# Patient Record
Sex: Female | Born: 1940 | Race: White | Hispanic: Refuse to answer | Marital: Married | Smoking: Former smoker
Health system: Southern US, Community
[De-identification: ages and names within clinical notes are randomized; demographics above are authoritative.]

## PROBLEM LIST (undated history)

## (undated) DIAGNOSIS — I509 Heart failure, unspecified: Secondary | ICD-10-CM

## (undated) DIAGNOSIS — E0500 Thyrotoxicosis with diffuse goiter without thyrotoxic crisis or storm: Secondary | ICD-10-CM

## (undated) DIAGNOSIS — Z923 Personal history of irradiation: Secondary | ICD-10-CM

## (undated) HISTORY — PX: MASTECTOMY: SHX3

## (undated) HISTORY — PX: BREAST EXCISIONAL BIOPSY: SUR124

---

## 1993-05-19 HISTORY — PX: BREAST LUMPECTOMY: SHX2

## 2011-07-07 ENCOUNTER — Ambulatory Visit: Payer: Medicare PPO

## 2011-07-07 ENCOUNTER — Ambulatory Visit: Payer: Medicare PPO | Admitting: Family Medicine

## 2011-07-07 DIAGNOSIS — M79673 Pain in unspecified foot: Secondary | ICD-10-CM

## 2011-07-07 DIAGNOSIS — M7740 Metatarsalgia, unspecified foot: Secondary | ICD-10-CM

## 2011-07-07 DIAGNOSIS — M7750 Other enthesopathy of unspecified foot: Secondary | ICD-10-CM

## 2011-07-07 DIAGNOSIS — M79609 Pain in unspecified limb: Secondary | ICD-10-CM

## 2011-07-07 NOTE — Progress Notes (Signed)
  Subjective:    Patient ID: Carla Petersen, female    DOB: 1940/07/08, 71 y.o.   MRN: [id number]  HPI Carla Petersen is a 71 y.o. female C/o 1 week hx R mid-distal foot pain.  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  No know ninjury, but was sitting for prolonged times, and more walking than usual.  Noticed swelling in distal foot.  No wound/echymosis.  No hx gout.  No prior foot injury/surgery. 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  No calf pain/swelling, no chest pain, no dyspnea.  Tx: ice, elevation, tylenol, Alleve - min relief, but swelling less now than yesterday.      Review of Systems  Constitutional: Positive for activity change. Negative for fever.  Respiratory: Negative for shortness of breath.   Cardiovascular: Negative for chest pain, palpitations and leg swelling.  Musculoskeletal: Positive for joint swelling.  Skin: Negative for rash and wound.       Objective:   Physical Exam  Constitutional: She is oriented to person, place, and time. She appears well-developed and well-nourished.  HENT:  Head: Normocephalic and atraumatic.  Pulmonary/Chest: Effort normal.  Musculoskeletal: She exhibits edema and tenderness.       Right ankle: She exhibits swelling. She exhibits no deformity. tenderness. No lateral malleolus, no medial malleolus, no AITFL, no head of 5th metatarsal and no proximal fibula tenderness found.       Right lower leg: She exhibits no tenderness and no edema.       Feet:       No calf ttp, negative homans   Neurological: She is alert and oriented to person, place, and time. No sensory deficit.  Skin: Skin is warm and dry. No rash noted. No erythema. No pallor.  Psychiatric: She has a normal mood and affect. Her behavior is normal.   [facility] reading (PRIMARY) by  Dr. Neva Seat: R foot: negative      Assessment & Plan:  Carla Petersen is a 71 y.o. female With R midfoot pain/metatarsalgia.  Possible overuse vs early stress injury, vs DJD  flair. Has had slight improvement in swelling today.  Post op shoe, XR to overread.  Elevate, ice prn if swelling, otc alleve prn  Return to the clinic or go to the nearest emergency room if symptoms worsen or new symptoms occur.

## 2011-07-07 NOTE — Patient Instructions (Signed)
Elevate foot, wear postop shoe. Tylenol over the counter as needed - let me know if something stronger is needed.  Return to the clinic or go to the nearest emergency room if any of your symptoms worsen or new symptoms occur, including any chest pain or shortness of breath.

## 2011-07-09 ENCOUNTER — Telehealth: Payer: Self-pay | Admitting: Radiology

## 2011-07-09 NOTE — Telephone Encounter (Signed)
Message copied by Levon Hedger A on Wed Jul 09, 2011  4:10 PM ------      Message from: [place], [place] R      Created: Wed Jul 09, 2011  4:05 PM       Call pt - radiologist noted arthritis at big toe joint, but no fracture.  If not improving can follow up next week.

## 2011-07-09 NOTE — Telephone Encounter (Signed)
Gave pt results of xray. Pt understood.

## 2014-04-24 ENCOUNTER — Other Ambulatory Visit: Payer: Self-pay | Admitting: Internal Medicine

## 2014-04-24 DIAGNOSIS — R748 Abnormal levels of other serum enzymes: Secondary | ICD-10-CM

## 2014-05-01 ENCOUNTER — Ambulatory Visit
Admission: RE | Admit: 2014-05-01 | Discharge: 2014-05-01 | Disposition: A | Discharge status: Home / Self Care | Payer: Self-pay | Source: Ambulatory Visit | Attending: Internal Medicine | Admitting: Internal Medicine

## 2014-05-01 ENCOUNTER — Encounter (INDEPENDENT_AMBULATORY_CARE_PROVIDER_SITE_OTHER): Payer: Self-pay

## 2014-05-01 DIAGNOSIS — R748 Abnormal levels of other serum enzymes: Secondary | ICD-10-CM

## 2014-07-07 DIAGNOSIS — M1990 Unspecified osteoarthritis, unspecified site: Secondary | ICD-10-CM | POA: Diagnosis present

## 2014-07-07 DIAGNOSIS — M542 Cervicalgia: Secondary | ICD-10-CM | POA: Diagnosis not present

## 2014-07-07 DIAGNOSIS — S32402D Unspecified fracture of left acetabulum, subsequent encounter for fracture with routine healing: Secondary | ICD-10-CM | POA: Diagnosis not present

## 2014-07-07 DIAGNOSIS — S32402A Unspecified fracture of left acetabulum, initial encounter for closed fracture: Secondary | ICD-10-CM | POA: Diagnosis not present

## 2014-07-07 DIAGNOSIS — M79651 Pain in right thigh: Secondary | ICD-10-CM | POA: Diagnosis not present

## 2014-07-07 DIAGNOSIS — S2241XA Multiple fractures of ribs, right side, initial encounter for closed fracture: Secondary | ICD-10-CM | POA: Diagnosis not present

## 2014-07-07 DIAGNOSIS — R52 Pain, unspecified: Secondary | ICD-10-CM | POA: Diagnosis not present

## 2014-07-07 DIAGNOSIS — S2232XA Fracture of one rib, left side, initial encounter for closed fracture: Secondary | ICD-10-CM | POA: Diagnosis not present

## 2014-07-07 DIAGNOSIS — S32409A Unspecified fracture of unspecified acetabulum, initial encounter for closed fracture: Secondary | ICD-10-CM | POA: Diagnosis not present

## 2014-07-07 DIAGNOSIS — Z85038 Personal history of other malignant neoplasm of large intestine: Secondary | ICD-10-CM | POA: Diagnosis not present

## 2014-07-07 DIAGNOSIS — S022XXA Fracture of nasal bones, initial encounter for closed fracture: Secondary | ICD-10-CM | POA: Diagnosis not present

## 2014-07-07 DIAGNOSIS — S32502A Unspecified fracture of left pubis, initial encounter for closed fracture: Secondary | ICD-10-CM | POA: Diagnosis not present

## 2014-07-07 DIAGNOSIS — S0512XA Contusion of eyeball and orbital tissues, left eye, initial encounter: Secondary | ICD-10-CM | POA: Diagnosis present

## 2014-07-07 DIAGNOSIS — I10 Essential (primary) hypertension: Secondary | ICD-10-CM | POA: Diagnosis present

## 2014-07-07 DIAGNOSIS — S32482A Displaced dome fracture of left acetabulum, initial encounter for closed fracture: Secondary | ICD-10-CM | POA: Diagnosis not present

## 2014-07-07 DIAGNOSIS — S7012XA Contusion of left thigh, initial encounter: Secondary | ICD-10-CM | POA: Diagnosis present

## 2014-07-07 DIAGNOSIS — M810 Age-related osteoporosis without current pathological fracture: Secondary | ICD-10-CM | POA: Diagnosis present

## 2014-07-07 DIAGNOSIS — S0003XA Contusion of scalp, initial encounter: Secondary | ICD-10-CM | POA: Diagnosis present

## 2014-07-07 DIAGNOSIS — I517 Cardiomegaly: Secondary | ICD-10-CM | POA: Diagnosis not present

## 2014-07-07 DIAGNOSIS — S32509A Unspecified fracture of unspecified pubis, initial encounter for closed fracture: Secondary | ICD-10-CM | POA: Diagnosis not present

## 2014-07-07 DIAGNOSIS — D62 Acute posthemorrhagic anemia: Secondary | ICD-10-CM | POA: Diagnosis not present

## 2014-07-07 DIAGNOSIS — J342 Deviated nasal septum: Secondary | ICD-10-CM | POA: Diagnosis present

## 2014-07-07 DIAGNOSIS — S32592A Other specified fracture of left pubis, initial encounter for closed fracture: Secondary | ICD-10-CM | POA: Diagnosis not present

## 2014-07-07 DIAGNOSIS — S060X1A Concussion with loss of consciousness of 30 minutes or less, initial encounter: Secondary | ICD-10-CM | POA: Diagnosis not present

## 2014-07-07 DIAGNOSIS — E0500 Thyrotoxicosis with diffuse goiter without thyrotoxic crisis or storm: Secondary | ICD-10-CM | POA: Diagnosis not present

## 2014-07-07 DIAGNOSIS — S0990XA Unspecified injury of head, initial encounter: Secondary | ICD-10-CM | POA: Diagnosis not present

## 2014-07-07 DIAGNOSIS — Z853 Personal history of malignant neoplasm of breast: Secondary | ICD-10-CM | POA: Diagnosis not present

## 2014-07-07 DIAGNOSIS — F17201 Nicotine dependence, unspecified, in remission: Secondary | ICD-10-CM | POA: Diagnosis present

## 2014-07-07 DIAGNOSIS — D500 Iron deficiency anemia secondary to blood loss (chronic): Secondary | ICD-10-CM | POA: Diagnosis not present

## 2014-07-07 DIAGNOSIS — S2239XA Fracture of one rib, unspecified side, initial encounter for closed fracture: Secondary | ICD-10-CM | POA: Diagnosis not present

## 2014-07-07 DIAGNOSIS — S2243XD Multiple fractures of ribs, bilateral, subsequent encounter for fracture with routine healing: Secondary | ICD-10-CM | POA: Diagnosis not present

## 2014-07-07 DIAGNOSIS — S0181XA Laceration without foreign body of other part of head, initial encounter: Secondary | ICD-10-CM | POA: Diagnosis not present

## 2014-07-07 DIAGNOSIS — S060X1D Concussion with loss of consciousness of 30 minutes or less, subsequent encounter: Secondary | ICD-10-CM | POA: Diagnosis not present

## 2014-07-07 DIAGNOSIS — S2243XA Multiple fractures of ribs, bilateral, initial encounter for closed fracture: Secondary | ICD-10-CM | POA: Diagnosis not present

## 2014-07-07 DIAGNOSIS — K5900 Constipation, unspecified: Secondary | ICD-10-CM | POA: Diagnosis present

## 2014-07-07 DIAGNOSIS — S0180XA Unspecified open wound of other part of head, initial encounter: Secondary | ICD-10-CM | POA: Diagnosis not present

## 2014-07-07 DIAGNOSIS — H3530 Unspecified macular degeneration: Secondary | ICD-10-CM | POA: Diagnosis present

## 2014-07-07 DIAGNOSIS — S3210XA Unspecified fracture of sacrum, initial encounter for closed fracture: Secondary | ICD-10-CM | POA: Diagnosis not present

## 2014-07-07 DIAGNOSIS — J9811 Atelectasis: Secondary | ICD-10-CM | POA: Diagnosis not present

## 2014-07-07 DIAGNOSIS — S3289XB Fracture of other parts of pelvis, initial encounter for open fracture: Secondary | ICD-10-CM | POA: Diagnosis not present

## 2014-07-07 DIAGNOSIS — M1612 Unilateral primary osteoarthritis, left hip: Secondary | ICD-10-CM | POA: Diagnosis not present

## 2014-07-07 DIAGNOSIS — S022XXD Fracture of nasal bones, subsequent encounter for fracture with routine healing: Secondary | ICD-10-CM | POA: Diagnosis not present

## 2014-07-07 DIAGNOSIS — S32492A Other specified fracture of left acetabulum, initial encounter for closed fracture: Secondary | ICD-10-CM | POA: Diagnosis not present

## 2014-07-07 DIAGNOSIS — S2242XA Multiple fractures of ribs, left side, initial encounter for closed fracture: Secondary | ICD-10-CM | POA: Diagnosis not present

## 2014-07-07 DIAGNOSIS — M62831 Muscle spasm of calf: Secondary | ICD-10-CM | POA: Diagnosis present

## 2014-07-07 DIAGNOSIS — J90 Pleural effusion, not elsewhere classified: Secondary | ICD-10-CM | POA: Diagnosis not present

## 2014-07-07 DIAGNOSIS — F0781 Postconcussional syndrome: Secondary | ICD-10-CM | POA: Diagnosis not present

## 2014-07-07 DIAGNOSIS — S7292XA Unspecified fracture of left femur, initial encounter for closed fracture: Secondary | ICD-10-CM | POA: Diagnosis not present

## 2014-07-07 DIAGNOSIS — Z23 Encounter for immunization: Secondary | ICD-10-CM | POA: Diagnosis not present

## 2014-07-07 DIAGNOSIS — T1490 Injury, unspecified: Secondary | ICD-10-CM | POA: Diagnosis not present

## 2014-07-07 DIAGNOSIS — S7011XA Contusion of right thigh, initial encounter: Secondary | ICD-10-CM | POA: Diagnosis present

## 2014-07-07 DIAGNOSIS — Z79899 Other long term (current) drug therapy: Secondary | ICD-10-CM | POA: Diagnosis not present

## 2014-07-07 DIAGNOSIS — S0511XA Contusion of eyeball and orbital tissues, right eye, initial encounter: Secondary | ICD-10-CM | POA: Diagnosis present

## 2014-07-14 DIAGNOSIS — Z7409 Other reduced mobility: Secondary | ICD-10-CM | POA: Diagnosis not present

## 2014-07-14 DIAGNOSIS — S0292XA Unspecified fracture of facial bones, initial encounter for closed fracture: Secondary | ICD-10-CM | POA: Diagnosis not present

## 2014-07-14 DIAGNOSIS — M545 Low back pain: Secondary | ICD-10-CM | POA: Diagnosis not present

## 2014-07-14 DIAGNOSIS — S06301D Unspecified focal traumatic brain injury with loss of consciousness of 30 minutes or less, subsequent encounter: Secondary | ICD-10-CM | POA: Diagnosis not present

## 2014-07-14 DIAGNOSIS — S7292XA Unspecified fracture of left femur, initial encounter for closed fracture: Secondary | ICD-10-CM | POA: Diagnosis not present

## 2014-07-14 DIAGNOSIS — S0292XD Unspecified fracture of facial bones, subsequent encounter for fracture with routine healing: Secondary | ICD-10-CM | POA: Diagnosis not present

## 2014-07-14 DIAGNOSIS — S060X1D Concussion with loss of consciousness of 30 minutes or less, subsequent encounter: Secondary | ICD-10-CM | POA: Diagnosis not present

## 2014-07-14 DIAGNOSIS — F419 Anxiety disorder, unspecified: Secondary | ICD-10-CM | POA: Diagnosis not present

## 2014-07-14 DIAGNOSIS — S0003XD Contusion of scalp, subsequent encounter: Secondary | ICD-10-CM | POA: Diagnosis not present

## 2014-07-14 DIAGNOSIS — S2243XA Multiple fractures of ribs, bilateral, initial encounter for closed fracture: Secondary | ICD-10-CM | POA: Diagnosis not present

## 2014-07-14 DIAGNOSIS — S32592A Other specified fracture of left pubis, initial encounter for closed fracture: Secondary | ICD-10-CM | POA: Diagnosis not present

## 2014-07-14 DIAGNOSIS — F0781 Postconcussional syndrome: Secondary | ICD-10-CM | POA: Diagnosis present

## 2014-07-14 DIAGNOSIS — S0181XA Laceration without foreign body of other part of head, initial encounter: Secondary | ICD-10-CM | POA: Diagnosis not present

## 2014-07-14 DIAGNOSIS — S060X0A Concussion without loss of consciousness, initial encounter: Secondary | ICD-10-CM | POA: Diagnosis not present

## 2014-07-14 DIAGNOSIS — K5900 Constipation, unspecified: Secondary | ICD-10-CM | POA: Diagnosis not present

## 2014-07-14 DIAGNOSIS — S060X9A Concussion with loss of consciousness of unspecified duration, initial encounter: Secondary | ICD-10-CM | POA: Diagnosis not present

## 2014-07-14 DIAGNOSIS — D649 Anemia, unspecified: Secondary | ICD-10-CM | POA: Diagnosis not present

## 2014-07-14 DIAGNOSIS — S3282XA Multiple fractures of pelvis without disruption of pelvic ring, initial encounter for closed fracture: Secondary | ICD-10-CM | POA: Diagnosis not present

## 2014-07-14 DIAGNOSIS — S32402D Unspecified fracture of left acetabulum, subsequent encounter for fracture with routine healing: Secondary | ICD-10-CM | POA: Diagnosis not present

## 2014-07-14 DIAGNOSIS — M533 Sacrococcygeal disorders, not elsewhere classified: Secondary | ICD-10-CM | POA: Diagnosis not present

## 2014-07-14 DIAGNOSIS — T07 Unspecified multiple injuries: Secondary | ICD-10-CM | POA: Diagnosis not present

## 2014-07-14 DIAGNOSIS — S0181XD Laceration without foreign body of other part of head, subsequent encounter: Secondary | ICD-10-CM | POA: Diagnosis not present

## 2014-07-14 DIAGNOSIS — D500 Iron deficiency anemia secondary to blood loss (chronic): Secondary | ICD-10-CM | POA: Diagnosis present

## 2014-07-14 DIAGNOSIS — R5383 Other fatigue: Secondary | ICD-10-CM | POA: Diagnosis not present

## 2014-07-14 DIAGNOSIS — S2243XD Multiple fractures of ribs, bilateral, subsequent encounter for fracture with routine healing: Secondary | ICD-10-CM | POA: Diagnosis not present

## 2014-07-14 DIAGNOSIS — E663 Overweight: Secondary | ICD-10-CM | POA: Diagnosis present

## 2014-07-14 DIAGNOSIS — E039 Hypothyroidism, unspecified: Secondary | ICD-10-CM | POA: Diagnosis not present

## 2014-07-14 DIAGNOSIS — S7292XD Unspecified fracture of left femur, subsequent encounter for closed fracture with routine healing: Secondary | ICD-10-CM | POA: Diagnosis not present

## 2014-07-14 DIAGNOSIS — S022XXD Fracture of nasal bones, subsequent encounter for fracture with routine healing: Secondary | ICD-10-CM | POA: Diagnosis not present

## 2014-07-17 DIAGNOSIS — T07XXXA Unspecified multiple injuries, initial encounter: Secondary | ICD-10-CM | POA: Insufficient documentation

## 2014-08-07 DIAGNOSIS — S2249XA Multiple fractures of ribs, unspecified side, initial encounter for closed fracture: Secondary | ICD-10-CM | POA: Diagnosis not present

## 2014-08-10 DIAGNOSIS — S32492D Other specified fracture of left acetabulum, subsequent encounter for fracture with routine healing: Secondary | ICD-10-CM | POA: Diagnosis not present

## 2014-08-10 DIAGNOSIS — S32810D Multiple fractures of pelvis with stable disruption of pelvic ring, subsequent encounter for fracture with routine healing: Secondary | ICD-10-CM | POA: Diagnosis not present

## 2014-08-10 DIAGNOSIS — S32472D Displaced fracture of medial wall of left acetabulum, subsequent encounter for fracture with routine healing: Secondary | ICD-10-CM | POA: Diagnosis not present

## 2014-08-10 DIAGNOSIS — Z880 Allergy status to penicillin: Secondary | ICD-10-CM | POA: Diagnosis not present

## 2014-08-10 DIAGNOSIS — S32472A Displaced fracture of medial wall of left acetabulum, initial encounter for closed fracture: Secondary | ICD-10-CM | POA: Diagnosis not present

## 2014-08-10 DIAGNOSIS — Z79899 Other long term (current) drug therapy: Secondary | ICD-10-CM | POA: Diagnosis not present

## 2014-08-10 DIAGNOSIS — T07 Unspecified multiple injuries: Secondary | ICD-10-CM | POA: Diagnosis not present

## 2014-08-10 DIAGNOSIS — M47896 Other spondylosis, lumbar region: Secondary | ICD-10-CM | POA: Diagnosis not present

## 2014-08-10 DIAGNOSIS — I10 Essential (primary) hypertension: Secondary | ICD-10-CM | POA: Diagnosis not present

## 2014-08-10 DIAGNOSIS — M247 Protrusio acetabuli: Secondary | ICD-10-CM | POA: Diagnosis not present

## 2014-08-10 DIAGNOSIS — S329XXD Fracture of unspecified parts of lumbosacral spine and pelvis, subsequent encounter for fracture with routine healing: Secondary | ICD-10-CM | POA: Diagnosis not present

## 2014-08-14 DIAGNOSIS — C50912 Malignant neoplasm of unspecified site of left female breast: Secondary | ICD-10-CM | POA: Diagnosis not present

## 2014-08-14 DIAGNOSIS — I10 Essential (primary) hypertension: Secondary | ICD-10-CM | POA: Diagnosis not present

## 2014-08-14 DIAGNOSIS — F419 Anxiety disorder, unspecified: Secondary | ICD-10-CM | POA: Diagnosis not present

## 2014-08-14 DIAGNOSIS — C189 Malignant neoplasm of colon, unspecified: Secondary | ICD-10-CM | POA: Diagnosis not present

## 2014-08-14 DIAGNOSIS — M810 Age-related osteoporosis without current pathological fracture: Secondary | ICD-10-CM | POA: Diagnosis not present

## 2014-08-14 DIAGNOSIS — E039 Hypothyroidism, unspecified: Secondary | ICD-10-CM | POA: Diagnosis not present

## 2014-08-14 DIAGNOSIS — D509 Iron deficiency anemia, unspecified: Secondary | ICD-10-CM | POA: Diagnosis not present

## 2014-08-14 DIAGNOSIS — S72002A Fracture of unspecified part of neck of left femur, initial encounter for closed fracture: Secondary | ICD-10-CM | POA: Diagnosis not present

## 2014-08-23 DIAGNOSIS — H3531 Nonexudative age-related macular degeneration: Secondary | ICD-10-CM | POA: Diagnosis not present

## 2014-08-23 DIAGNOSIS — H43813 Vitreous degeneration, bilateral: Secondary | ICD-10-CM | POA: Diagnosis not present

## 2014-09-04 DIAGNOSIS — C50912 Malignant neoplasm of unspecified site of left female breast: Secondary | ICD-10-CM | POA: Diagnosis not present

## 2014-09-04 DIAGNOSIS — Z23 Encounter for immunization: Secondary | ICD-10-CM | POA: Diagnosis not present

## 2014-09-04 DIAGNOSIS — M810 Age-related osteoporosis without current pathological fracture: Secondary | ICD-10-CM | POA: Diagnosis not present

## 2014-09-04 DIAGNOSIS — E039 Hypothyroidism, unspecified: Secondary | ICD-10-CM | POA: Diagnosis not present

## 2014-09-04 DIAGNOSIS — D509 Iron deficiency anemia, unspecified: Secondary | ICD-10-CM | POA: Diagnosis not present

## 2014-09-04 DIAGNOSIS — I10 Essential (primary) hypertension: Secondary | ICD-10-CM | POA: Diagnosis not present

## 2014-09-06 DIAGNOSIS — T07 Unspecified multiple injuries: Secondary | ICD-10-CM | POA: Diagnosis not present

## 2014-09-06 DIAGNOSIS — S32472D Displaced fracture of medial wall of left acetabulum, subsequent encounter for fracture with routine healing: Secondary | ICD-10-CM | POA: Diagnosis not present

## 2014-09-06 DIAGNOSIS — S2249XD Multiple fractures of ribs, unspecified side, subsequent encounter for fracture with routine healing: Secondary | ICD-10-CM | POA: Diagnosis not present

## 2014-09-12 DIAGNOSIS — S3282XG Multiple fractures of pelvis without disruption of pelvic ring, subsequent encounter for fracture with delayed healing: Secondary | ICD-10-CM | POA: Diagnosis not present

## 2014-09-12 DIAGNOSIS — S32463D Displaced associated transverse-posterior fracture of unspecified acetabulum, subsequent encounter for fracture with routine healing: Secondary | ICD-10-CM | POA: Diagnosis not present

## 2014-09-12 DIAGNOSIS — S3210XD Unspecified fracture of sacrum, subsequent encounter for fracture with routine healing: Secondary | ICD-10-CM | POA: Diagnosis not present

## 2014-09-12 DIAGNOSIS — S32302D Unspecified fracture of left ilium, subsequent encounter for fracture with routine healing: Secondary | ICD-10-CM | POA: Diagnosis not present

## 2014-09-12 DIAGNOSIS — S32472A Displaced fracture of medial wall of left acetabulum, initial encounter for closed fracture: Secondary | ICD-10-CM | POA: Diagnosis not present

## 2014-09-21 DIAGNOSIS — S32472A Displaced fracture of medial wall of left acetabulum, initial encounter for closed fracture: Secondary | ICD-10-CM | POA: Diagnosis not present

## 2014-09-21 DIAGNOSIS — S32472D Displaced fracture of medial wall of left acetabulum, subsequent encounter for fracture with routine healing: Secondary | ICD-10-CM | POA: Diagnosis not present

## 2014-09-28 DIAGNOSIS — Z01810 Encounter for preprocedural cardiovascular examination: Secondary | ICD-10-CM | POA: Diagnosis not present

## 2014-09-28 DIAGNOSIS — I10 Essential (primary) hypertension: Secondary | ICD-10-CM | POA: Diagnosis not present

## 2014-10-03 DIAGNOSIS — M1990 Unspecified osteoarthritis, unspecified site: Secondary | ICD-10-CM | POA: Diagnosis present

## 2014-10-03 DIAGNOSIS — M1612 Unilateral primary osteoarthritis, left hip: Secondary | ICD-10-CM | POA: Diagnosis not present

## 2014-10-03 DIAGNOSIS — Z9660 Presence of unspecified orthopedic joint implant: Secondary | ICD-10-CM | POA: Diagnosis not present

## 2014-10-03 DIAGNOSIS — E039 Hypothyroidism, unspecified: Secondary | ICD-10-CM | POA: Diagnosis present

## 2014-10-03 DIAGNOSIS — M25552 Pain in left hip: Secondary | ICD-10-CM | POA: Diagnosis not present

## 2014-10-03 DIAGNOSIS — E611 Iron deficiency: Secondary | ICD-10-CM | POA: Diagnosis present

## 2014-10-03 DIAGNOSIS — Z471 Aftercare following joint replacement surgery: Secondary | ICD-10-CM | POA: Diagnosis not present

## 2014-10-03 DIAGNOSIS — Z9049 Acquired absence of other specified parts of digestive tract: Secondary | ICD-10-CM | POA: Diagnosis present

## 2014-10-03 DIAGNOSIS — M25551 Pain in right hip: Secondary | ICD-10-CM | POA: Diagnosis not present

## 2014-10-03 DIAGNOSIS — I10 Essential (primary) hypertension: Secondary | ICD-10-CM | POA: Diagnosis present

## 2014-10-03 DIAGNOSIS — Z880 Allergy status to penicillin: Secondary | ICD-10-CM | POA: Diagnosis not present

## 2014-10-03 DIAGNOSIS — Z96642 Presence of left artificial hip joint: Secondary | ICD-10-CM | POA: Diagnosis not present

## 2014-10-03 DIAGNOSIS — Z888 Allergy status to other drugs, medicaments and biological substances status: Secondary | ICD-10-CM | POA: Diagnosis not present

## 2014-10-03 DIAGNOSIS — S32472A Displaced fracture of medial wall of left acetabulum, initial encounter for closed fracture: Secondary | ICD-10-CM | POA: Diagnosis not present

## 2014-10-03 DIAGNOSIS — Z882 Allergy status to sulfonamides status: Secondary | ICD-10-CM | POA: Diagnosis not present

## 2014-10-03 DIAGNOSIS — F418 Other specified anxiety disorders: Secondary | ICD-10-CM | POA: Diagnosis present

## 2014-10-03 DIAGNOSIS — Z993 Dependence on wheelchair: Secondary | ICD-10-CM | POA: Diagnosis not present

## 2014-10-03 DIAGNOSIS — G8918 Other acute postprocedural pain: Secondary | ICD-10-CM | POA: Diagnosis not present

## 2014-10-03 DIAGNOSIS — Z96649 Presence of unspecified artificial hip joint: Secondary | ICD-10-CM | POA: Insufficient documentation

## 2014-10-03 DIAGNOSIS — M810 Age-related osteoporosis without current pathological fracture: Secondary | ICD-10-CM | POA: Diagnosis present

## 2014-10-03 DIAGNOSIS — Z9012 Acquired absence of left breast and nipple: Secondary | ICD-10-CM | POA: Diagnosis present

## 2014-10-03 DIAGNOSIS — Z853 Personal history of malignant neoplasm of breast: Secondary | ICD-10-CM | POA: Diagnosis not present

## 2014-10-03 DIAGNOSIS — Z87891 Personal history of nicotine dependence: Secondary | ICD-10-CM | POA: Diagnosis not present

## 2014-10-03 DIAGNOSIS — D62 Acute posthemorrhagic anemia: Secondary | ICD-10-CM | POA: Diagnosis not present

## 2014-10-03 DIAGNOSIS — Z85038 Personal history of other malignant neoplasm of large intestine: Secondary | ICD-10-CM | POA: Diagnosis not present

## 2014-10-08 DIAGNOSIS — Z471 Aftercare following joint replacement surgery: Secondary | ICD-10-CM | POA: Diagnosis not present

## 2014-10-08 DIAGNOSIS — M1990 Unspecified osteoarthritis, unspecified site: Secondary | ICD-10-CM | POA: Diagnosis not present

## 2014-10-08 DIAGNOSIS — Z96642 Presence of left artificial hip joint: Secondary | ICD-10-CM | POA: Diagnosis not present

## 2014-10-08 DIAGNOSIS — M47896 Other spondylosis, lumbar region: Secondary | ICD-10-CM | POA: Diagnosis not present

## 2014-10-08 DIAGNOSIS — I10 Essential (primary) hypertension: Secondary | ICD-10-CM | POA: Diagnosis not present

## 2014-10-08 DIAGNOSIS — M24561 Contracture, right knee: Secondary | ICD-10-CM | POA: Diagnosis not present

## 2014-10-10 DIAGNOSIS — Z471 Aftercare following joint replacement surgery: Secondary | ICD-10-CM | POA: Diagnosis not present

## 2014-10-10 DIAGNOSIS — M1990 Unspecified osteoarthritis, unspecified site: Secondary | ICD-10-CM | POA: Diagnosis not present

## 2014-10-10 DIAGNOSIS — I10 Essential (primary) hypertension: Secondary | ICD-10-CM | POA: Diagnosis not present

## 2014-10-10 DIAGNOSIS — M47896 Other spondylosis, lumbar region: Secondary | ICD-10-CM | POA: Diagnosis not present

## 2014-10-10 DIAGNOSIS — Z96642 Presence of left artificial hip joint: Secondary | ICD-10-CM | POA: Diagnosis not present

## 2014-10-10 DIAGNOSIS — M24561 Contracture, right knee: Secondary | ICD-10-CM | POA: Diagnosis not present

## 2014-10-13 DIAGNOSIS — M24561 Contracture, right knee: Secondary | ICD-10-CM | POA: Diagnosis not present

## 2014-10-13 DIAGNOSIS — Z96642 Presence of left artificial hip joint: Secondary | ICD-10-CM | POA: Diagnosis not present

## 2014-10-13 DIAGNOSIS — I10 Essential (primary) hypertension: Secondary | ICD-10-CM | POA: Diagnosis not present

## 2014-10-13 DIAGNOSIS — M47896 Other spondylosis, lumbar region: Secondary | ICD-10-CM | POA: Diagnosis not present

## 2014-10-13 DIAGNOSIS — Z471 Aftercare following joint replacement surgery: Secondary | ICD-10-CM | POA: Diagnosis not present

## 2014-10-13 DIAGNOSIS — M1990 Unspecified osteoarthritis, unspecified site: Secondary | ICD-10-CM | POA: Diagnosis not present

## 2014-10-17 DIAGNOSIS — Z471 Aftercare following joint replacement surgery: Secondary | ICD-10-CM | POA: Diagnosis not present

## 2014-10-17 DIAGNOSIS — Z96642 Presence of left artificial hip joint: Secondary | ICD-10-CM | POA: Diagnosis not present

## 2014-10-17 DIAGNOSIS — Z882 Allergy status to sulfonamides status: Secondary | ICD-10-CM | POA: Diagnosis not present

## 2014-10-17 DIAGNOSIS — Z87891 Personal history of nicotine dependence: Secondary | ICD-10-CM | POA: Diagnosis not present

## 2014-10-17 DIAGNOSIS — Z880 Allergy status to penicillin: Secondary | ICD-10-CM | POA: Diagnosis not present

## 2014-10-18 DIAGNOSIS — M1990 Unspecified osteoarthritis, unspecified site: Secondary | ICD-10-CM | POA: Diagnosis not present

## 2014-10-18 DIAGNOSIS — H3531 Nonexudative age-related macular degeneration: Secondary | ICD-10-CM | POA: Diagnosis not present

## 2014-10-18 DIAGNOSIS — I10 Essential (primary) hypertension: Secondary | ICD-10-CM | POA: Diagnosis not present

## 2014-10-18 DIAGNOSIS — M47896 Other spondylosis, lumbar region: Secondary | ICD-10-CM | POA: Diagnosis not present

## 2014-10-18 DIAGNOSIS — Z471 Aftercare following joint replacement surgery: Secondary | ICD-10-CM | POA: Diagnosis not present

## 2014-10-18 DIAGNOSIS — Z96642 Presence of left artificial hip joint: Secondary | ICD-10-CM | POA: Diagnosis not present

## 2014-10-18 DIAGNOSIS — M24561 Contracture, right knee: Secondary | ICD-10-CM | POA: Diagnosis not present

## 2014-10-19 DIAGNOSIS — M47896 Other spondylosis, lumbar region: Secondary | ICD-10-CM | POA: Diagnosis not present

## 2014-10-19 DIAGNOSIS — Z96642 Presence of left artificial hip joint: Secondary | ICD-10-CM | POA: Diagnosis not present

## 2014-10-19 DIAGNOSIS — Z471 Aftercare following joint replacement surgery: Secondary | ICD-10-CM | POA: Diagnosis not present

## 2014-10-19 DIAGNOSIS — M1990 Unspecified osteoarthritis, unspecified site: Secondary | ICD-10-CM | POA: Diagnosis not present

## 2014-10-19 DIAGNOSIS — M24561 Contracture, right knee: Secondary | ICD-10-CM | POA: Diagnosis not present

## 2014-10-19 DIAGNOSIS — I10 Essential (primary) hypertension: Secondary | ICD-10-CM | POA: Diagnosis not present

## 2014-10-20 DIAGNOSIS — Z471 Aftercare following joint replacement surgery: Secondary | ICD-10-CM | POA: Diagnosis not present

## 2014-10-20 DIAGNOSIS — M24561 Contracture, right knee: Secondary | ICD-10-CM | POA: Diagnosis not present

## 2014-10-20 DIAGNOSIS — I10 Essential (primary) hypertension: Secondary | ICD-10-CM | POA: Diagnosis not present

## 2014-10-20 DIAGNOSIS — M47896 Other spondylosis, lumbar region: Secondary | ICD-10-CM | POA: Diagnosis not present

## 2014-10-20 DIAGNOSIS — Z96642 Presence of left artificial hip joint: Secondary | ICD-10-CM | POA: Diagnosis not present

## 2014-10-20 DIAGNOSIS — M1990 Unspecified osteoarthritis, unspecified site: Secondary | ICD-10-CM | POA: Diagnosis not present

## 2014-10-25 DIAGNOSIS — M25652 Stiffness of left hip, not elsewhere classified: Secondary | ICD-10-CM | POA: Diagnosis not present

## 2014-10-25 DIAGNOSIS — R262 Difficulty in walking, not elsewhere classified: Secondary | ICD-10-CM | POA: Diagnosis not present

## 2014-10-25 DIAGNOSIS — R29898 Other symptoms and signs involving the musculoskeletal system: Secondary | ICD-10-CM | POA: Diagnosis not present

## 2014-10-25 DIAGNOSIS — Z9660 Presence of unspecified orthopedic joint implant: Secondary | ICD-10-CM | POA: Diagnosis not present

## 2014-10-26 DIAGNOSIS — H524 Presbyopia: Secondary | ICD-10-CM | POA: Diagnosis not present

## 2014-10-26 DIAGNOSIS — H31093 Other chorioretinal scars, bilateral: Secondary | ICD-10-CM | POA: Diagnosis not present

## 2014-10-26 DIAGNOSIS — Z96642 Presence of left artificial hip joint: Secondary | ICD-10-CM | POA: Diagnosis not present

## 2014-10-26 DIAGNOSIS — Z471 Aftercare following joint replacement surgery: Secondary | ICD-10-CM | POA: Diagnosis not present

## 2014-11-01 DIAGNOSIS — M25652 Stiffness of left hip, not elsewhere classified: Secondary | ICD-10-CM | POA: Diagnosis not present

## 2014-11-01 DIAGNOSIS — Z9660 Presence of unspecified orthopedic joint implant: Secondary | ICD-10-CM | POA: Diagnosis not present

## 2014-11-01 DIAGNOSIS — R262 Difficulty in walking, not elsewhere classified: Secondary | ICD-10-CM | POA: Diagnosis not present

## 2014-11-01 DIAGNOSIS — R29898 Other symptoms and signs involving the musculoskeletal system: Secondary | ICD-10-CM | POA: Diagnosis not present

## 2014-11-06 DIAGNOSIS — R262 Difficulty in walking, not elsewhere classified: Secondary | ICD-10-CM | POA: Diagnosis not present

## 2014-11-06 DIAGNOSIS — M25652 Stiffness of left hip, not elsewhere classified: Secondary | ICD-10-CM | POA: Diagnosis not present

## 2014-11-06 DIAGNOSIS — Z9660 Presence of unspecified orthopedic joint implant: Secondary | ICD-10-CM | POA: Diagnosis not present

## 2014-11-06 DIAGNOSIS — R29898 Other symptoms and signs involving the musculoskeletal system: Secondary | ICD-10-CM | POA: Diagnosis not present

## 2014-11-08 DIAGNOSIS — R29898 Other symptoms and signs involving the musculoskeletal system: Secondary | ICD-10-CM | POA: Diagnosis not present

## 2014-11-08 DIAGNOSIS — R262 Difficulty in walking, not elsewhere classified: Secondary | ICD-10-CM | POA: Diagnosis not present

## 2014-11-08 DIAGNOSIS — M25652 Stiffness of left hip, not elsewhere classified: Secondary | ICD-10-CM | POA: Diagnosis not present

## 2014-11-08 DIAGNOSIS — Z9660 Presence of unspecified orthopedic joint implant: Secondary | ICD-10-CM | POA: Diagnosis not present

## 2014-11-13 DIAGNOSIS — M25652 Stiffness of left hip, not elsewhere classified: Secondary | ICD-10-CM | POA: Diagnosis not present

## 2014-11-13 DIAGNOSIS — R29898 Other symptoms and signs involving the musculoskeletal system: Secondary | ICD-10-CM | POA: Diagnosis not present

## 2014-11-13 DIAGNOSIS — Z9660 Presence of unspecified orthopedic joint implant: Secondary | ICD-10-CM | POA: Diagnosis not present

## 2014-11-13 DIAGNOSIS — R262 Difficulty in walking, not elsewhere classified: Secondary | ICD-10-CM | POA: Diagnosis not present

## 2014-11-15 DIAGNOSIS — R29898 Other symptoms and signs involving the musculoskeletal system: Secondary | ICD-10-CM | POA: Diagnosis not present

## 2014-11-15 DIAGNOSIS — R262 Difficulty in walking, not elsewhere classified: Secondary | ICD-10-CM | POA: Diagnosis not present

## 2014-11-15 DIAGNOSIS — M25652 Stiffness of left hip, not elsewhere classified: Secondary | ICD-10-CM | POA: Diagnosis not present

## 2014-11-15 DIAGNOSIS — Z9660 Presence of unspecified orthopedic joint implant: Secondary | ICD-10-CM | POA: Diagnosis not present

## 2014-11-16 DIAGNOSIS — Z96642 Presence of left artificial hip joint: Secondary | ICD-10-CM | POA: Diagnosis not present

## 2014-11-16 DIAGNOSIS — S32472A Displaced fracture of medial wall of left acetabulum, initial encounter for closed fracture: Secondary | ICD-10-CM | POA: Diagnosis not present

## 2014-11-16 DIAGNOSIS — Z87891 Personal history of nicotine dependence: Secondary | ICD-10-CM | POA: Diagnosis not present

## 2014-11-16 DIAGNOSIS — Z880 Allergy status to penicillin: Secondary | ICD-10-CM | POA: Diagnosis not present

## 2014-11-16 DIAGNOSIS — Z471 Aftercare following joint replacement surgery: Secondary | ICD-10-CM | POA: Diagnosis not present

## 2014-11-16 DIAGNOSIS — Z882 Allergy status to sulfonamides status: Secondary | ICD-10-CM | POA: Diagnosis not present

## 2014-11-21 DIAGNOSIS — R29898 Other symptoms and signs involving the musculoskeletal system: Secondary | ICD-10-CM | POA: Diagnosis not present

## 2014-11-21 DIAGNOSIS — Z9660 Presence of unspecified orthopedic joint implant: Secondary | ICD-10-CM | POA: Diagnosis not present

## 2014-11-21 DIAGNOSIS — R262 Difficulty in walking, not elsewhere classified: Secondary | ICD-10-CM | POA: Diagnosis not present

## 2014-11-21 DIAGNOSIS — M25652 Stiffness of left hip, not elsewhere classified: Secondary | ICD-10-CM | POA: Diagnosis not present

## 2014-11-23 DIAGNOSIS — R29898 Other symptoms and signs involving the musculoskeletal system: Secondary | ICD-10-CM | POA: Diagnosis not present

## 2014-11-23 DIAGNOSIS — M25652 Stiffness of left hip, not elsewhere classified: Secondary | ICD-10-CM | POA: Diagnosis not present

## 2014-11-23 DIAGNOSIS — R262 Difficulty in walking, not elsewhere classified: Secondary | ICD-10-CM | POA: Diagnosis not present

## 2014-11-23 DIAGNOSIS — Z9660 Presence of unspecified orthopedic joint implant: Secondary | ICD-10-CM | POA: Diagnosis not present

## 2014-11-28 DIAGNOSIS — I10 Essential (primary) hypertension: Secondary | ICD-10-CM | POA: Diagnosis not present

## 2014-11-28 DIAGNOSIS — M810 Age-related osteoporosis without current pathological fracture: Secondary | ICD-10-CM | POA: Diagnosis not present

## 2014-11-28 DIAGNOSIS — Z1389 Encounter for screening for other disorder: Secondary | ICD-10-CM | POA: Diagnosis not present

## 2014-11-28 DIAGNOSIS — E039 Hypothyroidism, unspecified: Secondary | ICD-10-CM | POA: Diagnosis not present

## 2014-11-28 DIAGNOSIS — F419 Anxiety disorder, unspecified: Secondary | ICD-10-CM | POA: Diagnosis not present

## 2014-11-30 DIAGNOSIS — M25652 Stiffness of left hip, not elsewhere classified: Secondary | ICD-10-CM | POA: Diagnosis not present

## 2014-11-30 DIAGNOSIS — Z9660 Presence of unspecified orthopedic joint implant: Secondary | ICD-10-CM | POA: Diagnosis not present

## 2014-11-30 DIAGNOSIS — R262 Difficulty in walking, not elsewhere classified: Secondary | ICD-10-CM | POA: Diagnosis not present

## 2014-11-30 DIAGNOSIS — R29898 Other symptoms and signs involving the musculoskeletal system: Secondary | ICD-10-CM | POA: Diagnosis not present

## 2014-12-05 DIAGNOSIS — Z9660 Presence of unspecified orthopedic joint implant: Secondary | ICD-10-CM | POA: Diagnosis not present

## 2014-12-05 DIAGNOSIS — M25652 Stiffness of left hip, not elsewhere classified: Secondary | ICD-10-CM | POA: Diagnosis not present

## 2014-12-05 DIAGNOSIS — R29898 Other symptoms and signs involving the musculoskeletal system: Secondary | ICD-10-CM | POA: Diagnosis not present

## 2014-12-05 DIAGNOSIS — R262 Difficulty in walking, not elsewhere classified: Secondary | ICD-10-CM | POA: Diagnosis not present

## 2014-12-06 DIAGNOSIS — T07 Unspecified multiple injuries: Secondary | ICD-10-CM | POA: Diagnosis not present

## 2014-12-06 DIAGNOSIS — M25559 Pain in unspecified hip: Secondary | ICD-10-CM | POA: Diagnosis not present

## 2014-12-06 DIAGNOSIS — Z09 Encounter for follow-up examination after completed treatment for conditions other than malignant neoplasm: Secondary | ICD-10-CM | POA: Diagnosis not present

## 2014-12-06 DIAGNOSIS — R600 Localized edema: Secondary | ICD-10-CM | POA: Diagnosis not present

## 2014-12-07 DIAGNOSIS — M25652 Stiffness of left hip, not elsewhere classified: Secondary | ICD-10-CM | POA: Diagnosis not present

## 2014-12-07 DIAGNOSIS — Z9660 Presence of unspecified orthopedic joint implant: Secondary | ICD-10-CM | POA: Diagnosis not present

## 2014-12-07 DIAGNOSIS — R262 Difficulty in walking, not elsewhere classified: Secondary | ICD-10-CM | POA: Diagnosis not present

## 2014-12-07 DIAGNOSIS — R29898 Other symptoms and signs involving the musculoskeletal system: Secondary | ICD-10-CM | POA: Diagnosis not present

## 2014-12-13 DIAGNOSIS — R29898 Other symptoms and signs involving the musculoskeletal system: Secondary | ICD-10-CM | POA: Diagnosis not present

## 2014-12-13 DIAGNOSIS — R262 Difficulty in walking, not elsewhere classified: Secondary | ICD-10-CM | POA: Diagnosis not present

## 2014-12-13 DIAGNOSIS — M25652 Stiffness of left hip, not elsewhere classified: Secondary | ICD-10-CM | POA: Diagnosis not present

## 2014-12-13 DIAGNOSIS — Z9660 Presence of unspecified orthopedic joint implant: Secondary | ICD-10-CM | POA: Diagnosis not present

## 2014-12-20 DIAGNOSIS — M25652 Stiffness of left hip, not elsewhere classified: Secondary | ICD-10-CM | POA: Diagnosis not present

## 2014-12-20 DIAGNOSIS — Z96642 Presence of left artificial hip joint: Secondary | ICD-10-CM | POA: Diagnosis not present

## 2014-12-20 DIAGNOSIS — M6281 Muscle weakness (generalized): Secondary | ICD-10-CM | POA: Diagnosis not present

## 2014-12-20 DIAGNOSIS — R262 Difficulty in walking, not elsewhere classified: Secondary | ICD-10-CM | POA: Diagnosis not present

## 2014-12-21 DIAGNOSIS — M6281 Muscle weakness (generalized): Secondary | ICD-10-CM | POA: Diagnosis not present

## 2014-12-21 DIAGNOSIS — Z96642 Presence of left artificial hip joint: Secondary | ICD-10-CM | POA: Diagnosis not present

## 2014-12-21 DIAGNOSIS — R262 Difficulty in walking, not elsewhere classified: Secondary | ICD-10-CM | POA: Diagnosis not present

## 2014-12-21 DIAGNOSIS — M25652 Stiffness of left hip, not elsewhere classified: Secondary | ICD-10-CM | POA: Diagnosis not present

## 2014-12-26 DIAGNOSIS — M6281 Muscle weakness (generalized): Secondary | ICD-10-CM | POA: Diagnosis not present

## 2014-12-26 DIAGNOSIS — R262 Difficulty in walking, not elsewhere classified: Secondary | ICD-10-CM | POA: Diagnosis not present

## 2014-12-26 DIAGNOSIS — Z96642 Presence of left artificial hip joint: Secondary | ICD-10-CM | POA: Diagnosis not present

## 2014-12-26 DIAGNOSIS — M25652 Stiffness of left hip, not elsewhere classified: Secondary | ICD-10-CM | POA: Diagnosis not present

## 2014-12-28 DIAGNOSIS — R262 Difficulty in walking, not elsewhere classified: Secondary | ICD-10-CM | POA: Diagnosis not present

## 2014-12-28 DIAGNOSIS — Z96642 Presence of left artificial hip joint: Secondary | ICD-10-CM | POA: Diagnosis not present

## 2014-12-28 DIAGNOSIS — M6281 Muscle weakness (generalized): Secondary | ICD-10-CM | POA: Diagnosis not present

## 2014-12-28 DIAGNOSIS — R42 Dizziness and giddiness: Secondary | ICD-10-CM | POA: Diagnosis not present

## 2014-12-28 DIAGNOSIS — M25652 Stiffness of left hip, not elsewhere classified: Secondary | ICD-10-CM | POA: Diagnosis not present

## 2014-12-28 DIAGNOSIS — S022XXD Fracture of nasal bones, subsequent encounter for fracture with routine healing: Secondary | ICD-10-CM | POA: Diagnosis not present

## 2015-01-02 DIAGNOSIS — M6281 Muscle weakness (generalized): Secondary | ICD-10-CM | POA: Diagnosis not present

## 2015-01-02 DIAGNOSIS — Z96642 Presence of left artificial hip joint: Secondary | ICD-10-CM | POA: Diagnosis not present

## 2015-01-02 DIAGNOSIS — M25652 Stiffness of left hip, not elsewhere classified: Secondary | ICD-10-CM | POA: Diagnosis not present

## 2015-01-02 DIAGNOSIS — R262 Difficulty in walking, not elsewhere classified: Secondary | ICD-10-CM | POA: Diagnosis not present

## 2015-01-04 DIAGNOSIS — Z96642 Presence of left artificial hip joint: Secondary | ICD-10-CM | POA: Diagnosis not present

## 2015-01-04 DIAGNOSIS — M6281 Muscle weakness (generalized): Secondary | ICD-10-CM | POA: Diagnosis not present

## 2015-01-04 DIAGNOSIS — R262 Difficulty in walking, not elsewhere classified: Secondary | ICD-10-CM | POA: Diagnosis not present

## 2015-01-04 DIAGNOSIS — M25652 Stiffness of left hip, not elsewhere classified: Secondary | ICD-10-CM | POA: Diagnosis not present

## 2015-01-09 DIAGNOSIS — R262 Difficulty in walking, not elsewhere classified: Secondary | ICD-10-CM | POA: Diagnosis not present

## 2015-01-09 DIAGNOSIS — M25652 Stiffness of left hip, not elsewhere classified: Secondary | ICD-10-CM | POA: Diagnosis not present

## 2015-01-09 DIAGNOSIS — M6281 Muscle weakness (generalized): Secondary | ICD-10-CM | POA: Diagnosis not present

## 2015-01-09 DIAGNOSIS — Z96642 Presence of left artificial hip joint: Secondary | ICD-10-CM | POA: Diagnosis not present

## 2015-01-16 DIAGNOSIS — M6281 Muscle weakness (generalized): Secondary | ICD-10-CM | POA: Diagnosis not present

## 2015-01-16 DIAGNOSIS — R262 Difficulty in walking, not elsewhere classified: Secondary | ICD-10-CM | POA: Diagnosis not present

## 2015-01-16 DIAGNOSIS — Z96642 Presence of left artificial hip joint: Secondary | ICD-10-CM | POA: Diagnosis not present

## 2015-01-16 DIAGNOSIS — M25652 Stiffness of left hip, not elsewhere classified: Secondary | ICD-10-CM | POA: Diagnosis not present

## 2015-01-17 DIAGNOSIS — Z87891 Personal history of nicotine dependence: Secondary | ICD-10-CM | POA: Diagnosis not present

## 2015-01-17 DIAGNOSIS — I10 Essential (primary) hypertension: Secondary | ICD-10-CM | POA: Diagnosis not present

## 2015-01-17 DIAGNOSIS — Z96642 Presence of left artificial hip joint: Secondary | ICD-10-CM | POA: Diagnosis not present

## 2015-01-17 DIAGNOSIS — Z471 Aftercare following joint replacement surgery: Secondary | ICD-10-CM | POA: Diagnosis not present

## 2015-01-17 DIAGNOSIS — M170 Bilateral primary osteoarthritis of knee: Secondary | ICD-10-CM | POA: Diagnosis not present

## 2015-01-25 DIAGNOSIS — M6281 Muscle weakness (generalized): Secondary | ICD-10-CM | POA: Diagnosis not present

## 2015-01-25 DIAGNOSIS — R262 Difficulty in walking, not elsewhere classified: Secondary | ICD-10-CM | POA: Diagnosis not present

## 2015-01-25 DIAGNOSIS — M25652 Stiffness of left hip, not elsewhere classified: Secondary | ICD-10-CM | POA: Diagnosis not present

## 2015-01-25 DIAGNOSIS — H3531 Nonexudative age-related macular degeneration: Secondary | ICD-10-CM | POA: Diagnosis not present

## 2015-01-25 DIAGNOSIS — Z96642 Presence of left artificial hip joint: Secondary | ICD-10-CM | POA: Diagnosis not present

## 2015-01-30 DIAGNOSIS — M6281 Muscle weakness (generalized): Secondary | ICD-10-CM | POA: Diagnosis not present

## 2015-01-30 DIAGNOSIS — R262 Difficulty in walking, not elsewhere classified: Secondary | ICD-10-CM | POA: Diagnosis not present

## 2015-01-30 DIAGNOSIS — M25652 Stiffness of left hip, not elsewhere classified: Secondary | ICD-10-CM | POA: Diagnosis not present

## 2015-01-30 DIAGNOSIS — Z96642 Presence of left artificial hip joint: Secondary | ICD-10-CM | POA: Diagnosis not present

## 2015-02-06 DIAGNOSIS — M25652 Stiffness of left hip, not elsewhere classified: Secondary | ICD-10-CM | POA: Diagnosis not present

## 2015-02-06 DIAGNOSIS — R262 Difficulty in walking, not elsewhere classified: Secondary | ICD-10-CM | POA: Diagnosis not present

## 2015-02-06 DIAGNOSIS — Z96642 Presence of left artificial hip joint: Secondary | ICD-10-CM | POA: Diagnosis not present

## 2015-02-06 DIAGNOSIS — M6281 Muscle weakness (generalized): Secondary | ICD-10-CM | POA: Diagnosis not present

## 2015-02-21 DIAGNOSIS — Z23 Encounter for immunization: Secondary | ICD-10-CM | POA: Diagnosis not present

## 2015-02-28 DIAGNOSIS — D1723 Benign lipomatous neoplasm of skin and subcutaneous tissue of right leg: Secondary | ICD-10-CM | POA: Diagnosis not present

## 2015-02-28 DIAGNOSIS — R937 Abnormal findings on diagnostic imaging of other parts of musculoskeletal system: Secondary | ICD-10-CM | POA: Diagnosis not present

## 2015-03-07 DIAGNOSIS — I10 Essential (primary) hypertension: Secondary | ICD-10-CM | POA: Diagnosis not present

## 2015-03-07 DIAGNOSIS — Z7982 Long term (current) use of aspirin: Secondary | ICD-10-CM | POA: Diagnosis not present

## 2015-03-07 DIAGNOSIS — Z882 Allergy status to sulfonamides status: Secondary | ICD-10-CM | POA: Diagnosis not present

## 2015-03-07 DIAGNOSIS — T07 Unspecified multiple injuries: Secondary | ICD-10-CM | POA: Diagnosis not present

## 2015-03-07 DIAGNOSIS — Z09 Encounter for follow-up examination after completed treatment for conditions other than malignant neoplasm: Secondary | ICD-10-CM | POA: Diagnosis not present

## 2015-03-07 DIAGNOSIS — Z87891 Personal history of nicotine dependence: Secondary | ICD-10-CM | POA: Diagnosis not present

## 2015-03-07 DIAGNOSIS — Z880 Allergy status to penicillin: Secondary | ICD-10-CM | POA: Diagnosis not present

## 2015-04-02 DIAGNOSIS — I10 Essential (primary) hypertension: Secondary | ICD-10-CM | POA: Diagnosis not present

## 2015-04-02 DIAGNOSIS — E039 Hypothyroidism, unspecified: Secondary | ICD-10-CM | POA: Diagnosis not present

## 2015-04-02 DIAGNOSIS — C50912 Malignant neoplasm of unspecified site of left female breast: Secondary | ICD-10-CM | POA: Diagnosis not present

## 2015-04-02 DIAGNOSIS — D509 Iron deficiency anemia, unspecified: Secondary | ICD-10-CM | POA: Diagnosis not present

## 2015-04-20 DIAGNOSIS — H43813 Vitreous degeneration, bilateral: Secondary | ICD-10-CM | POA: Diagnosis not present

## 2015-04-20 DIAGNOSIS — H353112 Nonexudative age-related macular degeneration, right eye, intermediate dry stage: Secondary | ICD-10-CM | POA: Diagnosis not present

## 2015-04-20 DIAGNOSIS — H353111 Nonexudative age-related macular degeneration, right eye, early dry stage: Secondary | ICD-10-CM | POA: Diagnosis not present

## 2015-06-13 DIAGNOSIS — H26493 Other secondary cataract, bilateral: Secondary | ICD-10-CM | POA: Diagnosis not present

## 2015-07-02 DIAGNOSIS — C189 Malignant neoplasm of colon, unspecified: Secondary | ICD-10-CM | POA: Diagnosis not present

## 2015-07-02 DIAGNOSIS — R109 Unspecified abdominal pain: Secondary | ICD-10-CM | POA: Diagnosis not present

## 2015-07-12 DIAGNOSIS — H353132 Nonexudative age-related macular degeneration, bilateral, intermediate dry stage: Secondary | ICD-10-CM | POA: Diagnosis not present

## 2015-08-02 DIAGNOSIS — Z9089 Acquired absence of other organs: Secondary | ICD-10-CM | POA: Insufficient documentation

## 2015-08-02 DIAGNOSIS — H7091 Unspecified mastoiditis, right ear: Secondary | ICD-10-CM | POA: Insufficient documentation

## 2015-08-21 DIAGNOSIS — D126 Benign neoplasm of colon, unspecified: Secondary | ICD-10-CM | POA: Diagnosis not present

## 2015-08-21 DIAGNOSIS — K5730 Diverticulosis of large intestine without perforation or abscess without bleeding: Secondary | ICD-10-CM | POA: Diagnosis not present

## 2015-08-21 DIAGNOSIS — Z85038 Personal history of other malignant neoplasm of large intestine: Secondary | ICD-10-CM | POA: Diagnosis not present

## 2015-08-21 DIAGNOSIS — K640 First degree hemorrhoids: Secondary | ICD-10-CM | POA: Diagnosis not present

## 2015-08-21 DIAGNOSIS — D124 Benign neoplasm of descending colon: Secondary | ICD-10-CM | POA: Diagnosis not present

## 2015-09-05 ENCOUNTER — Other Ambulatory Visit: Payer: Self-pay | Admitting: Internal Medicine

## 2015-09-05 ENCOUNTER — Ambulatory Visit
Admission: RE | Admit: 2015-09-05 | Discharge: 2015-09-05 | Disposition: A | Discharge status: Home / Self Care | Payer: Medicare Other | Source: Ambulatory Visit | Attending: Internal Medicine | Admitting: Internal Medicine

## 2015-09-05 DIAGNOSIS — M79672 Pain in left foot: Secondary | ICD-10-CM

## 2015-09-05 DIAGNOSIS — M7989 Other specified soft tissue disorders: Secondary | ICD-10-CM | POA: Diagnosis not present

## 2015-09-05 DIAGNOSIS — M79605 Pain in left leg: Secondary | ICD-10-CM

## 2015-09-05 DIAGNOSIS — S9032XA Contusion of left foot, initial encounter: Secondary | ICD-10-CM | POA: Diagnosis not present

## 2015-09-05 DIAGNOSIS — R609 Edema, unspecified: Secondary | ICD-10-CM

## 2015-09-05 DIAGNOSIS — T148 Other injury of unspecified body region: Secondary | ICD-10-CM | POA: Diagnosis not present

## 2015-09-06 ENCOUNTER — Ambulatory Visit
Admission: RE | Admit: 2015-09-06 | Discharge: 2015-09-06 | Disposition: A | Discharge status: Home / Self Care | Payer: Medicare Other | Source: Ambulatory Visit | Attending: Internal Medicine | Admitting: Internal Medicine

## 2015-09-06 DIAGNOSIS — R609 Edema, unspecified: Secondary | ICD-10-CM

## 2015-09-06 DIAGNOSIS — S9032XA Contusion of left foot, initial encounter: Secondary | ICD-10-CM | POA: Diagnosis not present

## 2015-09-06 DIAGNOSIS — M79605 Pain in left leg: Secondary | ICD-10-CM

## 2015-09-11 DIAGNOSIS — Z853 Personal history of malignant neoplasm of breast: Secondary | ICD-10-CM | POA: Diagnosis not present

## 2015-09-11 DIAGNOSIS — Z1231 Encounter for screening mammogram for malignant neoplasm of breast: Secondary | ICD-10-CM | POA: Diagnosis not present

## 2015-10-08 DIAGNOSIS — E7800 Pure hypercholesterolemia, unspecified: Secondary | ICD-10-CM | POA: Diagnosis not present

## 2015-10-08 DIAGNOSIS — Z1389 Encounter for screening for other disorder: Secondary | ICD-10-CM | POA: Diagnosis not present

## 2015-10-08 DIAGNOSIS — M810 Age-related osteoporosis without current pathological fracture: Secondary | ICD-10-CM | POA: Diagnosis not present

## 2015-10-08 DIAGNOSIS — C189 Malignant neoplasm of colon, unspecified: Secondary | ICD-10-CM | POA: Diagnosis not present

## 2015-10-08 DIAGNOSIS — D509 Iron deficiency anemia, unspecified: Secondary | ICD-10-CM | POA: Diagnosis not present

## 2015-10-08 DIAGNOSIS — E039 Hypothyroidism, unspecified: Secondary | ICD-10-CM | POA: Diagnosis not present

## 2015-10-08 DIAGNOSIS — F419 Anxiety disorder, unspecified: Secondary | ICD-10-CM | POA: Diagnosis not present

## 2015-10-08 DIAGNOSIS — Z0000 Encounter for general adult medical examination without abnormal findings: Secondary | ICD-10-CM | POA: Diagnosis not present

## 2015-10-08 DIAGNOSIS — I10 Essential (primary) hypertension: Secondary | ICD-10-CM | POA: Diagnosis not present

## 2015-10-08 DIAGNOSIS — N390 Urinary tract infection, site not specified: Secondary | ICD-10-CM | POA: Diagnosis not present

## 2015-10-08 DIAGNOSIS — C50912 Malignant neoplasm of unspecified site of left female breast: Secondary | ICD-10-CM | POA: Diagnosis not present

## 2015-10-11 DIAGNOSIS — H353132 Nonexudative age-related macular degeneration, bilateral, intermediate dry stage: Secondary | ICD-10-CM | POA: Diagnosis not present

## 2015-10-11 DIAGNOSIS — H26493 Other secondary cataract, bilateral: Secondary | ICD-10-CM | POA: Diagnosis not present

## 2015-10-11 DIAGNOSIS — H524 Presbyopia: Secondary | ICD-10-CM | POA: Diagnosis not present

## 2015-10-17 DIAGNOSIS — Z471 Aftercare following joint replacement surgery: Secondary | ICD-10-CM | POA: Diagnosis not present

## 2015-10-17 DIAGNOSIS — Z880 Allergy status to penicillin: Secondary | ICD-10-CM | POA: Diagnosis not present

## 2015-10-17 DIAGNOSIS — Z882 Allergy status to sulfonamides status: Secondary | ICD-10-CM | POA: Diagnosis not present

## 2015-10-17 DIAGNOSIS — Z96642 Presence of left artificial hip joint: Secondary | ICD-10-CM | POA: Diagnosis not present

## 2015-10-17 DIAGNOSIS — Z87891 Personal history of nicotine dependence: Secondary | ICD-10-CM | POA: Diagnosis not present

## 2015-10-17 DIAGNOSIS — I10 Essential (primary) hypertension: Secondary | ICD-10-CM | POA: Diagnosis not present

## 2015-10-24 DIAGNOSIS — H353132 Nonexudative age-related macular degeneration, bilateral, intermediate dry stage: Secondary | ICD-10-CM | POA: Diagnosis not present

## 2015-10-24 DIAGNOSIS — H43813 Vitreous degeneration, bilateral: Secondary | ICD-10-CM | POA: Diagnosis not present

## 2016-02-13 DIAGNOSIS — Z23 Encounter for immunization: Secondary | ICD-10-CM | POA: Diagnosis not present

## 2016-02-15 DIAGNOSIS — N390 Urinary tract infection, site not specified: Secondary | ICD-10-CM | POA: Diagnosis not present

## 2016-04-15 DIAGNOSIS — I10 Essential (primary) hypertension: Secondary | ICD-10-CM | POA: Diagnosis not present

## 2016-04-15 DIAGNOSIS — E039 Hypothyroidism, unspecified: Secondary | ICD-10-CM | POA: Diagnosis not present

## 2016-04-15 DIAGNOSIS — C50912 Malignant neoplasm of unspecified site of left female breast: Secondary | ICD-10-CM | POA: Diagnosis not present

## 2016-04-15 DIAGNOSIS — M810 Age-related osteoporosis without current pathological fracture: Secondary | ICD-10-CM | POA: Diagnosis not present

## 2016-04-23 DIAGNOSIS — H353132 Nonexudative age-related macular degeneration, bilateral, intermediate dry stage: Secondary | ICD-10-CM | POA: Diagnosis not present

## 2016-04-23 DIAGNOSIS — H43813 Vitreous degeneration, bilateral: Secondary | ICD-10-CM | POA: Diagnosis not present

## 2016-07-08 ENCOUNTER — Encounter (HOSPITAL_COMMUNITY): Payer: Self-pay | Admitting: *Deleted

## 2016-07-08 ENCOUNTER — Emergency Department (HOSPITAL_COMMUNITY): Payer: Medicare Other

## 2016-07-08 ENCOUNTER — Emergency Department (HOSPITAL_COMMUNITY)
Admission: EM | Admit: 2016-07-08 | Discharge: 2016-07-08 | Disposition: A | Discharge status: Home / Self Care | Payer: Medicare Other | Attending: Emergency Medicine | Admitting: Emergency Medicine

## 2016-07-08 DIAGNOSIS — Z87891 Personal history of nicotine dependence: Secondary | ICD-10-CM | POA: Diagnosis not present

## 2016-07-08 DIAGNOSIS — Z7982 Long term (current) use of aspirin: Secondary | ICD-10-CM | POA: Diagnosis not present

## 2016-07-08 DIAGNOSIS — R000 Tachycardia, unspecified: Secondary | ICD-10-CM | POA: Diagnosis not present

## 2016-07-08 DIAGNOSIS — Z91040 Latex allergy status: Secondary | ICD-10-CM | POA: Insufficient documentation

## 2016-07-08 DIAGNOSIS — R079 Chest pain, unspecified: Secondary | ICD-10-CM | POA: Diagnosis not present

## 2016-07-08 DIAGNOSIS — M546 Pain in thoracic spine: Secondary | ICD-10-CM

## 2016-07-08 HISTORY — DX: Thyrotoxicosis with diffuse goiter without thyrotoxic crisis or storm: E05.00

## 2016-07-08 LAB — BASIC METABOLIC PANEL
Anion gap: 11 (ref 5–15)
BUN: 13 mg/dL (ref 6–20)
CHLORIDE: 101 mmol/L (ref 101–111)
CO2: 27 mmol/L (ref 22–32)
CREATININE: 0.9 mg/dL (ref 0.44–1.00)
Calcium: 9.6 mg/dL (ref 8.9–10.3)
GFR calc non Af Amer: >60 mL/min (ref >60)
Glucose, Bld: 145 mg/dL — ABNORMAL HIGH (ref 65–99)
POTASSIUM: 4.2 mmol/L (ref 3.5–5.1)
Sodium: 139 mmol/L (ref 135–145)

## 2016-07-08 LAB — CBC
HEMATOCRIT: 47.4 % — AB (ref 36.0–46.0)
HEMOGLOBIN: 16 g/dL — AB (ref 12.0–15.0)
MCH: 32.5 pg (ref 26.0–34.0)
MCHC: 33.8 g/dL (ref 30.0–36.0)
MCV: 96.3 fL (ref 78.0–100.0)
Platelets: 211 10*3/uL (ref 150–400)
RBC: 4.92 MIL/uL (ref 3.87–5.11)
RDW: 13.3 % (ref 11.5–15.5)
WBC: 7.8 10*3/uL (ref 4.0–10.5)

## 2016-07-08 LAB — I-STAT TROPONIN, ED
TROPONIN I, POC: 0 ng/mL (ref 0.00–0.08)
Troponin i, poc: 0 ng/mL (ref 0.00–0.08)

## 2016-07-08 NOTE — ED Triage Notes (Signed)
Pt reports recent HTN, has upper back discomfort and jaw discomfort. Denies SOB. ekg done at triage. No acute distress noted at this time.

## 2016-07-08 NOTE — ED Provider Notes (Signed)
[facility] DEPT Provider Note   CSN: [id number] Arrival date & time: 07/08/16  1422     History   Chief Complaint Chief Complaint  Patient presents with  . Back Pain  . Hypertension    HPI Carla Petersen is a 76 y.o. female.  HPI Patient presents with a dull pain in her upper back. Began a couple days ago. It is constant. Occasionally goes to the right neck also. Somewhat worse with movement. States it feels if she heard a muscle but does not do anything that would've hurt it. No numbness weakness. No headache. No Confusion. Not worse with exertion. States that she is previously had stress tests that were negative. No coronary artery disease history. No fevers.   Past Medical History:  Diagnosis Date  . Graves disease     There are no active problems to display for this patient.   Past Surgical History:  Procedure Laterality Date  . BREAST LUMPECTOMY      OB History    No data available       Home Medications    Prior to Admission medications   Medication Sig Start Date End Date Taking? Authorizing Provider  aspirin 81 MG tablet Take 81 mg by mouth daily.    Historical Provider, MD  calcium & magnesium carbonates (MYLANTA) 311-232 MG per tablet Take 1 tablet by mouth daily.    Historical Provider, MD  ferrous sulfate 220 (44 FE) MG/5ML solution Take 65 mg by mouth daily.    Historical Provider, MD  fish oil-omega-3 fatty acids 1000 MG capsule Take 300 mg by mouth daily.    Historical Provider, MD  levothyroxine (SYNTHROID, LEVOTHROID) 25 MCG tablet Take 25 mcg by mouth daily.    Historical Provider, MD  Multiple Vitamins-Minerals (CENTRUM SILVER ULTRA WOMENS) TABS Take 1 each by mouth daily.    Historical Provider, MD  Nutritional Supplements (VITAMIN D MAINTENANCE PO) Take 800 Units by mouth daily.    Historical Provider, MD  potassium chloride (MICRO-K) 10 MEQ CR capsule Take 10 mEq by mouth daily.    Historical Provider, MD  raloxifene (EVISTA) 60 MG  tablet Take 60 mg by mouth daily.    Historical Provider, MD  triamterene (DYRENIUM) 100 MG capsule Take 25 mg by mouth daily.    Historical Provider, MD    Family History History reviewed. No pertinent family history.  Social History Social History  Substance Use Topics  . Smoking status: Former Research scientist (life sciences)  . Smokeless tobacco: Former Systems developer    Quit date: 07/06/1993  . Alcohol use No     Allergies   Latex; Penicillins; and Sulfa antibiotics   Review of Systems Review of Systems  Constitutional: Negative for appetite change, fatigue and fever.  HENT: Negative for congestion and dental problem.   Respiratory: Negative for chest tightness and shortness of breath.   Cardiovascular: Negative for chest pain.  Gastrointestinal: Negative for abdominal distention.  Genitourinary: Negative for dysuria.  Musculoskeletal: Positive for back pain and neck pain.  Neurological: Negative for speech difficulty and numbness.  Hematological: Negative for adenopathy.  Psychiatric/Behavioral: The patient is nervous/anxious.      Physical Exam Updated Vital Signs BP 155/73 (BP Location: Right Arm)   Pulse 110   Temp 98.5 F (36.9 C) (Oral)   Resp 18   LMP 07/07/1983   SpO2 97%   Physical Exam  Constitutional: She appears well-developed.  HENT:  Head: Atraumatic.  Eyes: EOM are normal.  Neck: Neck supple.  Cardiovascular: Normal  rate.   Pulmonary/Chest: Effort normal.  Abdominal: Soft. There is no tenderness.  Musculoskeletal: She exhibits no edema.  Mild tenderness over trapezius, particularly on the left side. Neurovascular intact in bilateral hands.  Neurological: She is alert.  Skin: Skin is warm.     ED Treatments / Results  Labs (all labs ordered are listed, but only abnormal results are displayed) Labs Reviewed  BASIC METABOLIC PANEL - Abnormal; Notable for the following:       Result Value   Glucose, Bld 145 (*)    All other components within normal limits  CBC -  Abnormal; Notable for the following:    Hemoglobin 16.0 (*)    HCT 47.4 (*)    All other components within normal limits  I-STAT TROPOININ, ED  I-STAT TROPOININ, ED    EKG  EKG Interpretation  Date/Time:  Tuesday July 08 2016 14:33:30 EST Ventricular Rate:  110 PR Interval:  132 QRS Duration: 88 QT Interval:  338 QTC Calculation: 457 R Axis:   57 Text Interpretation:  Sinus tachycardia Biatrial enlargement Cannot rule out Anterior infarct , age undetermined Abnormal ECG Confirmed by Alvino Chapel  MD, Markeese Boyajian [phone]) on 07/08/2016 4:38:03 PM       Radiology Dg Chest 2 View  Result Date: 07/08/2016 CLINICAL DATA:  Chest pain for 3 days. EXAM: CHEST  2 VIEW COMPARISON:  None. FINDINGS: The heart size and mediastinal contours are within normal limits. Both lungs are clear. No evidence of pneumothorax or pleural effusion. Several old bilateral rib fracture deformities are noted. IMPRESSION: No active cardiopulmonary disease. Electronically Signed   By: Earle Gell M.D.   On: 07/08/2016 15:02    Procedures Procedures (including critical care time)  Medications Ordered in ED Medications - No data to display   Initial Impression / Assessment and Plan / ED Course  I have reviewed the triage vital signs and the nursing notes.  Pertinent labs & imaging results that were available during my care of the patient were reviewed by me and considered in my medical decision making (see chart for details).     Patient with upper back pain. Somewhat tender over muscles. EKG reassuring. Enzymes negative 2. X-ray reassuring. Doubt aortic dissection. Heart rate is normalized on my exam. Nonfocal exam. Doubt pulmonary embolism. Discharge home. Doubt this back pain is anginal equivalent.  Final Clinical Impressions(s) / ED Diagnoses   Final diagnoses:  Acute bilateral thoracic back pain    New Prescriptions New Prescriptions   No medications on file     Davonna Belling, MD 07/08/16  [phone]

## 2016-07-30 DIAGNOSIS — H353121 Nonexudative age-related macular degeneration, left eye, early dry stage: Secondary | ICD-10-CM | POA: Diagnosis not present

## 2016-07-30 DIAGNOSIS — H43813 Vitreous degeneration, bilateral: Secondary | ICD-10-CM | POA: Diagnosis not present

## 2016-07-30 DIAGNOSIS — H353211 Exudative age-related macular degeneration, right eye, with active choroidal neovascularization: Secondary | ICD-10-CM | POA: Diagnosis not present

## 2016-07-31 DIAGNOSIS — H353132 Nonexudative age-related macular degeneration, bilateral, intermediate dry stage: Secondary | ICD-10-CM | POA: Diagnosis not present

## 2016-07-31 DIAGNOSIS — H524 Presbyopia: Secondary | ICD-10-CM | POA: Diagnosis not present

## 2016-07-31 DIAGNOSIS — H26492 Other secondary cataract, left eye: Secondary | ICD-10-CM | POA: Diagnosis not present

## 2016-08-06 DIAGNOSIS — H353211 Exudative age-related macular degeneration, right eye, with active choroidal neovascularization: Secondary | ICD-10-CM | POA: Diagnosis not present

## 2016-09-05 DIAGNOSIS — H43813 Vitreous degeneration, bilateral: Secondary | ICD-10-CM | POA: Diagnosis not present

## 2016-09-05 DIAGNOSIS — H353121 Nonexudative age-related macular degeneration, left eye, early dry stage: Secondary | ICD-10-CM | POA: Diagnosis not present

## 2016-09-05 DIAGNOSIS — H353211 Exudative age-related macular degeneration, right eye, with active choroidal neovascularization: Secondary | ICD-10-CM | POA: Diagnosis not present

## 2016-10-09 DIAGNOSIS — M25569 Pain in unspecified knee: Secondary | ICD-10-CM | POA: Diagnosis not present

## 2016-10-09 DIAGNOSIS — E039 Hypothyroidism, unspecified: Secondary | ICD-10-CM | POA: Diagnosis not present

## 2016-10-09 DIAGNOSIS — F419 Anxiety disorder, unspecified: Secondary | ICD-10-CM | POA: Diagnosis not present

## 2016-10-09 DIAGNOSIS — C50912 Malignant neoplasm of unspecified site of left female breast: Secondary | ICD-10-CM | POA: Diagnosis not present

## 2016-10-09 DIAGNOSIS — R7309 Other abnormal glucose: Secondary | ICD-10-CM | POA: Diagnosis not present

## 2016-10-09 DIAGNOSIS — M810 Age-related osteoporosis without current pathological fracture: Secondary | ICD-10-CM | POA: Diagnosis not present

## 2016-10-09 DIAGNOSIS — C189 Malignant neoplasm of colon, unspecified: Secondary | ICD-10-CM | POA: Diagnosis not present

## 2016-10-09 DIAGNOSIS — D509 Iron deficiency anemia, unspecified: Secondary | ICD-10-CM | POA: Diagnosis not present

## 2016-10-09 DIAGNOSIS — Z1389 Encounter for screening for other disorder: Secondary | ICD-10-CM | POA: Diagnosis not present

## 2016-10-09 DIAGNOSIS — I10 Essential (primary) hypertension: Secondary | ICD-10-CM | POA: Diagnosis not present

## 2016-10-09 DIAGNOSIS — Z0000 Encounter for general adult medical examination without abnormal findings: Secondary | ICD-10-CM | POA: Diagnosis not present

## 2016-10-10 DIAGNOSIS — H43813 Vitreous degeneration, bilateral: Secondary | ICD-10-CM | POA: Diagnosis not present

## 2016-10-10 DIAGNOSIS — H353211 Exudative age-related macular degeneration, right eye, with active choroidal neovascularization: Secondary | ICD-10-CM | POA: Diagnosis not present

## 2016-10-10 DIAGNOSIS — H353121 Nonexudative age-related macular degeneration, left eye, early dry stage: Secondary | ICD-10-CM | POA: Diagnosis not present

## 2016-10-14 ENCOUNTER — Other Ambulatory Visit: Payer: Self-pay | Admitting: Internal Medicine

## 2016-10-14 DIAGNOSIS — Z1231 Encounter for screening mammogram for malignant neoplasm of breast: Secondary | ICD-10-CM

## 2016-10-15 DIAGNOSIS — M25062 Hemarthrosis, left knee: Secondary | ICD-10-CM | POA: Diagnosis not present

## 2016-10-22 DIAGNOSIS — Z96642 Presence of left artificial hip joint: Secondary | ICD-10-CM | POA: Diagnosis not present

## 2016-10-22 DIAGNOSIS — Z471 Aftercare following joint replacement surgery: Secondary | ICD-10-CM | POA: Diagnosis not present

## 2016-10-23 DIAGNOSIS — M25562 Pain in left knee: Secondary | ICD-10-CM | POA: Diagnosis not present

## 2016-10-30 DIAGNOSIS — M25562 Pain in left knee: Secondary | ICD-10-CM | POA: Diagnosis not present

## 2016-11-10 ENCOUNTER — Ambulatory Visit
Admission: RE | Admit: 2016-11-10 | Discharge: 2016-11-10 | Disposition: A | Discharge status: Home / Self Care | Payer: Medicare Other | Source: Ambulatory Visit | Attending: Internal Medicine | Admitting: Internal Medicine

## 2016-11-10 DIAGNOSIS — Z1231 Encounter for screening mammogram for malignant neoplasm of breast: Secondary | ICD-10-CM

## 2016-11-14 DIAGNOSIS — H353121 Nonexudative age-related macular degeneration, left eye, early dry stage: Secondary | ICD-10-CM | POA: Diagnosis not present

## 2016-11-14 DIAGNOSIS — H353211 Exudative age-related macular degeneration, right eye, with active choroidal neovascularization: Secondary | ICD-10-CM | POA: Diagnosis not present

## 2016-11-14 DIAGNOSIS — H43813 Vitreous degeneration, bilateral: Secondary | ICD-10-CM | POA: Diagnosis not present

## 2016-11-20 DIAGNOSIS — M25562 Pain in left knee: Secondary | ICD-10-CM | POA: Diagnosis not present

## 2016-11-26 DIAGNOSIS — M810 Age-related osteoporosis without current pathological fracture: Secondary | ICD-10-CM | POA: Diagnosis not present

## 2016-12-29 DIAGNOSIS — M25562 Pain in left knee: Secondary | ICD-10-CM | POA: Diagnosis not present

## 2016-12-30 DIAGNOSIS — H353211 Exudative age-related macular degeneration, right eye, with active choroidal neovascularization: Secondary | ICD-10-CM | POA: Diagnosis not present

## 2016-12-30 DIAGNOSIS — H353121 Nonexudative age-related macular degeneration, left eye, early dry stage: Secondary | ICD-10-CM | POA: Diagnosis not present

## 2016-12-30 DIAGNOSIS — H43813 Vitreous degeneration, bilateral: Secondary | ICD-10-CM | POA: Diagnosis not present

## 2017-02-10 DIAGNOSIS — H353211 Exudative age-related macular degeneration, right eye, with active choroidal neovascularization: Secondary | ICD-10-CM | POA: Diagnosis not present

## 2017-02-10 DIAGNOSIS — H43813 Vitreous degeneration, bilateral: Secondary | ICD-10-CM | POA: Diagnosis not present

## 2017-02-10 DIAGNOSIS — H353111 Nonexudative age-related macular degeneration, right eye, early dry stage: Secondary | ICD-10-CM | POA: Diagnosis not present

## 2017-02-16 DIAGNOSIS — Z23 Encounter for immunization: Secondary | ICD-10-CM | POA: Diagnosis not present

## 2017-03-24 DIAGNOSIS — H353211 Exudative age-related macular degeneration, right eye, with active choroidal neovascularization: Secondary | ICD-10-CM | POA: Diagnosis not present

## 2017-03-24 DIAGNOSIS — H353111 Nonexudative age-related macular degeneration, right eye, early dry stage: Secondary | ICD-10-CM | POA: Diagnosis not present

## 2017-03-24 DIAGNOSIS — H43813 Vitreous degeneration, bilateral: Secondary | ICD-10-CM | POA: Diagnosis not present

## 2017-04-20 DIAGNOSIS — F419 Anxiety disorder, unspecified: Secondary | ICD-10-CM | POA: Diagnosis not present

## 2017-04-20 DIAGNOSIS — D509 Iron deficiency anemia, unspecified: Secondary | ICD-10-CM | POA: Diagnosis not present

## 2017-04-20 DIAGNOSIS — E039 Hypothyroidism, unspecified: Secondary | ICD-10-CM | POA: Diagnosis not present

## 2017-04-20 DIAGNOSIS — C50912 Malignant neoplasm of unspecified site of left female breast: Secondary | ICD-10-CM | POA: Diagnosis not present

## 2017-04-20 DIAGNOSIS — L309 Dermatitis, unspecified: Secondary | ICD-10-CM | POA: Diagnosis not present

## 2017-04-20 DIAGNOSIS — M810 Age-related osteoporosis without current pathological fracture: Secondary | ICD-10-CM | POA: Diagnosis not present

## 2017-04-20 DIAGNOSIS — R7303 Prediabetes: Secondary | ICD-10-CM | POA: Diagnosis not present

## 2017-04-20 DIAGNOSIS — I10 Essential (primary) hypertension: Secondary | ICD-10-CM | POA: Diagnosis not present

## 2017-04-20 DIAGNOSIS — C189 Malignant neoplasm of colon, unspecified: Secondary | ICD-10-CM | POA: Diagnosis not present

## 2017-05-05 DIAGNOSIS — H43813 Vitreous degeneration, bilateral: Secondary | ICD-10-CM | POA: Diagnosis not present

## 2017-05-05 DIAGNOSIS — H353121 Nonexudative age-related macular degeneration, left eye, early dry stage: Secondary | ICD-10-CM | POA: Diagnosis not present

## 2017-05-05 DIAGNOSIS — H353211 Exudative age-related macular degeneration, right eye, with active choroidal neovascularization: Secondary | ICD-10-CM | POA: Diagnosis not present

## 2017-05-22 DIAGNOSIS — S61011A Laceration without foreign body of right thumb without damage to nail, initial encounter: Secondary | ICD-10-CM | POA: Diagnosis not present

## 2017-05-27 DIAGNOSIS — S61011A Laceration without foreign body of right thumb without damage to nail, initial encounter: Secondary | ICD-10-CM | POA: Diagnosis not present

## 2017-06-02 DIAGNOSIS — Z4802 Encounter for removal of sutures: Secondary | ICD-10-CM | POA: Diagnosis not present

## 2017-06-03 DIAGNOSIS — S61011A Laceration without foreign body of right thumb without damage to nail, initial encounter: Secondary | ICD-10-CM | POA: Insufficient documentation

## 2017-06-16 DIAGNOSIS — H353211 Exudative age-related macular degeneration, right eye, with active choroidal neovascularization: Secondary | ICD-10-CM | POA: Diagnosis not present

## 2017-06-16 DIAGNOSIS — H43813 Vitreous degeneration, bilateral: Secondary | ICD-10-CM | POA: Diagnosis not present

## 2017-06-16 DIAGNOSIS — H353121 Nonexudative age-related macular degeneration, left eye, early dry stage: Secondary | ICD-10-CM | POA: Diagnosis not present

## 2017-07-21 DIAGNOSIS — R300 Dysuria: Secondary | ICD-10-CM | POA: Diagnosis not present

## 2017-07-28 DIAGNOSIS — H43813 Vitreous degeneration, bilateral: Secondary | ICD-10-CM | POA: Diagnosis not present

## 2017-07-28 DIAGNOSIS — H353211 Exudative age-related macular degeneration, right eye, with active choroidal neovascularization: Secondary | ICD-10-CM | POA: Diagnosis not present

## 2017-07-28 DIAGNOSIS — H353121 Nonexudative age-related macular degeneration, left eye, early dry stage: Secondary | ICD-10-CM | POA: Diagnosis not present

## 2017-09-08 DIAGNOSIS — H353211 Exudative age-related macular degeneration, right eye, with active choroidal neovascularization: Secondary | ICD-10-CM | POA: Diagnosis not present

## 2017-09-08 DIAGNOSIS — H353121 Nonexudative age-related macular degeneration, left eye, early dry stage: Secondary | ICD-10-CM | POA: Diagnosis not present

## 2017-09-08 DIAGNOSIS — H43813 Vitreous degeneration, bilateral: Secondary | ICD-10-CM | POA: Diagnosis not present

## 2017-10-16 ENCOUNTER — Other Ambulatory Visit: Payer: Self-pay | Admitting: Internal Medicine

## 2017-10-16 DIAGNOSIS — M810 Age-related osteoporosis without current pathological fracture: Secondary | ICD-10-CM | POA: Diagnosis not present

## 2017-10-16 DIAGNOSIS — C189 Malignant neoplasm of colon, unspecified: Secondary | ICD-10-CM | POA: Diagnosis not present

## 2017-10-16 DIAGNOSIS — E039 Hypothyroidism, unspecified: Secondary | ICD-10-CM | POA: Diagnosis not present

## 2017-10-16 DIAGNOSIS — E782 Mixed hyperlipidemia: Secondary | ICD-10-CM | POA: Diagnosis not present

## 2017-10-16 DIAGNOSIS — Z0000 Encounter for general adult medical examination without abnormal findings: Secondary | ICD-10-CM | POA: Diagnosis not present

## 2017-10-16 DIAGNOSIS — F419 Anxiety disorder, unspecified: Secondary | ICD-10-CM | POA: Diagnosis not present

## 2017-10-16 DIAGNOSIS — D509 Iron deficiency anemia, unspecified: Secondary | ICD-10-CM | POA: Diagnosis not present

## 2017-10-16 DIAGNOSIS — Z1231 Encounter for screening mammogram for malignant neoplasm of breast: Secondary | ICD-10-CM

## 2017-10-16 DIAGNOSIS — I10 Essential (primary) hypertension: Secondary | ICD-10-CM | POA: Diagnosis not present

## 2017-10-16 DIAGNOSIS — Z1389 Encounter for screening for other disorder: Secondary | ICD-10-CM | POA: Diagnosis not present

## 2017-10-16 DIAGNOSIS — R7309 Other abnormal glucose: Secondary | ICD-10-CM | POA: Diagnosis not present

## 2017-10-16 DIAGNOSIS — C50912 Malignant neoplasm of unspecified site of left female breast: Secondary | ICD-10-CM | POA: Diagnosis not present

## 2017-10-20 DIAGNOSIS — H43813 Vitreous degeneration, bilateral: Secondary | ICD-10-CM | POA: Diagnosis not present

## 2017-10-20 DIAGNOSIS — H353122 Nonexudative age-related macular degeneration, left eye, intermediate dry stage: Secondary | ICD-10-CM | POA: Diagnosis not present

## 2017-10-20 DIAGNOSIS — H353211 Exudative age-related macular degeneration, right eye, with active choroidal neovascularization: Secondary | ICD-10-CM | POA: Diagnosis not present

## 2017-11-11 ENCOUNTER — Ambulatory Visit: Payer: Medicare Other

## 2017-12-08 DIAGNOSIS — H353211 Exudative age-related macular degeneration, right eye, with active choroidal neovascularization: Secondary | ICD-10-CM | POA: Diagnosis not present

## 2017-12-08 DIAGNOSIS — H43813 Vitreous degeneration, bilateral: Secondary | ICD-10-CM | POA: Diagnosis not present

## 2017-12-08 DIAGNOSIS — H353122 Nonexudative age-related macular degeneration, left eye, intermediate dry stage: Secondary | ICD-10-CM | POA: Diagnosis not present

## 2017-12-09 ENCOUNTER — Ambulatory Visit
Admission: RE | Admit: 2017-12-09 | Discharge: 2017-12-09 | Disposition: A | Discharge status: Home / Self Care | Payer: Medicare Other | Source: Ambulatory Visit | Attending: Internal Medicine | Admitting: Internal Medicine

## 2017-12-09 DIAGNOSIS — Z1231 Encounter for screening mammogram for malignant neoplasm of breast: Secondary | ICD-10-CM

## 2017-12-09 HISTORY — DX: Personal history of irradiation: Z92.3

## 2018-01-15 DIAGNOSIS — E039 Hypothyroidism, unspecified: Secondary | ICD-10-CM | POA: Diagnosis not present

## 2018-01-15 DIAGNOSIS — I10 Essential (primary) hypertension: Secondary | ICD-10-CM | POA: Diagnosis not present

## 2018-02-02 DIAGNOSIS — H43813 Vitreous degeneration, bilateral: Secondary | ICD-10-CM | POA: Diagnosis not present

## 2018-02-02 DIAGNOSIS — H353211 Exudative age-related macular degeneration, right eye, with active choroidal neovascularization: Secondary | ICD-10-CM | POA: Diagnosis not present

## 2018-02-02 DIAGNOSIS — H353122 Nonexudative age-related macular degeneration, left eye, intermediate dry stage: Secondary | ICD-10-CM | POA: Diagnosis not present

## 2018-02-03 DIAGNOSIS — Z23 Encounter for immunization: Secondary | ICD-10-CM | POA: Diagnosis not present

## 2018-03-30 DIAGNOSIS — H43813 Vitreous degeneration, bilateral: Secondary | ICD-10-CM | POA: Diagnosis not present

## 2018-03-30 DIAGNOSIS — H353211 Exudative age-related macular degeneration, right eye, with active choroidal neovascularization: Secondary | ICD-10-CM | POA: Diagnosis not present

## 2018-03-30 DIAGNOSIS — H353122 Nonexudative age-related macular degeneration, left eye, intermediate dry stage: Secondary | ICD-10-CM | POA: Diagnosis not present

## 2018-04-23 IMAGING — MG 2D DIGITAL SCREENING BILATERAL MAMMOGRAM WITH CAD AND ADJUNCT TO
8 of 12 series · 8 of 28 positions shown · non-contrast
Comparison: Previous exam(s).

CLINICAL DATA: Screening.

EXAM:
2D DIGITAL SCREENING BILATERAL MAMMOGRAM WITH CAD AND ADJUNCT TOMO

[R MLO synth-2D]
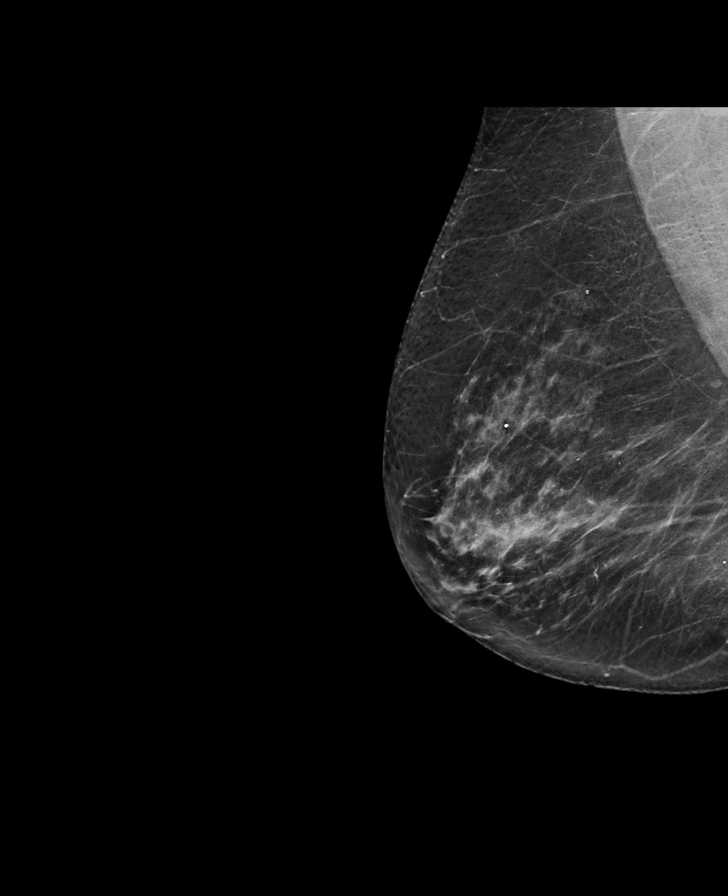

[L MLO synth-2D]
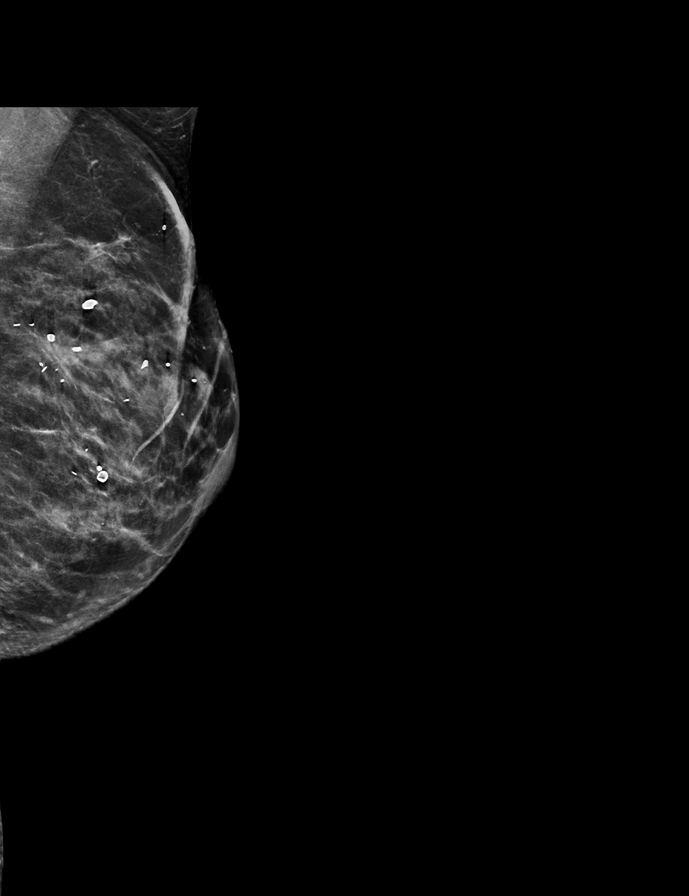

[L CC]
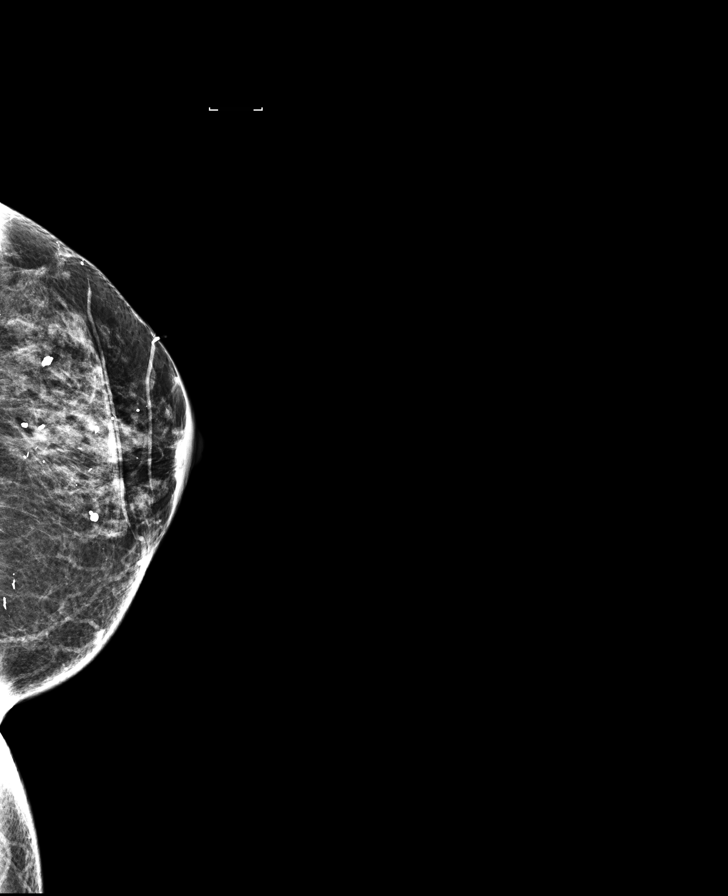

[R CC synth-2D]
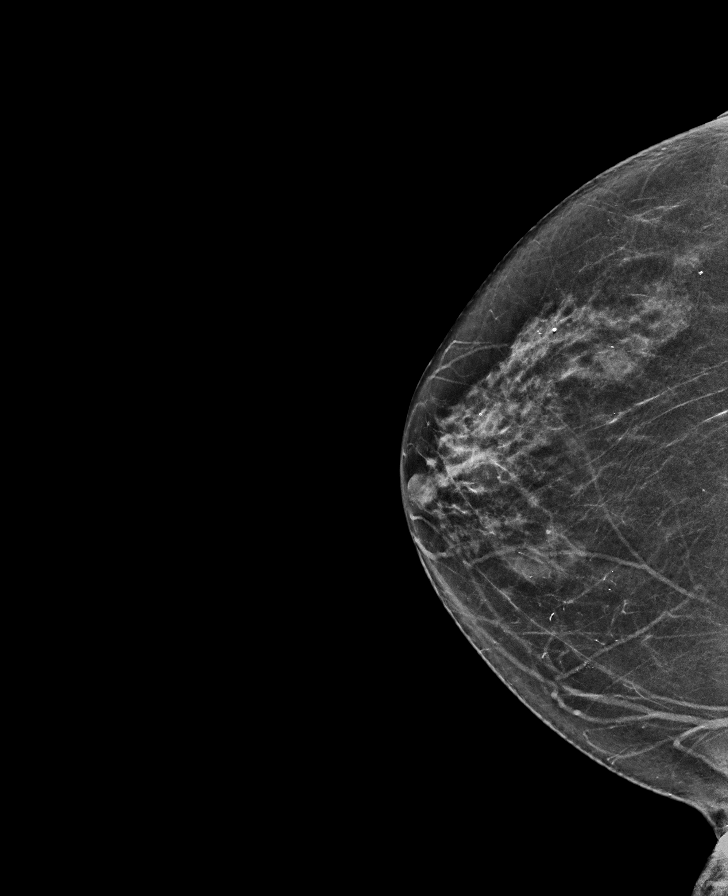

[R MLO]
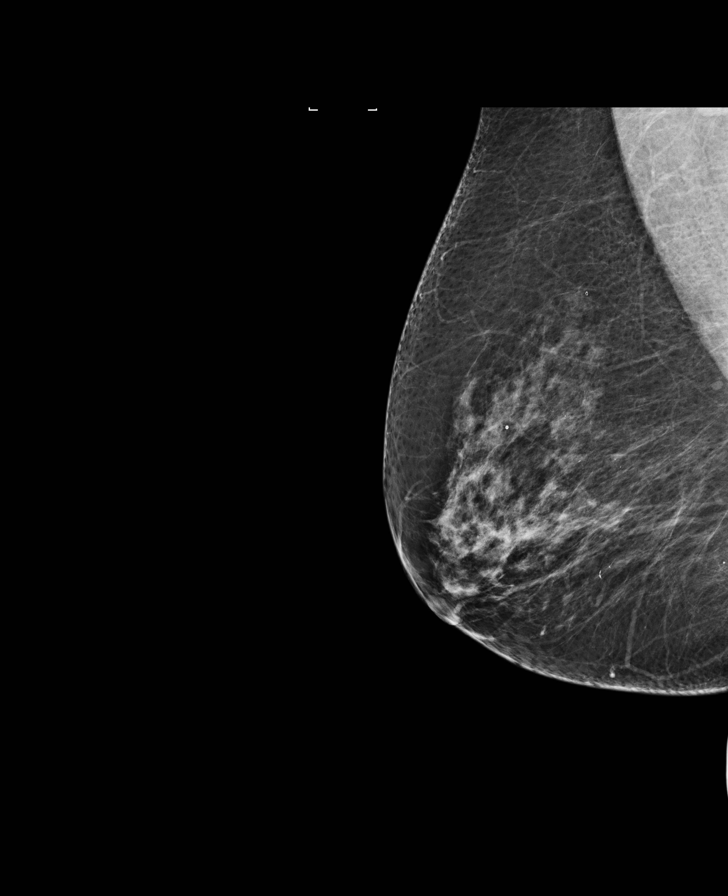

[R CC]
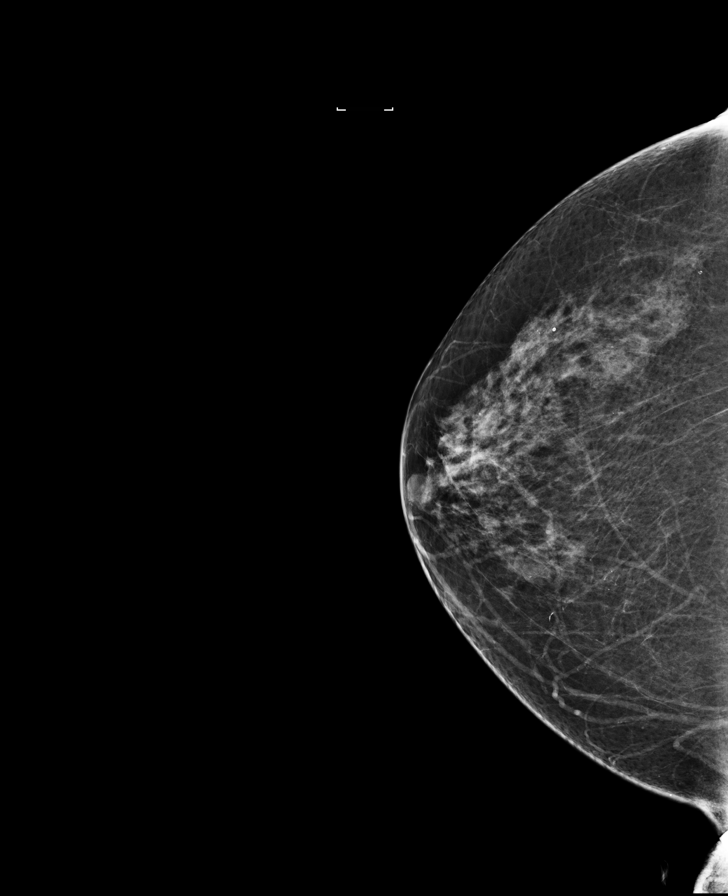

[L CC synth-2D]
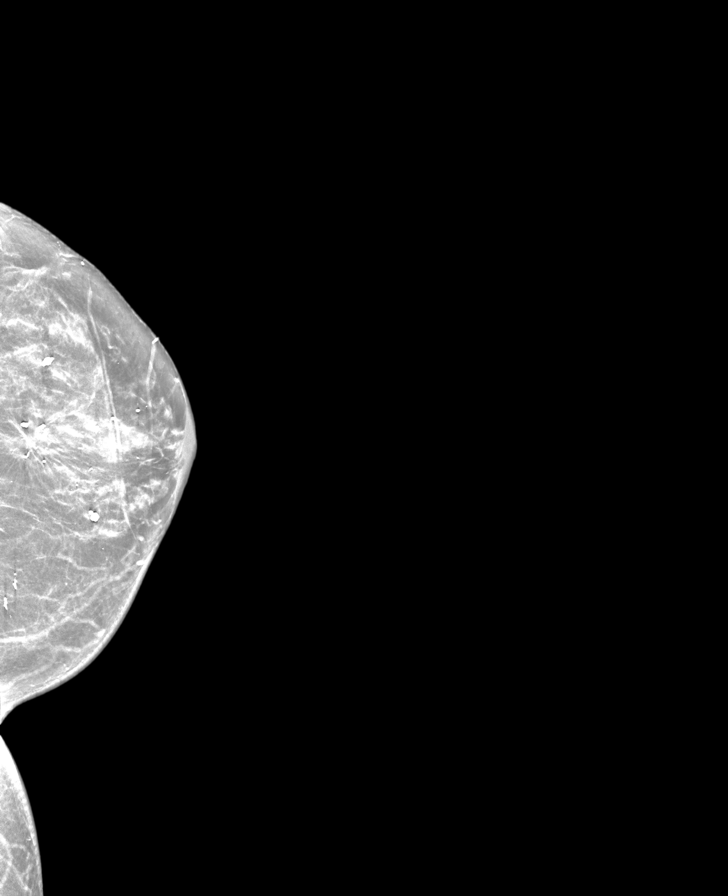

[L MLO]
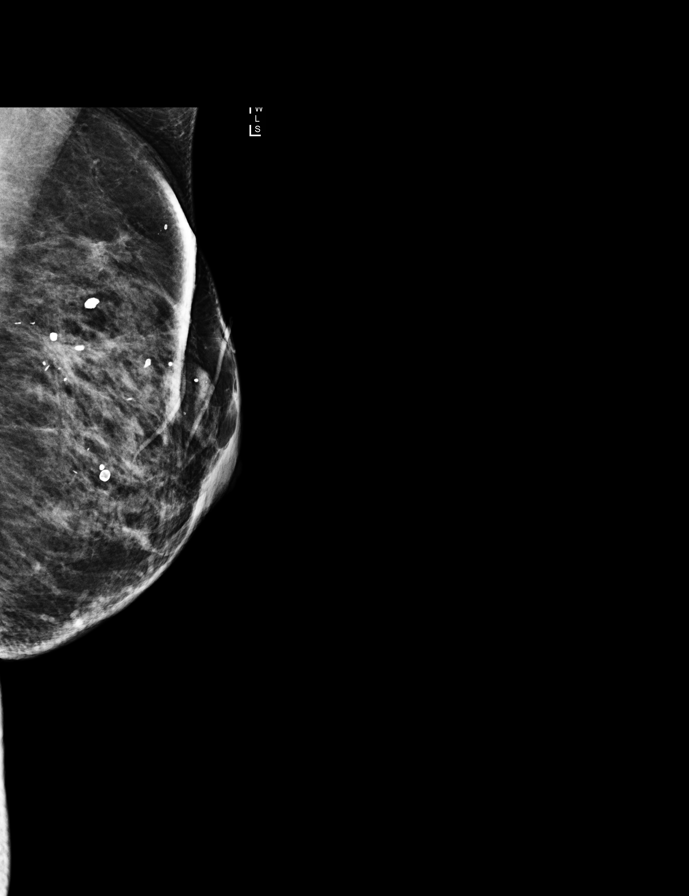

[8 of 28 positions shown; findings below may reference images not displayed]

ACR Breast Density Category c: The breast tissue is heterogeneously
dense, which may obscure small masses.
FINDINGS: There are no findings suspicious for malignancy. Images were
processed with CAD.
IMPRESSION: No mammographic evidence of malignancy. A result letter of this
screening mammogram will be mailed directly to the patient.

RECOMMENDATION:
Screening mammogram in one year. (Code:TN-[id number])

BI-RADS CATEGORY  1: Negative.

## 2018-05-25 DIAGNOSIS — C50912 Malignant neoplasm of unspecified site of left female breast: Secondary | ICD-10-CM | POA: Diagnosis not present

## 2018-05-25 DIAGNOSIS — M810 Age-related osteoporosis without current pathological fracture: Secondary | ICD-10-CM | POA: Diagnosis not present

## 2018-05-25 DIAGNOSIS — F419 Anxiety disorder, unspecified: Secondary | ICD-10-CM | POA: Diagnosis not present

## 2018-05-25 DIAGNOSIS — E039 Hypothyroidism, unspecified: Secondary | ICD-10-CM | POA: Diagnosis not present

## 2018-05-25 DIAGNOSIS — C189 Malignant neoplasm of colon, unspecified: Secondary | ICD-10-CM | POA: Diagnosis not present

## 2018-05-25 DIAGNOSIS — I10 Essential (primary) hypertension: Secondary | ICD-10-CM | POA: Diagnosis not present

## 2018-05-25 DIAGNOSIS — R7303 Prediabetes: Secondary | ICD-10-CM | POA: Diagnosis not present

## 2018-05-26 DIAGNOSIS — H353122 Nonexudative age-related macular degeneration, left eye, intermediate dry stage: Secondary | ICD-10-CM | POA: Diagnosis not present

## 2018-05-26 DIAGNOSIS — H43813 Vitreous degeneration, bilateral: Secondary | ICD-10-CM | POA: Diagnosis not present

## 2018-05-26 DIAGNOSIS — H353211 Exudative age-related macular degeneration, right eye, with active choroidal neovascularization: Secondary | ICD-10-CM | POA: Diagnosis not present

## 2018-06-10 DIAGNOSIS — M546 Pain in thoracic spine: Secondary | ICD-10-CM | POA: Diagnosis not present

## 2018-06-10 DIAGNOSIS — R000 Tachycardia, unspecified: Secondary | ICD-10-CM | POA: Diagnosis not present

## 2018-07-21 DIAGNOSIS — H353211 Exudative age-related macular degeneration, right eye, with active choroidal neovascularization: Secondary | ICD-10-CM | POA: Diagnosis not present

## 2018-07-21 DIAGNOSIS — H43813 Vitreous degeneration, bilateral: Secondary | ICD-10-CM | POA: Diagnosis not present

## 2018-07-21 DIAGNOSIS — H353122 Nonexudative age-related macular degeneration, left eye, intermediate dry stage: Secondary | ICD-10-CM | POA: Diagnosis not present

## 2018-08-09 DIAGNOSIS — K529 Noninfective gastroenteritis and colitis, unspecified: Secondary | ICD-10-CM | POA: Diagnosis not present

## 2018-08-17 DIAGNOSIS — R05 Cough: Secondary | ICD-10-CM | POA: Diagnosis not present

## 2018-09-22 DIAGNOSIS — H353211 Exudative age-related macular degeneration, right eye, with active choroidal neovascularization: Secondary | ICD-10-CM | POA: Diagnosis not present

## 2018-10-19 DIAGNOSIS — I10 Essential (primary) hypertension: Secondary | ICD-10-CM | POA: Diagnosis not present

## 2018-10-19 DIAGNOSIS — C189 Malignant neoplasm of colon, unspecified: Secondary | ICD-10-CM | POA: Diagnosis not present

## 2018-10-19 DIAGNOSIS — D509 Iron deficiency anemia, unspecified: Secondary | ICD-10-CM | POA: Diagnosis not present

## 2018-10-19 DIAGNOSIS — C50912 Malignant neoplasm of unspecified site of left female breast: Secondary | ICD-10-CM | POA: Diagnosis not present

## 2018-10-19 DIAGNOSIS — R7303 Prediabetes: Secondary | ICD-10-CM | POA: Diagnosis not present

## 2018-10-19 DIAGNOSIS — M810 Age-related osteoporosis without current pathological fracture: Secondary | ICD-10-CM | POA: Diagnosis not present

## 2018-10-19 DIAGNOSIS — Z0000 Encounter for general adult medical examination without abnormal findings: Secondary | ICD-10-CM | POA: Diagnosis not present

## 2018-10-19 DIAGNOSIS — F419 Anxiety disorder, unspecified: Secondary | ICD-10-CM | POA: Diagnosis not present

## 2018-10-19 DIAGNOSIS — Z1389 Encounter for screening for other disorder: Secondary | ICD-10-CM | POA: Diagnosis not present

## 2018-10-19 DIAGNOSIS — E039 Hypothyroidism, unspecified: Secondary | ICD-10-CM | POA: Diagnosis not present

## 2018-10-26 DIAGNOSIS — E039 Hypothyroidism, unspecified: Secondary | ICD-10-CM | POA: Diagnosis not present

## 2018-10-26 DIAGNOSIS — R7303 Prediabetes: Secondary | ICD-10-CM | POA: Diagnosis not present

## 2018-10-26 DIAGNOSIS — I10 Essential (primary) hypertension: Secondary | ICD-10-CM | POA: Diagnosis not present

## 2018-11-24 DIAGNOSIS — H353211 Exudative age-related macular degeneration, right eye, with active choroidal neovascularization: Secondary | ICD-10-CM | POA: Diagnosis not present

## 2018-11-24 DIAGNOSIS — H43813 Vitreous degeneration, bilateral: Secondary | ICD-10-CM | POA: Diagnosis not present

## 2018-11-24 DIAGNOSIS — H353122 Nonexudative age-related macular degeneration, left eye, intermediate dry stage: Secondary | ICD-10-CM | POA: Diagnosis not present

## 2018-12-03 ENCOUNTER — Other Ambulatory Visit: Payer: Self-pay | Admitting: Internal Medicine

## 2018-12-03 DIAGNOSIS — Z1231 Encounter for screening mammogram for malignant neoplasm of breast: Secondary | ICD-10-CM

## 2019-01-18 ENCOUNTER — Other Ambulatory Visit: Payer: Self-pay

## 2019-01-18 ENCOUNTER — Ambulatory Visit
Admission: RE | Admit: 2019-01-18 | Discharge: 2019-01-18 | Disposition: A | Discharge status: Home / Self Care | Payer: Medicare Other | Source: Ambulatory Visit | Attending: Internal Medicine | Admitting: Internal Medicine

## 2019-01-18 DIAGNOSIS — Z23 Encounter for immunization: Secondary | ICD-10-CM | POA: Diagnosis not present

## 2019-01-18 DIAGNOSIS — Z1231 Encounter for screening mammogram for malignant neoplasm of breast: Secondary | ICD-10-CM | POA: Diagnosis not present

## 2019-01-26 DIAGNOSIS — H353122 Nonexudative age-related macular degeneration, left eye, intermediate dry stage: Secondary | ICD-10-CM | POA: Diagnosis not present

## 2019-01-26 DIAGNOSIS — H43813 Vitreous degeneration, bilateral: Secondary | ICD-10-CM | POA: Diagnosis not present

## 2019-01-26 DIAGNOSIS — H353211 Exudative age-related macular degeneration, right eye, with active choroidal neovascularization: Secondary | ICD-10-CM | POA: Diagnosis not present

## 2019-03-01 DIAGNOSIS — M810 Age-related osteoporosis without current pathological fracture: Secondary | ICD-10-CM | POA: Diagnosis not present

## 2019-03-01 DIAGNOSIS — R7303 Prediabetes: Secondary | ICD-10-CM | POA: Diagnosis not present

## 2019-03-01 DIAGNOSIS — F419 Anxiety disorder, unspecified: Secondary | ICD-10-CM | POA: Diagnosis not present

## 2019-03-01 DIAGNOSIS — E039 Hypothyroidism, unspecified: Secondary | ICD-10-CM | POA: Diagnosis not present

## 2019-03-01 DIAGNOSIS — Z85038 Personal history of other malignant neoplasm of large intestine: Secondary | ICD-10-CM | POA: Diagnosis not present

## 2019-03-01 DIAGNOSIS — I10 Essential (primary) hypertension: Secondary | ICD-10-CM | POA: Diagnosis not present

## 2019-03-01 DIAGNOSIS — E782 Mixed hyperlipidemia: Secondary | ICD-10-CM | POA: Diagnosis not present

## 2019-03-01 DIAGNOSIS — N182 Chronic kidney disease, stage 2 (mild): Secondary | ICD-10-CM | POA: Diagnosis not present

## 2019-03-01 DIAGNOSIS — C50912 Malignant neoplasm of unspecified site of left female breast: Secondary | ICD-10-CM | POA: Diagnosis not present

## 2019-03-01 DIAGNOSIS — D509 Iron deficiency anemia, unspecified: Secondary | ICD-10-CM | POA: Diagnosis not present

## 2019-04-06 DIAGNOSIS — H353211 Exudative age-related macular degeneration, right eye, with active choroidal neovascularization: Secondary | ICD-10-CM | POA: Diagnosis not present

## 2019-04-06 DIAGNOSIS — H353122 Nonexudative age-related macular degeneration, left eye, intermediate dry stage: Secondary | ICD-10-CM | POA: Diagnosis not present

## 2019-04-06 DIAGNOSIS — H43813 Vitreous degeneration, bilateral: Secondary | ICD-10-CM | POA: Diagnosis not present

## 2019-04-10 DIAGNOSIS — N3001 Acute cystitis with hematuria: Secondary | ICD-10-CM | POA: Diagnosis not present

## 2019-04-10 DIAGNOSIS — R350 Frequency of micturition: Secondary | ICD-10-CM | POA: Diagnosis not present

## 2019-06-15 DIAGNOSIS — H43813 Vitreous degeneration, bilateral: Secondary | ICD-10-CM | POA: Diagnosis not present

## 2019-06-15 DIAGNOSIS — H353122 Nonexudative age-related macular degeneration, left eye, intermediate dry stage: Secondary | ICD-10-CM | POA: Diagnosis not present

## 2019-06-15 DIAGNOSIS — H353211 Exudative age-related macular degeneration, right eye, with active choroidal neovascularization: Secondary | ICD-10-CM | POA: Diagnosis not present

## 2019-08-24 DIAGNOSIS — H353211 Exudative age-related macular degeneration, right eye, with active choroidal neovascularization: Secondary | ICD-10-CM | POA: Diagnosis not present

## 2019-08-24 DIAGNOSIS — H353122 Nonexudative age-related macular degeneration, left eye, intermediate dry stage: Secondary | ICD-10-CM | POA: Diagnosis not present

## 2019-08-24 DIAGNOSIS — H43813 Vitreous degeneration, bilateral: Secondary | ICD-10-CM | POA: Diagnosis not present

## 2019-09-17 DIAGNOSIS — R000 Tachycardia, unspecified: Secondary | ICD-10-CM | POA: Insufficient documentation

## 2019-09-26 ENCOUNTER — Encounter: Payer: Self-pay | Admitting: Cardiology

## 2019-09-26 DIAGNOSIS — R109 Unspecified abdominal pain: Secondary | ICD-10-CM | POA: Diagnosis not present

## 2019-09-26 DIAGNOSIS — R002 Palpitations: Secondary | ICD-10-CM | POA: Diagnosis not present

## 2019-10-04 DIAGNOSIS — R7309 Other abnormal glucose: Secondary | ICD-10-CM | POA: Diagnosis not present

## 2019-10-04 DIAGNOSIS — D509 Iron deficiency anemia, unspecified: Secondary | ICD-10-CM | POA: Diagnosis not present

## 2019-10-04 DIAGNOSIS — F419 Anxiety disorder, unspecified: Secondary | ICD-10-CM | POA: Diagnosis not present

## 2019-10-04 DIAGNOSIS — E039 Hypothyroidism, unspecified: Secondary | ICD-10-CM | POA: Diagnosis not present

## 2019-10-04 DIAGNOSIS — N1831 Chronic kidney disease, stage 3a: Secondary | ICD-10-CM | POA: Diagnosis not present

## 2019-10-04 DIAGNOSIS — R002 Palpitations: Secondary | ICD-10-CM | POA: Diagnosis not present

## 2019-10-04 DIAGNOSIS — M810 Age-related osteoporosis without current pathological fracture: Secondary | ICD-10-CM | POA: Diagnosis not present

## 2019-10-04 DIAGNOSIS — I10 Essential (primary) hypertension: Secondary | ICD-10-CM | POA: Diagnosis not present

## 2019-10-04 DIAGNOSIS — E782 Mixed hyperlipidemia: Secondary | ICD-10-CM | POA: Diagnosis not present

## 2019-10-04 DIAGNOSIS — Z85038 Personal history of other malignant neoplasm of large intestine: Secondary | ICD-10-CM | POA: Diagnosis not present

## 2019-10-04 DIAGNOSIS — Z853 Personal history of malignant neoplasm of breast: Secondary | ICD-10-CM | POA: Diagnosis not present

## 2019-10-14 ENCOUNTER — Other Ambulatory Visit: Payer: Self-pay

## 2019-10-14 ENCOUNTER — Ambulatory Visit (INDEPENDENT_AMBULATORY_CARE_PROVIDER_SITE_OTHER): Payer: Medicare Other | Admitting: Cardiology

## 2019-10-14 ENCOUNTER — Telehealth: Payer: Self-pay | Admitting: Radiology

## 2019-10-14 VITALS — BP 130/72 | HR 88 | Ht 63.0 in | Wt 182.0 lb

## 2019-10-14 DIAGNOSIS — R002 Palpitations: Secondary | ICD-10-CM

## 2019-10-14 DIAGNOSIS — R011 Cardiac murmur, unspecified: Secondary | ICD-10-CM | POA: Diagnosis not present

## 2019-10-14 DIAGNOSIS — I10 Essential (primary) hypertension: Secondary | ICD-10-CM | POA: Diagnosis not present

## 2019-10-14 NOTE — Progress Notes (Signed)
Cardiology Office Note:    Date:  10/14/2019   ID:  Carla Petersen, DOB Jan 13, 1941, MRN [id number]  PCP:  Carla Low, MD  Cardiologist:  No primary care provider on file.  Electrophysiologist:  None   Referring MD: Carla Low, MD   Chief Complaint  Patient presents with  . Palpitations    History of Present Illness:    Carla Petersen is a 79 y.o. female with a hx of Graves' disease, hyperlipidemia, prediabetes, breast cancer who is referred by Dr. Lysle Petersen for evaluation of palpitations.  She reports that she began having palpitations around 09/26/2019.  Feels like her heart is racing during episodes.  Since episodes started she will have some days where she does not have any episodes but some days where they occur multiple times per day.  She did check her pulse during one episode and was 138.  Episode last anywhere from 10 seconds to 1 minute.  She has felt lightheaded during episodes but denies any syncope.  Also feels abdominal pain and feels like something is stuck in her throat at times.  She denies any shortness of breath or chest pain.  No regular exercise.  She quit smoking in 1995.  She does not drink coffee but drinks one 8 ounce Coke per day.  She does not drink alcohol.   Past Medical History:  Diagnosis Date  . Graves disease   . Personal history of radiation therapy    Left Breast Cancer    Past Surgical History:  Procedure Laterality Date  . BREAST EXCISIONAL BIOPSY Left   . BREAST EXCISIONAL BIOPSY Left   . BREAST LUMPECTOMY Left 1995    Current Medications: Current Meds  Medication Sig  . aspirin 81 MG tablet Take 81 mg by mouth daily.  . calcium & magnesium carbonates (MYLANTA) 311-232 MG per tablet Take 1 tablet by mouth daily.  . ferrous sulfate 220 (44 FE) MG/5ML solution Take 65 mg by mouth daily.  . fish oil-omega-3 fatty acids 1000 MG capsule Take 300 mg by mouth daily.  Marland Kitchen levothyroxine (SYNTHROID, LEVOTHROID) 25 MCG tablet Take 25 mcg by mouth  daily.  . Multiple Vitamins-Minerals (CENTRUM SILVER ULTRA WOMENS) TABS Take 1 each by mouth daily.  . Nutritional Supplements (VITAMIN D MAINTENANCE PO) Take 800 Units by mouth daily.  . potassium chloride (MICRO-K) 10 MEQ CR capsule Take 10 mEq by mouth daily.  . raloxifene (EVISTA) 60 MG tablet Take 60 mg by mouth daily.  Marland Kitchen tobramycin (TOBREX) 0.3 % ophthalmic solution every 4 (four) hours.  . triamterene (DYRENIUM) 100 MG capsule Take 25 mg by mouth daily.     Allergies:   Latex, Penicillins, and Sulfa antibiotics   Social History   Socioeconomic History  . Marital status: Married    Spouse name: Not on file  . Number of children: Not on file  . Years of education: Not on file  . Highest education level: Not on file  Occupational History  . Not on file  Tobacco Use  . Smoking status: Former Research scientist (life sciences)  . Smokeless tobacco: Former Systems developer    Quit date: 07/06/1993  Substance and Sexual Activity  . Alcohol use: No  . Drug use: No  . Sexual activity: Not on file  Other Topics Concern  . Not on file  Social History Narrative  . Not on file   Social Determinants of Health   Financial Resource Strain:   . Difficulty of Paying Living Expenses:   Food Insecurity:   .  Worried About Charity fundraiser in the Last Year:   . Arboriculturist in the Last Year:   Transportation Needs:   . Film/video editor (Medical):   Marland Kitchen Lack of Transportation (Non-Medical):   Physical Activity:   . Days of Exercise per Week:   . Minutes of Exercise per Session:   Stress:   . Feeling of Stress :   Social Connections:   . Frequency of Communication with Friends and Family:   . Frequency of Social Gatherings with Friends and Family:   . Attends Religious Services:   . Active Member of Clubs or Organizations:   . Attends Archivist Meetings:   Marland Kitchen Marital Status:      Family History: The patient's family history is not on file.  ROS:   Please see the history of present illness.       All other systems reviewed and are negative.  EKGs/Labs/Other Studies Reviewed:    The following studies were reviewed today:   EKG:  EKG is ordered today.  The ekg ordered today demonstrates normal sinus rhythm, rate 88, no ST abnormalities  Recent Labs: No results found for requested labs within last 8760 hours.  Recent Lipid Panel No results found for: CHOL, TRIG, HDL, CHOLHDL, VLDL, LDLCALC, LDLDIRECT  Physical Exam:    VS:  BP 130/72   Pulse 88   Ht 5\' 3"  (1.6 m)   Wt 182 lb (82.6 kg)   LMP 07/07/1983   SpO2 95%   BMI 32.24 kg/m     Wt Readings from Last 3 Encounters:  10/14/19 182 lb (82.6 kg)  07/07/11 169 lb (76.7 kg)     GEN: in no acute distress HEENT: Normal NECK: No JVD; No carotid bruits CARDIAC: RRR, 2 out of 6 systolic heart murmur RESPIRATORY:  Clear to auscultation without rales, wheezing or rhonchi  ABDOMEN: Soft, non-tender, non-distended MUSCULOSKELETAL:  No edema; No deformity  SKIN: Warm and dry NEUROLOGIC:  Alert and oriented x 3 PSYCHIATRIC:  Normal affect   ASSESSMENT:    1. Palpitations   2. Murmur   3. Essential hypertension    PLAN:     Palpitations: Description concerning for arrhythmia, will evaluate with Zio patch x7 days.  Will check TTE to evaluate for structural heart disease.  Heart murmur: Systolic murmur on exam, will check TTE as above  Hypertension: On triamterene-hydrochlorothiazide 37.5-25 mg.  Appears controlled  RTC in 2 months  Medication Adjustments/Labs and Tests Ordered: Current medicines are reviewed at length with the patient today.  Concerns regarding medicines are outlined above.  Orders Placed This Encounter  Procedures  . LONG TERM MONITOR (3-14 DAYS)  . EKG 12-Lead  . ECHOCARDIOGRAM COMPLETE   No orders of the defined types were placed in this encounter.   Patient Instructions  Medication Instructions:  Your physician recommends that you continue on your current medications as directed.  Please refer to the Current Medication list given to you today.  Testing/Procedures: Your physician has requested that you have an echocardiogram. Echocardiography is a painless test that uses sound waves to create images of your heart. It provides your doctor with information about the size and shape of your heart and how well your heart's chambers and valves are working. This procedure takes approximately one hour. There are no restrictions for this procedure. This will be done at our [facility] location:  [facility]  Your physician has requested you wear your ZIO patch monitor 7 days.   This is a single patch monitor.  [employer] supplies one patch monitor per enrollment.  Additional stickers are not available.   Please do not apply patch if you will be having a Nuclear Stress Test, Echocardiogram, Cardiac CT, MRI, or Chest Xray during the time frame you would be wearing the monitor. The patch cannot be worn during these tests.  You cannot remove and re-apply the ZIO XT patch monitor.   Your ZIO patch monitor will be sent USPS Priority mail from [facility] directly to your home address. The monitor may also be mailed to a PO BOX if home delivery is not available.   It may take 3-5 days to receive your monitor after you have been enrolled.   Once you have received you monitor, please review enclosed instructions.  Your monitor has already been registered assigning a specific monitor serial # to you.   Applying the monitor   Shave hair from upper left chest.   Hold abrader disc by orange tab.  Rub abrader in 40 strokes over left upper chest as indicated in your monitor instructions.   Clean area with 4 enclosed alcohol pads .  Use all pads to assure are is cleaned thoroughly.  Let dry.   Apply patch as indicated in monitor instructions.  Patch will be place under collarbone on left side of chest with arrow pointing  upward.   Rub patch adhesive wings for 2 minutes.Remove white label marked "1".  Remove white label marked "2".  Rub patch adhesive wings for 2 additional minutes.   While looking in a mirror, press and release button in center of patch.  A small green light will flash 3-4 times .  This will be your only indicator the monitor has been turned on.     Do not shower for the first 24 hours.  You may shower after the first 24 hours.   Press button if you feel a symptom. You will hear a small click.  Record Date, Time and Symptom in the Patient Log Book.   When you are ready to remove patch, follow instructions on last 2 pages of Patient Log Book.  Stick patch monitor onto last page of Patient Log Book.   Place Patient Log Book in [place] box.  Use locking tab on box and tape box closed securely.  The [employer] and [employer] has [employer] on it.  Please place in mailbox as soon as possible.  Your physician should have your test results approximately 7 days after the monitor has been mailed back to [facility].   Call [place] at [phone] if you have questions regarding your ZIO XT patch monitor.  Call them immediately if you see an orange light blinking on your monitor.   If your monitor falls off in less than 4 days contact our Monitor department at [phone].  If your monitor becomes loose or falls off after 4 days call [employer] at [phone] for suggestions on securing your monitor.      Follow-Up: At [facility], you and your health needs are our priority.  As part of our continuing mission to provide you with exceptional heart care, we have created designated Provider Care Teams.  These Care Teams include your primary Cardiologist (physician) and Advanced Practice Providers (APPs -  Physician Assistants and Nurse Practitioners) who all work together to provide you with the care you need, when you  need it.  We recommend signing up for the patient portal  called "MyChart".  Sign up information is provided on this After Visit Summary.  MyChart is used to connect with patients for Virtual Visits (Telemedicine).  Patients are able to view lab/test results, encounter notes, upcoming appointments, etc.  Non-urgent messages can be sent to your provider as well.   To learn more about what you can do with MyChart, go to [email or web address].    Your next appointment:   2 month(s)  The format for your next appointment:   In Person  Provider:   Oswaldo Milian, MD         Signed, Donato Heinz, MD  10/14/2019 9:59 PM    [place]

## 2019-10-14 NOTE — Telephone Encounter (Signed)
Enrolled patient for a 7 day Zio monitor to be mailed to patients home.  

## 2019-10-14 NOTE — Patient Instructions (Signed)
Medication Instructions:  °Your physician recommends that you continue on your current medications as directed. Please refer to the Current Medication list given to you today. ° °Testing/Procedures: °Your physician has requested that you have an echocardiogram. Echocardiography is a painless test that uses sound waves to create images of your heart. It provides your doctor with information about the size and shape of your heart and how well your heart’s chambers and valves are working. This procedure takes approximately one hour. There are no restrictions for this procedure. °This will be done at our [address] location:  [address] ° °ZIO XT- Long Term Monitor Instructions  ° °Your physician has requested you wear your ZIO patch monitor 7 days.  ° °This is a single patch monitor.  [employer] supplies one patch monitor per enrollment.  Additional stickers are not available. °  °Please do not apply patch if you will be having a Nuclear Stress Test, Echocardiogram, Cardiac CT, MRI, or Chest Xray during the time frame you would be wearing the monitor. The patch cannot be worn during these tests.  You cannot remove and re-apply the ZIO XT patch monitor. °  °Your ZIO patch monitor will be sent USPS Priority mail from [employer] directly to your home address. The monitor may also be mailed to a PO BOX if home delivery is not available.   It may take 3-5 days to receive your monitor after you have been enrolled. °  °Once you have received you monitor, please review enclosed instructions.  Your monitor has already been registered assigning a specific monitor serial # to you. °  °Applying the monitor  ° °Shave hair from upper left chest. °  °Hold abrader disc by orange tab.  Rub abrader in 40 strokes over left upper chest as indicated in your monitor instructions. °  °Clean area with 4 enclosed alcohol pads .  Use all pads to assure are is cleaned thoroughly.  Let dry.  ° °Apply patch as  indicated in monitor instructions.  Patch will be place under collarbone on left side of chest with arrow pointing upward. °  °Rub patch adhesive wings for 2 minutes.Remove white label marked "1".  Remove white label marked "2".  Rub patch adhesive wings for 2 additional minutes. °  °While looking in a mirror, press and release button in center of patch.  A small green light will flash 3-4 times .  This will be your only indicator the monitor has been turned on. °    °Do not shower for the first 24 hours.  You may shower after the first 24 hours. °  °Press button if you feel a symptom. You will hear a small click.  Record Date, Time and Symptom in the Patient Log Book. °  °When you are ready to remove patch, follow instructions on last 2 pages of Patient Log Book.  Stick patch monitor onto last page of Patient Log Book. °  °Place Patient Log Book in Blue box.  Use locking tab on box and tape box closed securely.  The Orange and White box has prepaid postage on it.  Please place in mailbox as soon as possible.  Your physician should have your test results approximately 7 days after the monitor has been mailed back to [employer]. °  °Call [employer] Customer Care at [phone] if you have questions regarding your ZIO XT patch monitor.  Call them immediately if you see an orange light blinking on your   your monitor.   If your monitor falls off in less than 4 days contact our Monitor department at [phone].  If your monitor becomes loose or falls off after 4 days call [employer] at [phone] for suggestions on securing your monitor.      Follow-Up: At [facility], you and your health needs are our priority.  As part of our continuing mission to provide you with exceptional heart care, we have created designated Provider Care Teams.  These Care Teams include your primary Cardiologist (physician) and Advanced Practice Providers (APPs -  Physician Assistants and Nurse Practitioners) who all work  together to provide you with the care you need, when you need it.  We recommend signing up for the patient portal called "MyChart".  Sign up information is provided on this After Visit Summary.  MyChart is used to connect with patients for Virtual Visits (Telemedicine).  Patients are able to view lab/test results, encounter notes, upcoming appointments, etc.  Non-urgent messages can be sent to your provider as well.   To learn more about what you can do with MyChart, go to [email or web address].    Your next appointment:   2 month(s)  The format for your next appointment:   In Person  Provider:   Oswaldo Milian, MD

## 2019-10-19 ENCOUNTER — Other Ambulatory Visit (INDEPENDENT_AMBULATORY_CARE_PROVIDER_SITE_OTHER): Payer: Medicare Other

## 2019-10-19 DIAGNOSIS — R011 Cardiac murmur, unspecified: Secondary | ICD-10-CM

## 2019-10-19 DIAGNOSIS — R002 Palpitations: Secondary | ICD-10-CM | POA: Diagnosis not present

## 2019-10-28 DIAGNOSIS — H353122 Nonexudative age-related macular degeneration, left eye, intermediate dry stage: Secondary | ICD-10-CM | POA: Diagnosis not present

## 2019-10-28 DIAGNOSIS — H353211 Exudative age-related macular degeneration, right eye, with active choroidal neovascularization: Secondary | ICD-10-CM | POA: Diagnosis not present

## 2019-10-28 DIAGNOSIS — H43813 Vitreous degeneration, bilateral: Secondary | ICD-10-CM | POA: Diagnosis not present

## 2019-11-01 DIAGNOSIS — K4090 Unilateral inguinal hernia, without obstruction or gangrene, not specified as recurrent: Secondary | ICD-10-CM | POA: Insufficient documentation

## 2019-11-07 ENCOUNTER — Ambulatory Visit (HOSPITAL_COMMUNITY): Payer: Medicare Other | Attending: Cardiology

## 2019-11-07 ENCOUNTER — Other Ambulatory Visit: Payer: Self-pay

## 2019-11-07 DIAGNOSIS — R011 Cardiac murmur, unspecified: Secondary | ICD-10-CM | POA: Diagnosis not present

## 2019-11-07 DIAGNOSIS — R002 Palpitations: Secondary | ICD-10-CM | POA: Insufficient documentation

## 2019-11-10 ENCOUNTER — Telehealth: Payer: Self-pay | Admitting: Cardiology

## 2019-11-10 DIAGNOSIS — I4891 Unspecified atrial fibrillation: Secondary | ICD-10-CM

## 2019-11-10 NOTE — Telephone Encounter (Signed)
Spoke with Margreta Journey from [facility] and pt had a episode of afib aflutter at a rate of 189 for 60 secs on 10/24/19 at 7:59 pm Spoke with pt and does not recall any symptoms during that time Will forward to Dr Alda Lea for review of whole monitor once scanned ./cy

## 2019-11-10 NOTE — Telephone Encounter (Signed)
New Message  Carla Petersen calling in with abnormal zio results. Transfer to triage.

## 2019-11-12 NOTE — Telephone Encounter (Signed)
New diagnosis atrial fibrillation.  Recommend starting Eliquis 5 mg BID and metoprolol 25 mg BID, and checking BMP, CBC.  Can we schedule appointment in next 1-2 weeks?

## 2019-11-14 MED ORDER — METOPROLOL TARTRATE 25 MG PO TABS: 25.0000 mg | ORAL_TABLET | Freq: Two times a day (BID) | ORAL | 3 refills | Status: DC

## 2019-11-14 MED ORDER — APIXABAN 5 MG PO TABS: 5.0000 mg | ORAL_TABLET | Freq: Two times a day (BID) | ORAL | 3 refills | Status: DC

## 2019-11-14 NOTE — Telephone Encounter (Signed)
Patient made aware of recommendations and verbalized understanding.  rx sent to pharmacy.   OV scheduled for 7/12 with Dr. Gardiner Rhyme.

## 2019-11-15 ENCOUNTER — Other Ambulatory Visit: Payer: Self-pay

## 2019-11-15 DIAGNOSIS — I4891 Unspecified atrial fibrillation: Secondary | ICD-10-CM

## 2019-11-16 ENCOUNTER — Telehealth: Payer: Self-pay | Admitting: Cardiology

## 2019-11-16 LAB — BASIC METABOLIC PANEL
BUN/Creatinine Ratio: 18 (ref 12–28)
BUN: 20 mg/dL (ref 8–27)
CO2: 27 mmol/L (ref 20–29)
Calcium: 10.4 mg/dL — ABNORMAL HIGH (ref 8.7–10.3)
Chloride: 97 mmol/L (ref 96–106)
Creatinine, Ser: 1.14 mg/dL — ABNORMAL HIGH (ref 0.57–1.00)
GFR calc Af Amer: 52 mL/min/{1.73_m2} — ABNORMAL LOW (ref >59)
GFR calc non Af Amer: 45 mL/min/{1.73_m2} — ABNORMAL LOW (ref >59)
Glucose: 144 mg/dL — ABNORMAL HIGH (ref 65–99)
Potassium: 4.1 mmol/L (ref 3.5–5.2)
Sodium: 142 mmol/L (ref 134–144)

## 2019-11-16 LAB — CBC
Hematocrit: 50.8 % — ABNORMAL HIGH (ref 34.0–46.6)
Hemoglobin: 17.2 g/dL — ABNORMAL HIGH (ref 11.1–15.9)
MCH: 32.5 pg (ref 26.6–33.0)
MCHC: 33.9 g/dL (ref 31.5–35.7)
MCV: 96 fL (ref 79–97)
Platelets: 266 10*3/uL (ref 150–450)
RBC: 5.3 x10E6/uL — ABNORMAL HIGH (ref 3.77–5.28)
RDW: 13.1 % (ref 11.7–15.4)
WBC: 9 10*3/uL (ref 3.4–10.8)

## 2019-11-16 NOTE — Telephone Encounter (Signed)
    Pt c/o medication issue:  1. Name of Medication:   apixaban (ELIQUIS) 5 MG TABS tablet    2. How are you currently taking this medication (dosage and times per day)? Take 1 tablet (5 mg total) by mouth 2 (two) times daily.  3. Are you having a reaction (difficulty breathing--STAT)?   4. What is your medication issue? Pt said she picked up this medication and on the label it says "before taking this medication contact your doctor if you have kidney issue" she said she have kidney issue and would like to check if this is safe for her to take

## 2019-11-16 NOTE — Telephone Encounter (Signed)
Pt is calling to follow-up with Dr. Gardiner Rhyme to let him know that she does have a diagnosis of Chronic Kidney Disease, per what she reports is seen in her pt portal with [facility].  Pt states she was started on Eliquis the other day, for noted afib, and Dr. Gardiner Rhyme ordered lab results on her to further assess her renal function.  Pt is concerned about proceeding with this medication and having chronic kidney disease, for she researched and read this online.  Pt states she has not heard back from her lab results from Dr. Gardiner Rhyme yet.  Appears the results are back, just haven't had final review by Dr. Gardiner Rhyme yet.  Informed the pt that I will route this message to our Pharmacist and Dr. Jobe Igo and nurse, to further review, advise on lab results and creatinine clearance to proceed with taking Eliquis, and follow-up with the pt thereafter.  Pt verbalized understanding and agrees with this plan.

## 2019-11-16 NOTE — Telephone Encounter (Signed)
79yo Female Scr = 1.14 Wt 82.6kg Last ov with schumann on 10/14/2019   Okay to continue Eliquis 5mg  twice daily

## 2019-11-16 NOTE — Telephone Encounter (Signed)
Patient spoke to pharmD, no other questions at this time.

## 2019-11-28 ENCOUNTER — Other Ambulatory Visit: Payer: Self-pay

## 2019-11-28 ENCOUNTER — Ambulatory Visit (INDEPENDENT_AMBULATORY_CARE_PROVIDER_SITE_OTHER): Payer: Medicare Other | Admitting: Cardiology

## 2019-11-28 ENCOUNTER — Encounter: Payer: Self-pay | Admitting: Cardiology

## 2019-11-28 VITALS — BP 100/70 | HR 67 | Ht 63.0 in | Wt 182.0 lb

## 2019-11-28 DIAGNOSIS — I4891 Unspecified atrial fibrillation: Secondary | ICD-10-CM | POA: Diagnosis not present

## 2019-11-28 DIAGNOSIS — I10 Essential (primary) hypertension: Secondary | ICD-10-CM

## 2019-11-28 NOTE — Progress Notes (Signed)
Cardiology Office Note:    Date:  11/28/2019   ID:  Carla Petersen, DOB 17-Nov-1940, MRN [id number]  PCP:  Wenda Low, MD  Cardiologist:  No primary care provider on file.  Electrophysiologist:  None   Referring MD: Wenda Low, MD   Chief Complaint  Patient presents with  . Follow-up    2 months    History of Present Illness:    Carla Petersen is a 79 y.o. female with a hx of Graves' disease, hyperlipidemia, prediabetes, breast cancer who presents for follow-up.  She was referred by Dr. Lysle Rubens for evaluation of palpitations, initially seen on 10/14/2019.  She reports that she began having palpitations around 09/26/2019.  Feels like her heart is racing during episodes.  Since episodes started she will have some days where she does not have any episodes but some days where they occur multiple times per day.  She did check her pulse during one episode and was 138.  Episode last anywhere from 10 seconds to 1 minute.  She has felt lightheaded during episodes but denies any syncope.  Also feels abdominal pain and feels like something is stuck in her throat at times.  She denies any shortness of breath or chest pain.  No regular exercise.  She quit smoking in 1995.  She does not drink coffee but drinks one 8 ounce Coke per day.  She does not drink alcohol.  Echocardiogram on 11/07/2019 showed normal biventricular function, no significant valvular disease.  Zio patch x7 days on 11/10/2019 showed 1% atrial fibrillation/atrial flutter burden, with average heart rate 152 bpm and longest episode lasting for 4 minutes.  She was started on Eliquis 5 mg twice daily and metoprolol 25 mg twice daily.  Since last clinic visit, she reports that she has been doing well.  She has been taking Eliquis, denies any bleeding.  Does report she has been having some lightheadedness, particularly with standing.  Has not been checking her BP.  Denies any syncope but felt like she might pass out.  She has not been  exercising.  She denies any chest pain or shortness of breath.    Past Medical History:  Diagnosis Date  . Graves disease   . Personal history of radiation therapy    Left Breast Cancer    Past Surgical History:  Procedure Laterality Date  . BREAST EXCISIONAL BIOPSY Left   . BREAST EXCISIONAL BIOPSY Left   . BREAST LUMPECTOMY Left 1995    Current Medications: Current Meds  Medication Sig  . apixaban (ELIQUIS) 5 MG TABS tablet Take 1 tablet (5 mg total) by mouth 2 (two) times daily.  Marland Kitchen aspirin 81 MG tablet Take 81 mg by mouth daily.  . ferrous sulfate 220 (44 FE) MG/5ML solution Take 65 mg by mouth daily.  . fish oil-omega-3 fatty acids 1000 MG capsule Take 300 mg by mouth daily.  Marland Kitchen levothyroxine (SYNTHROID, LEVOTHROID) 25 MCG tablet Take 25 mcg by mouth daily.  . metoprolol tartrate (LOPRESSOR) 25 MG tablet Take 1 tablet (25 mg total) by mouth 2 (two) times daily.  . Nutritional Supplements (VITAMIN D MAINTENANCE PO) Take 800 Units by mouth daily.  Marland Kitchen tobramycin (TOBREX) 0.3 % ophthalmic solution every 4 (four) hours.  . [DISCONTINUED] calcium & magnesium carbonates (MYLANTA) 311-232 MG per tablet Take 1 tablet by mouth daily.  . [DISCONTINUED] triamterene (DYRENIUM) 100 MG capsule Take 25 mg by mouth daily.     Allergies:   Latex, Penicillins, and Sulfa antibiotics   Social  History   Socioeconomic History  . Marital status: Married    Spouse name: Not on file  . Number of children: Not on file  . Years of education: Not on file  . Highest education level: Not on file  Occupational History  . Not on file  Tobacco Use  . Smoking status: Former Research scientist (life sciences)  . Smokeless tobacco: Former Systems developer    Quit date: 07/06/1993  Substance and Sexual Activity  . Alcohol use: No  . Drug use: No  . Sexual activity: Not on file  Other Topics Concern  . Not on file  Social History Narrative  . Not on file   Social Determinants of Health   Financial Resource Strain:   . Difficulty of  Paying Living Expenses:   Food Insecurity:   . Worried About Charity fundraiser in the Last Year:   . Arboriculturist in the Last Year:   Transportation Needs:   . Film/video editor (Medical):   Marland Kitchen Lack of Transportation (Non-Medical):   Physical Activity:   . Days of Exercise per Week:   . Minutes of Exercise per Session:   Stress:   . Feeling of Stress :   Social Connections:   . Frequency of Communication with Friends and Family:   . Frequency of Social Gatherings with Friends and Family:   . Attends Religious Services:   . Active Member of Clubs or Organizations:   . Attends Archivist Meetings:   Marland Kitchen Marital Status:      Family History: The patient's family history is not on file.  ROS:   Please see the history of present illness.     All other systems reviewed and are negative.  EKGs/Labs/Other Studies Reviewed:    The following studies were reviewed today:   EKG:  EKG is ordered today.  The ekg ordered today demonstrates normal sinus rhythm, rate 67, no ST abnormalities  Recent Labs: 11/15/2019: BUN 20; Creatinine, Ser 1.14; Hemoglobin 17.2; Platelets 266; Potassium 4.1; Sodium 142  Recent Lipid Panel No results found for: CHOL, TRIG, HDL, CHOLHDL, VLDL, LDLCALC, LDLDIRECT  Physical Exam:    VS:  BP 100/70 (BP Location: Right Arm, Patient Position: Sitting, Cuff Size: Normal)   Pulse 67   Ht 5\' 3"  (1.6 m)   Wt 182 lb (82.6 kg)   LMP 07/07/1983   SpO2 92%   BMI 32.24 kg/m     Wt Readings from Last 3 Encounters:  11/28/19 182 lb (82.6 kg)  10/14/19 182 lb (82.6 kg)  07/07/11 169 lb (76.7 kg)     GEN: in no acute distress HEENT: Normal NECK: No JVD; No carotid bruits CARDIAC: RRR, 2 out of 6 systolic heart murmur RESPIRATORY:  Clear to auscultation without rales, wheezing or rhonchi  ABDOMEN: Soft, non-tender, non-distended MUSCULOSKELETAL:  No edema; No deformity  SKIN: Warm and dry NEUROLOGIC:  Alert and oriented x 3 PSYCHIATRIC:   Normal affect   ASSESSMENT:    1. Atrial fibrillation, new onset ([place])   2. Essential hypertension    PLAN:    Paroxysmal atrial fibrillation/atrial flutter: Zio patch x7 days on 11/10/2019 showed 1% atrial fibrillation/atrial flutter burden, with average heart rate 152 bpm and longest episode lasting for minutes.  She was started on Eliquis 5 mg twice daily and metoprolol 25 mg twice daily.  Echocardiogram on 11/07/2019 showed normal biventricular function, no significant valvular disease.  CHA2DS2-VASc score 3 (hypertension, age x2). -Continue Eliquis 5 mg twice daily -  Continue metoprolol 25 mg twice daily -Order sleep study  Hypertension: On triamterene-hydrochlorothiazide but BP down to 92/68 when measured in clinic today.  Negative orthostatics.  Will hold  triamterene-HCTZ and continue metoprolol as above.  Asked patient to check BP daily for next week and call with results  RTC in 6 months  Medication Adjustments/Labs and Tests Ordered: Current medicines are reviewed at length with the patient today.  Concerns regarding medicines are outlined above.  Orders Placed This Encounter  Procedures  . EKG 12-Lead  . Split night study   No orders of the defined types were placed in this encounter.   Patient Instructions  Medication Instructions:  ---Call when you get home or send a mychart message to let us know what blood pressure medication you are taking ---HOLD THIS MEDICATION and monitor blood pressure daily at home, call in 1 week with the blood pressure readings.  *If you need a refill on your cardiac medications before your next appointment, please call your pharmacy*  Testing/Procedures: Your physician has recommended that you have a sleep study. This test records several body functions during sleep, including: brain activity, eye movement, oxygen and carbon dioxide blood levels, heart rate and rhythm, breathing rate and rhythm, the flow of air through your mouth and nose,  snoring, body muscle movements, and chest and belly movement. --this must be approved by insurance prior to scheduling.  Once approved, we will call you to schedule.  Follow-Up: At [facility], you and your health needs are our priority.  As part of our continuing mission to provide you with exceptional heart care, we have created designated Provider Care Teams.  These Care Teams include your primary Cardiologist (physician) and Advanced Practice Providers (APPs -  Physician Assistants and Nurse Practitioners) who all work together to provide you with the care you need, when you need it.  We recommend signing up for the patient portal called "MyChart".  Sign up information is provided on this After Visit Summary.  MyChart is used to connect with patients for Virtual Visits (Telemedicine).  Patients are able to view lab/test results, encounter notes, upcoming appointments, etc.  Non-urgent messages can be sent to your provider as well.   To learn more about what you can do with MyChart, go to [email or web address].    Your next appointment:   6 month(s)  The format for your next appointment:   In Person  Provider:   Oswaldo Milian, MD         Signed, Donato Heinz, MD  11/28/2019 6:16 PM    [facility]

## 2019-11-28 NOTE — Patient Instructions (Addendum)
Medication Instructions:  ---Call when you get home or send a mychart message to let us know what blood pressure medication you are taking ---HOLD THIS MEDICATION and monitor blood pressure daily at home, call in 1 week with the blood pressure readings.  *If you need a refill on your cardiac medications before your next appointment, please call your pharmacy*  Testing/Procedures: Your physician has recommended that you have a sleep study. This test records several body functions during sleep, including: brain activity, eye movement, oxygen and carbon dioxide blood levels, heart rate and rhythm, breathing rate and rhythm, the flow of air through your mouth and nose, snoring, body muscle movements, and chest and belly movement. --this must be approved by insurance prior to scheduling.  Once approved, we will call you to schedule.  Follow-Up: At [facility], you and your health needs are our priority.  As part of our continuing mission to provide you with exceptional heart care, we have created designated Provider Care Teams.  These Care Teams include your primary Cardiologist (physician) and Advanced Practice Providers (APPs -  Physician Assistants and Nurse Practitioners) who all work together to provide you with the care you need, when you need it.  We recommend signing up for the patient portal called "MyChart".  Sign up information is provided on this After Visit Summary.  MyChart is used to connect with patients for Virtual Visits (Telemedicine).  Patients are able to view lab/test results, encounter notes, upcoming appointments, etc.  Non-urgent messages can be sent to your provider as well.   To learn more about what you can do with MyChart, go to [email or web address].    Your next appointment:   6 month(s)  The format for your next appointment:   In Person  Provider:   Oswaldo Milian, MD

## 2019-11-30 ENCOUNTER — Telehealth: Payer: Self-pay | Admitting: *Deleted

## 2019-11-30 NOTE — Telephone Encounter (Signed)
Patient notified of sleep study appointment. 

## 2019-12-07 NOTE — Telephone Encounter (Signed)
Recommend increasing metoprolol to 50 mg BID

## 2019-12-08 MED ORDER — METOPROLOL TARTRATE 50 MG PO TABS: 50.0000 mg | ORAL_TABLET | Freq: Two times a day (BID) | ORAL | 3 refills | Status: DC

## 2019-12-18 DIAGNOSIS — G4730 Sleep apnea, unspecified: Secondary | ICD-10-CM | POA: Insufficient documentation

## 2019-12-23 ENCOUNTER — Other Ambulatory Visit: Payer: Self-pay

## 2019-12-23 ENCOUNTER — Ambulatory Visit (HOSPITAL_BASED_OUTPATIENT_CLINIC_OR_DEPARTMENT_OTHER): Payer: Medicare Other | Attending: Cardiology | Admitting: Cardiovascular Disease

## 2019-12-23 DIAGNOSIS — Z7901 Long term (current) use of anticoagulants: Secondary | ICD-10-CM | POA: Insufficient documentation

## 2019-12-23 DIAGNOSIS — Z7982 Long term (current) use of aspirin: Secondary | ICD-10-CM | POA: Diagnosis not present

## 2019-12-23 DIAGNOSIS — G4733 Obstructive sleep apnea (adult) (pediatric): Secondary | ICD-10-CM | POA: Diagnosis not present

## 2019-12-23 DIAGNOSIS — I4891 Unspecified atrial fibrillation: Secondary | ICD-10-CM | POA: Insufficient documentation

## 2019-12-23 DIAGNOSIS — G4730 Sleep apnea, unspecified: Secondary | ICD-10-CM | POA: Diagnosis not present

## 2019-12-23 DIAGNOSIS — Z79899 Other long term (current) drug therapy: Secondary | ICD-10-CM | POA: Diagnosis not present

## 2019-12-23 DIAGNOSIS — R0902 Hypoxemia: Secondary | ICD-10-CM | POA: Insufficient documentation

## 2019-12-23 DIAGNOSIS — I10 Essential (primary) hypertension: Secondary | ICD-10-CM | POA: Insufficient documentation

## 2019-12-30 ENCOUNTER — Encounter (HOSPITAL_BASED_OUTPATIENT_CLINIC_OR_DEPARTMENT_OTHER): Payer: Self-pay | Admitting: Cardiovascular Disease

## 2019-12-30 NOTE — Procedures (Signed)
Patient Name: Carla Petersen, Carla Petersen Date: 12/23/2019 Gender: Female D.O.B: 23-Jun-1940 Age (years): 55 Referring Provider: Oswaldo Milian Height (inches): 63 Interpreting Physician: Shelva Majestic MD, ABSM Weight (lbs): 181 RPSGT: Earney [place] BMI: 32 MRN: [id number] Neck Size: 16.00  CLINICAL INFORMATION Sleep Study Type: NPSG  Indication for sleep study: OSA  Epworth Sleepiness Score: 5  SLEEP STUDY TECHNIQUE As per the AASM Manual for the Scoring of Sleep and Associated Events v2.3 (April 2016) with a hypopnea requiring 4% desaturations.  The channels recorded and monitored were frontal, central and occipital EEG, electrooculogram (EOG), submentalis EMG (chin), nasal and oral airflow, thoracic and abdominal wall motion, anterior tibialis EMG, snore microphone, electrocardiogram, and pulse oximetry.  MEDICATIONS apixaban (ELIQUIS) 5 MG TABS tablet aspirin 81 MG tablet ferrous sulfate 220 (44 FE) MG/5ML solution fish oil-omega-3 fatty acids 1000 MG capsule levothyroxine (SYNTHROID, LEVOTHROID) 25 MCG tablet metoprolol tartrate (LOPRESSOR) 50 MG tablet Nutritional Supplements (VITAMIN D MAINTENANCE PO) tobramycin (TOBREX) 0.3 % ophthalmic solution Medications self-administered by patient taken the night of the study : ELIQUIS, METOPROLOL TARTRATE, AREDS II  SLEEP ARCHITECTURE The study was initiated at 10:19:58 PM and ended at 4:42:04 AM.  Sleep onset time was 49.9 minutes and the sleep efficiency was 67.3%%. The total sleep time was 257 minutes.  Stage REM latency was 116.0 minutes.  The patient spent 2.3%% of the night in stage N1 sleep, 82.5%% in stage N2 sleep, 0.0%% in stage N3 and 15.2% in REM.  Alpha intrusion was absent.  Supine sleep was 100.00%.  RESPIRATORY PARAMETERS The overall apnea/hypopnea index (AHI) was 4.9 per hour. There were 1 total apneas, including 1 obstructive, 0 central and 0 mixed apneas. There were 20 hypopneas and 6  RERAs.  The AHI during Stage REM sleep was 29.2 per hour.  AHI while supine was 4.9 per hour.  The mean oxygen saturation was 92.2%. The minimum SpO2 during sleep was 85.0%.  Soft snoring was noted during this study.  CARDIAC DATA The 2 lead EKG demonstrated sinus rhythm. The mean heart rate was 60.7 beats per minute. Other EKG findings include: None.  LEG MOVEMENT DATA The total PLMS were 0 with a resulting PLMS index of 0.0. Associated arousal with leg movement index was 0.0 .  IMPRESSIONS - Borderline sleep apnea (AHI 4.9/h; RDI 6.3/h) overall; however, moderately severe sleep apnea occurred during REM sleep (AHI 29.2/h). - No significant central sleep apnea occurred during this study (CAI = 0.0/h). - Mild oxygen desaturation to a nadir of 85.0%. - The patient snored with soft snoring volume. - No cardiac abnormalities were noted during this study. - Clinically significant periodic limb movements did not occur during sleep. No significant associated arousals.  DIAGNOSIS - Sleep apnea, unspecified (G47.30) - Nocturnal Hypoxemia (G47.36)  RECOMMENDATIONS - In this patient with borderline sleep apnea overall but moderately severe REM related sleep apnea, can consider CPAP titration study or Auto PAP if approved by insurance in this patient with PAF. However, alternatives to CPAP therapy such as a customized oral appliance can be considered.  - Effort should be made to optimize nasal and oropharyngeal patency.  - Avoid alcohol, sedatives and other CNS depressants that may worsen sleep apnea and disrupt normal sleep architecture. - Sleep hygiene should be reviewed to assess factors that may improve sleep quality. - Weight management (BMI 32) and regular exercise should be initiated or continued if appropriate.  [Electronically signed] 12/30/2019 02:32 PM  Shelva Majestic MD, [facility], [place], American Board  of Sleep Medicine   NPI: [id number] [place] PH: [phone]   FX: [phone] [place]

## 2020-01-02 MED ORDER — AMLODIPINE BESYLATE 5 MG PO TABS: 5.0000 mg | ORAL_TABLET | Freq: Every day | ORAL | 3 refills | Status: DC

## 2020-01-05 ENCOUNTER — Other Ambulatory Visit: Payer: Self-pay | Admitting: Internal Medicine

## 2020-01-05 DIAGNOSIS — Z1231 Encounter for screening mammogram for malignant neoplasm of breast: Secondary | ICD-10-CM

## 2020-01-06 DIAGNOSIS — H353211 Exudative age-related macular degeneration, right eye, with active choroidal neovascularization: Secondary | ICD-10-CM | POA: Diagnosis not present

## 2020-01-06 DIAGNOSIS — H353122 Nonexudative age-related macular degeneration, left eye, intermediate dry stage: Secondary | ICD-10-CM | POA: Diagnosis not present

## 2020-01-06 DIAGNOSIS — H43813 Vitreous degeneration, bilateral: Secondary | ICD-10-CM | POA: Diagnosis not present

## 2020-01-13 ENCOUNTER — Telehealth: Payer: Self-pay | Admitting: *Deleted

## 2020-01-13 NOTE — Telephone Encounter (Signed)
Left message called to discuss sleep study results and recommendations. Will call her again later.

## 2020-01-17 ENCOUNTER — Telehealth: Payer: Self-pay | Admitting: Cardiovascular Disease

## 2020-01-17 ENCOUNTER — Telehealth: Payer: Self-pay | Admitting: *Deleted

## 2020-01-17 NOTE — Telephone Encounter (Signed)
Routed to sleep coordinator-referral placed today

## 2020-01-17 NOTE — Telephone Encounter (Signed)
Patient and husband notified of sleep study results and recommendations. Patient chose to do consult with Dr Ron Parker for oral appliance. Order faxed to Dr Ron Parker office to contact patient.

## 2020-01-17 NOTE — Telephone Encounter (Signed)
° ° °  Sarag from Dr. Oneal Grout calling, she said they received a referral from Dr. Claiborne Billings, they need more information, latest sleep study, all pt's interaction and office notes. Also, both copy of pt's medicare cards. She gave fax# [phone]

## 2020-01-26 ENCOUNTER — Telehealth: Payer: Self-pay | Admitting: Cardiology

## 2020-01-26 NOTE — Telephone Encounter (Signed)
Patient calling with BP readings:  01/11/20: 137/67 HR 69 01/12/20:  147/81 HR 67 01/13/20:  124/68 HR 64 01/14/20:  148/80 HR 72 01/15/20:  144/76 HR 73 01/16/20:  115/84 HR 66 01/17/20:  122/69 HR 68 01/18/20:   133/64 HR 69 01/19/20:    138/72 HR 72 01/20/20:    128/63 HR 65 01/21/20:    132/70 HR 72 01/22/20:    124/70 HR 63 01/23/20:    134/67 HR 65 01/24/20:     128/68 HR 71

## 2020-01-27 NOTE — Telephone Encounter (Signed)
BP looks good, has remained SBP<140 over last week.  Would continue amlodipine 5 mg daily

## 2020-01-30 NOTE — Telephone Encounter (Signed)
Follow up     Sarah from Dr. Ron Parker office is calling to see about obtaining additional information for a referral they received. Forwarding note back to [place]

## 2020-01-30 NOTE — Telephone Encounter (Signed)
spoke with Carla Petersen to see what she needs. All notes and sleep study was faxed along with the referral. After looking through the records that she has, she informed me that she has everything she needs. Apologized for the calls. She states the "sleep study just didn't look the same."

## 2020-02-01 NOTE — Telephone Encounter (Signed)
Left detailed message with recommendations (ok per DPR)

## 2020-02-03 DIAGNOSIS — Z23 Encounter for immunization: Secondary | ICD-10-CM | POA: Diagnosis not present

## 2020-02-17 ENCOUNTER — Other Ambulatory Visit: Payer: Self-pay

## 2020-02-17 ENCOUNTER — Ambulatory Visit
Admission: RE | Admit: 2020-02-17 | Discharge: 2020-02-17 | Disposition: A | Discharge status: Home / Self Care | Payer: Medicare Other | Source: Ambulatory Visit | Attending: Internal Medicine | Admitting: Internal Medicine

## 2020-02-17 DIAGNOSIS — Z1231 Encounter for screening mammogram for malignant neoplasm of breast: Secondary | ICD-10-CM

## 2020-03-02 DIAGNOSIS — Z23 Encounter for immunization: Secondary | ICD-10-CM | POA: Diagnosis not present

## 2020-03-07 ENCOUNTER — Other Ambulatory Visit: Payer: Self-pay | Admitting: Cardiology

## 2020-03-07 DIAGNOSIS — I4891 Unspecified atrial fibrillation: Secondary | ICD-10-CM

## 2020-03-07 NOTE — Telephone Encounter (Signed)
Prescription refill request for Eliquis received. Indication: a fib Last office visit: 11/28/19 Scr: 1.14 Age: 79 Weight: 82.1 kg

## 2020-03-16 DIAGNOSIS — H353122 Nonexudative age-related macular degeneration, left eye, intermediate dry stage: Secondary | ICD-10-CM | POA: Diagnosis not present

## 2020-03-16 DIAGNOSIS — H43813 Vitreous degeneration, bilateral: Secondary | ICD-10-CM | POA: Diagnosis not present

## 2020-03-16 DIAGNOSIS — H353211 Exudative age-related macular degeneration, right eye, with active choroidal neovascularization: Secondary | ICD-10-CM | POA: Diagnosis not present

## 2020-03-27 ENCOUNTER — Other Ambulatory Visit: Payer: Self-pay | Admitting: Cardiology

## 2020-04-03 DIAGNOSIS — N1831 Chronic kidney disease, stage 3a: Secondary | ICD-10-CM | POA: Diagnosis not present

## 2020-04-03 DIAGNOSIS — D6869 Other thrombophilia: Secondary | ICD-10-CM | POA: Diagnosis not present

## 2020-04-03 DIAGNOSIS — E039 Hypothyroidism, unspecified: Secondary | ICD-10-CM | POA: Diagnosis not present

## 2020-04-03 DIAGNOSIS — E782 Mixed hyperlipidemia: Secondary | ICD-10-CM | POA: Diagnosis not present

## 2020-04-03 DIAGNOSIS — Z85038 Personal history of other malignant neoplasm of large intestine: Secondary | ICD-10-CM | POA: Diagnosis not present

## 2020-04-03 DIAGNOSIS — Z853 Personal history of malignant neoplasm of breast: Secondary | ICD-10-CM | POA: Diagnosis not present

## 2020-04-03 DIAGNOSIS — G4733 Obstructive sleep apnea (adult) (pediatric): Secondary | ICD-10-CM | POA: Diagnosis not present

## 2020-04-03 DIAGNOSIS — I4891 Unspecified atrial fibrillation: Secondary | ICD-10-CM | POA: Diagnosis not present

## 2020-04-03 DIAGNOSIS — R7309 Other abnormal glucose: Secondary | ICD-10-CM | POA: Diagnosis not present

## 2020-04-03 DIAGNOSIS — I10 Essential (primary) hypertension: Secondary | ICD-10-CM | POA: Diagnosis not present

## 2020-04-16 DIAGNOSIS — Z853 Personal history of malignant neoplasm of breast: Secondary | ICD-10-CM | POA: Diagnosis not present

## 2020-04-16 DIAGNOSIS — C50912 Malignant neoplasm of unspecified site of left female breast: Secondary | ICD-10-CM | POA: Diagnosis not present

## 2020-04-16 DIAGNOSIS — I10 Essential (primary) hypertension: Secondary | ICD-10-CM | POA: Diagnosis not present

## 2020-04-16 DIAGNOSIS — N182 Chronic kidney disease, stage 2 (mild): Secondary | ICD-10-CM | POA: Diagnosis not present

## 2020-04-16 DIAGNOSIS — C189 Malignant neoplasm of colon, unspecified: Secondary | ICD-10-CM | POA: Diagnosis not present

## 2020-04-16 DIAGNOSIS — Z85038 Personal history of other malignant neoplasm of large intestine: Secondary | ICD-10-CM | POA: Diagnosis not present

## 2020-04-16 DIAGNOSIS — E782 Mixed hyperlipidemia: Secondary | ICD-10-CM | POA: Diagnosis not present

## 2020-04-16 DIAGNOSIS — E039 Hypothyroidism, unspecified: Secondary | ICD-10-CM | POA: Diagnosis not present

## 2020-04-16 DIAGNOSIS — N1831 Chronic kidney disease, stage 3a: Secondary | ICD-10-CM | POA: Diagnosis not present

## 2020-04-16 DIAGNOSIS — M810 Age-related osteoporosis without current pathological fracture: Secondary | ICD-10-CM | POA: Diagnosis not present

## 2020-04-16 DIAGNOSIS — D509 Iron deficiency anemia, unspecified: Secondary | ICD-10-CM | POA: Diagnosis not present

## 2020-04-16 DIAGNOSIS — I4891 Unspecified atrial fibrillation: Secondary | ICD-10-CM | POA: Diagnosis not present

## 2020-04-20 DIAGNOSIS — C189 Malignant neoplasm of colon, unspecified: Secondary | ICD-10-CM | POA: Diagnosis not present

## 2020-04-20 DIAGNOSIS — N1831 Chronic kidney disease, stage 3a: Secondary | ICD-10-CM | POA: Diagnosis not present

## 2020-04-20 DIAGNOSIS — Z853 Personal history of malignant neoplasm of breast: Secondary | ICD-10-CM | POA: Diagnosis not present

## 2020-04-20 DIAGNOSIS — E039 Hypothyroidism, unspecified: Secondary | ICD-10-CM | POA: Diagnosis not present

## 2020-04-20 DIAGNOSIS — D509 Iron deficiency anemia, unspecified: Secondary | ICD-10-CM | POA: Diagnosis not present

## 2020-04-20 DIAGNOSIS — I4891 Unspecified atrial fibrillation: Secondary | ICD-10-CM | POA: Diagnosis not present

## 2020-04-20 DIAGNOSIS — C50912 Malignant neoplasm of unspecified site of left female breast: Secondary | ICD-10-CM | POA: Diagnosis not present

## 2020-04-20 DIAGNOSIS — M810 Age-related osteoporosis without current pathological fracture: Secondary | ICD-10-CM | POA: Diagnosis not present

## 2020-04-20 DIAGNOSIS — I10 Essential (primary) hypertension: Secondary | ICD-10-CM | POA: Diagnosis not present

## 2020-04-20 DIAGNOSIS — E782 Mixed hyperlipidemia: Secondary | ICD-10-CM | POA: Diagnosis not present

## 2020-04-20 DIAGNOSIS — N182 Chronic kidney disease, stage 2 (mild): Secondary | ICD-10-CM | POA: Diagnosis not present

## 2020-04-20 DIAGNOSIS — Z85038 Personal history of other malignant neoplasm of large intestine: Secondary | ICD-10-CM | POA: Diagnosis not present

## 2020-05-27 NOTE — Progress Notes (Signed)
Cardiology Office Note:    Date:  05/30/2020   ID:  [name], DOB 1940-09-28, MRN [id number]  PCP:  Wenda Low, MD  Cardiologist:  No primary care provider on file.  Electrophysiologist:  None   Referring MD: Wenda Low, MD   Chief Complaint  Patient presents with  . Atrial Fibrillation         History of Present Illness:    Carla Petersen is a 80 y.o. female with a hx of Graves' disease, hyperlipidemia, prediabetes, breast cancer who presents for follow-up.  She was referred by Dr. Lysle Rubens for evaluation of palpitations, initially seen on 10/14/2019.  She reports that she began having palpitations around 09/26/2019.  Feels like her heart is racing during episodes.  Since episodes started she will have some days where she does not have any episodes but some days where they occur multiple times per day.  She did check her pulse during one episode and was 138.  Episode last anywhere from 10 seconds to 1 minute.  She has felt lightheaded during episodes but denies any syncope.  Also feels abdominal pain and feels like something is stuck in her throat at times.  She denies any shortness of breath or chest pain.  No regular exercise.  She quit smoking in 1995.  She does not drink coffee but drinks one 8 ounce Coke per day.  She does not drink alcohol.  Echocardiogram on 11/07/2019 showed normal biventricular function, no significant valvular disease.  Zio patch x7 days on 11/10/2019 showed 1% atrial fibrillation/atrial flutter burden, with average heart rate 152 bpm and longest episode lasting for 4 minutes.  She was started on Eliquis 5 mg twice daily and metoprolol 25 mg twice daily.  Since last clinic visit, she reports that she has been doing well.  She denies any chest pain or dyspnea.  Reports occasional palpitations but has not felt like heart was racing.  Denies any lightheadedness or syncope.  Reports has been having intermittent left lower extremity edema.  Also having  intermittent headaches but has not checked BP during episodes.  She saw Dr. Ron Parker and was given an oral appliance but has not started using it yet.  She has been taking Eliquis, reports occasional blood in her stool which she has attributed to hemorrhoids.   Past Medical History:  Diagnosis Date  . Graves disease   . Personal history of radiation therapy    Left Breast Cancer    Past Surgical History:  Procedure Laterality Date  . BREAST EXCISIONAL BIOPSY Left   . BREAST EXCISIONAL BIOPSY Left   . BREAST LUMPECTOMY Left 1995    Current Medications: Current Meds  Medication Sig  . amLODipine (NORVASC) 5 MG tablet TAKE 1 TABLET BY MOUTH EVERY DAY  . ascorbic acid (VITAMIN C) 500 MG tablet Take by mouth.  . calcium carbonate (OS-CAL) 1250 (500 Ca) MG chewable tablet Chew by mouth.  Arne Cleveland 5 MG TABS tablet TAKE 1 TABLET BY MOUTH TWICE A DAY  . ferrous sulfate 220 (44 FE) MG/5ML solution Take 65 mg by mouth daily.  Javier Docker Oil 1000 MG CAPS Take by mouth.  . levothyroxine (SYNTHROID, LEVOTHROID) 25 MCG tablet Take 25 mcg by mouth daily.  . metoprolol tartrate (LOPRESSOR) 50 MG tablet Take 1 tablet (50 mg total) by mouth 2 (two) times daily.  . Multiple Vitamin (MULTI-VITAMIN) tablet Take 1 tablet by mouth daily.  . Multiple Vitamins-Minerals (PRESERVISION AREDS 2+MULTI VIT) CAPS   . Nutritional Supplements (  VITAMIN D MAINTENANCE PO) Take 800 Units by mouth daily.  . Omega-3 Fatty Acids (FISH OIL CONCENTRATE) 300 MG CAPS 1 capsule  . tobramycin (TOBREX) 0.3 % ophthalmic solution every 4 (four) hours.  . Vitamin D, Cholecalciferol, 25 MCG (1000 UT) CAPS 1 tablet  . [DISCONTINUED] aspirin 81 MG tablet Take 81 mg by mouth daily.     Allergies:   Latex, Penicillins, Sulfa antibiotics, and Pedi-pre tape spray [wound dressing adhesive]   Social History   Socioeconomic History  . Marital status: Married    Spouse name: Not on file  . Number of children: Not on file  . Years of  education: Not on file  . Highest education level: Not on file  Occupational History  . Not on file  Tobacco Use  . Smoking status: Former Research scientist (life sciences)  . Smokeless tobacco: Former Systems developer    Quit date: 07/06/1993  Substance and Sexual Activity  . Alcohol use: No  . Drug use: No  . Sexual activity: Not on file  Other Topics Concern  . Not on file  Social History Narrative  . Not on file   Social Determinants of Health   Financial Resource Strain: Not on file  Food Insecurity: Not on file  Transportation Needs: Not on file  Physical Activity: Not on file  Stress: Not on file  Social Connections: Not on file     Family History: The patient's family history is not on file.  ROS:   Please see the history of present illness.     All other systems reviewed and are negative.  EKGs/Labs/Other Studies Reviewed:    The following studies were reviewed today:   EKG:  EKG is ordered today.  The ekg ordered today demonstrates normal sinus rhythm, rate 70, no ST abnormalities  Recent Labs: 11/15/2019: BUN 20; Creatinine, Ser 1.14; Hemoglobin 17.2; Platelets 266; Potassium 4.1; Sodium 142  Recent Lipid Panel No results found for: CHOL, TRIG, HDL, CHOLHDL, VLDL, LDLCALC, LDLDIRECT  Physical Exam:    VS:  BP 124/70   Pulse 70   Ht 5\' 3"  (1.6 m)   Wt 181 lb (82.1 kg)   LMP 07/07/1983   SpO2 93%   BMI 32.06 kg/m     Wt Readings from Last 3 Encounters:  05/30/20 181 lb (82.1 kg)  12/23/19 181 lb (82.1 kg)  11/28/19 182 lb (82.6 kg)     GEN: in no acute distress HEENT: Normal NECK: No JVD; No carotid bruits CARDIAC: RRR, 2 out of 6 systolic heart murmur RESPIRATORY:  Clear to auscultation without rales, wheezing or rhonchi  ABDOMEN: Soft, non-tender, non-distended MUSCULOSKELETAL:  trace edema in left lower extremity SKIN: Warm and dry NEUROLOGIC:  Alert and oriented x 3 PSYCHIATRIC:  Normal affect   ASSESSMENT:    1. Paroxysmal atrial fibrillation (HCC)   2. Essential  hypertension   3. Medication management   4. Leg edema, left   5. OSA (obstructive sleep apnea)    PLAN:    Paroxysmal atrial fibrillation/atrial flutter: Zio patch x7 days on 11/10/2019 showed 1% atrial fibrillation/atrial flutter burden, with average heart rate 152 bpm and longest episode lasting for minutes.  She was started on Eliquis 5 mg twice daily and metoprolol 25 mg twice daily.  Echocardiogram on 11/07/2019 showed normal biventricular function, no significant valvular disease.  CHA2DS2-VASc score 3 (hypertension, age x2). -Continue Eliquis 5 mg twice daily.  Will check CBC -Continue metoprolol 50 mg twice daily -Sleep study study showed borderline OSA overall  but moderately severe REM related sleep apnea.  Patient electing to do oral appliance with Dr. Ron Parker.  Reports she has oral appliance was not started using, encouraged to use  Left lower extremity edema: Asymmetric LLE edema.  Will check lower extremity duplex to rule out DVT.  Check CMP, BNP.  If work-up unremarkable, suspect due to amlodipine use  Hypertension: On amlodipine 5 mg daily and metoprolol 50 mg twice daily  OSA: Planning oral appliance with Dr. Ron Parker  RTC in 3 months  Medication Adjustments/Labs and Tests Ordered: Current medicines are reviewed at length with the patient today.  Concerns regarding medicines are outlined above.  Orders Placed This Encounter  Procedures  . Comprehensive metabolic panel  . Brain natriuretic peptide  . CBC  . EKG 12-Lead  . VAS [place] LOWER EXTREMITY VENOUS (DVT)   No orders of the defined types were placed in this encounter.   Patient Instructions  Medication Instructions:  STOP Aspirin  *If you need a refill on your cardiac medications before your next appointment, please call your pharmacy*   Lab Work: CMET, BNP, CBC today  If you have labs (blood work) drawn today and your tests are completely normal, you will receive your results only by: Marland Kitchen MyChart Message (if you  have MyChart) OR . A paper copy in the mail If you have any lab test that is abnormal or we need to change your treatment, we will call you to review the results.   Testing/Procedures: LLE venous ultrasound (rule out DVT)  Follow-Up: At [facility], you and your health needs are our priority.  As part of our continuing mission to provide you with exceptional heart care, we have created designated Provider Care Teams.  These Care Teams include your primary Cardiologist (physician) and Advanced Practice Providers (APPs -  Physician Assistants and Nurse Practitioners) who all work together to provide you with the care you need, when you need it.  We recommend signing up for the patient portal called "MyChart".  Sign up information is provided on this After Visit Summary.  MyChart is used to connect with patients for Virtual Visits (Telemedicine).  Patients are able to view lab/test results, encounter notes, upcoming appointments, etc.  Non-urgent messages can be sent to your provider as well.   To learn more about what you can do with MyChart, go to [email or web address].    Your next appointment:   3 month(s)  The format for your next appointment:   In Person  Provider:   Oswaldo Milian, MD        Signed, Donato Heinz, MD  05/30/2020 2:08 PM    [facility]

## 2020-05-29 DIAGNOSIS — D509 Iron deficiency anemia, unspecified: Secondary | ICD-10-CM | POA: Diagnosis not present

## 2020-05-29 DIAGNOSIS — E039 Hypothyroidism, unspecified: Secondary | ICD-10-CM | POA: Diagnosis not present

## 2020-05-29 DIAGNOSIS — N1831 Chronic kidney disease, stage 3a: Secondary | ICD-10-CM | POA: Diagnosis not present

## 2020-05-29 DIAGNOSIS — Z853 Personal history of malignant neoplasm of breast: Secondary | ICD-10-CM | POA: Diagnosis not present

## 2020-05-29 DIAGNOSIS — M810 Age-related osteoporosis without current pathological fracture: Secondary | ICD-10-CM | POA: Diagnosis not present

## 2020-05-29 DIAGNOSIS — Z85038 Personal history of other malignant neoplasm of large intestine: Secondary | ICD-10-CM | POA: Diagnosis not present

## 2020-05-29 DIAGNOSIS — I10 Essential (primary) hypertension: Secondary | ICD-10-CM | POA: Diagnosis not present

## 2020-05-29 DIAGNOSIS — C189 Malignant neoplasm of colon, unspecified: Secondary | ICD-10-CM | POA: Diagnosis not present

## 2020-05-29 DIAGNOSIS — I4891 Unspecified atrial fibrillation: Secondary | ICD-10-CM | POA: Diagnosis not present

## 2020-05-29 DIAGNOSIS — C50912 Malignant neoplasm of unspecified site of left female breast: Secondary | ICD-10-CM | POA: Diagnosis not present

## 2020-05-29 DIAGNOSIS — E782 Mixed hyperlipidemia: Secondary | ICD-10-CM | POA: Diagnosis not present

## 2020-05-30 ENCOUNTER — Other Ambulatory Visit: Payer: Self-pay

## 2020-05-30 ENCOUNTER — Encounter: Payer: Self-pay | Admitting: Cardiology

## 2020-05-30 ENCOUNTER — Ambulatory Visit (INDEPENDENT_AMBULATORY_CARE_PROVIDER_SITE_OTHER): Payer: Medicare Other | Admitting: Cardiology

## 2020-05-30 VITALS — BP 124/70 | HR 70 | Ht 63.0 in | Wt 181.0 lb

## 2020-05-30 DIAGNOSIS — Z79899 Other long term (current) drug therapy: Secondary | ICD-10-CM | POA: Diagnosis not present

## 2020-05-30 DIAGNOSIS — R600 Localized edema: Secondary | ICD-10-CM

## 2020-05-30 DIAGNOSIS — I480 Paroxysmal atrial fibrillation: Secondary | ICD-10-CM

## 2020-05-30 DIAGNOSIS — I4891 Unspecified atrial fibrillation: Secondary | ICD-10-CM | POA: Diagnosis not present

## 2020-05-30 DIAGNOSIS — I10 Essential (primary) hypertension: Secondary | ICD-10-CM | POA: Diagnosis not present

## 2020-05-30 DIAGNOSIS — G4733 Obstructive sleep apnea (adult) (pediatric): Secondary | ICD-10-CM

## 2020-05-30 NOTE — Patient Instructions (Signed)
Medication Instructions:  STOP Aspirin  *If you need a refill on your cardiac medications before your next appointment, please call your pharmacy*   Lab Work: CMET, BNP, CBC today  If you have labs (blood work) drawn today and your tests are completely normal, you will receive your results only by: Marland Kitchen MyChart Message (if you have MyChart) OR . A paper copy in the mail If you have any lab test that is abnormal or we need to change your treatment, we will call you to review the results.   Testing/Procedures: LLE venous ultrasound (rule out DVT)  Follow-Up: At [facility], you and your health needs are our priority.  As part of our continuing mission to provide you with exceptional heart care, we have created designated Provider Care Teams.  These Care Teams include your primary Cardiologist (physician) and Advanced Practice Providers (APPs -  Physician Assistants and Nurse Practitioners) who all work together to provide you with the care you need, when you need it.  We recommend signing up for the patient portal called "MyChart".  Sign up information is provided on this After Visit Summary.  MyChart is used to connect with patients for Virtual Visits (Telemedicine).  Patients are able to view lab/test results, encounter notes, upcoming appointments, etc.  Non-urgent messages can be sent to your provider as well.   To learn more about what you can do with MyChart, go to [email or web address].    Your next appointment:   3 month(s)  The format for your next appointment:   In Person  Provider:   Oswaldo Milian, MD

## 2020-05-31 LAB — CBC
Hematocrit: 46.7 % — ABNORMAL HIGH (ref 34.0–46.6)
Hemoglobin: 15.8 g/dL (ref 11.1–15.9)
MCH: 31.9 pg (ref 26.6–33.0)
MCHC: 33.8 g/dL (ref 31.5–35.7)
MCV: 94 fL (ref 79–97)
Platelets: 262 10*3/uL (ref 150–450)
RBC: 4.95 x10E6/uL (ref 3.77–5.28)
RDW: 12.7 % (ref 11.7–15.4)
WBC: 8.4 10*3/uL (ref 3.4–10.8)

## 2020-05-31 LAB — COMPREHENSIVE METABOLIC PANEL
ALT: 20 IU/L (ref 0–32)
AST: 24 IU/L (ref 0–40)
Albumin/Globulin Ratio: 1.4 (ref 1.2–2.2)
Albumin: 4.3 g/dL (ref 3.6–4.6)
Alkaline Phosphatase: 142 IU/L — ABNORMAL HIGH (ref 44–121)
BUN/Creatinine Ratio: 15 (ref 12–28)
BUN: 17 mg/dL (ref 8–27)
Bilirubin Total: 0.5 mg/dL (ref 0.0–1.2)
CO2: 30 mmol/L — ABNORMAL HIGH (ref 20–29)
Calcium: 9.8 mg/dL (ref 8.7–10.3)
Chloride: 100 mmol/L (ref 96–106)
Creatinine, Ser: 1.14 mg/dL — ABNORMAL HIGH (ref 0.57–1.00)
GFR calc Af Amer: 52 mL/min/{1.73_m2} — ABNORMAL LOW (ref >59)
GFR calc non Af Amer: 45 mL/min/{1.73_m2} — ABNORMAL LOW (ref >59)
Globulin, Total: 3.1 g/dL (ref 1.5–4.5)
Glucose: 99 mg/dL (ref 65–99)
Potassium: 4.9 mmol/L (ref 3.5–5.2)
Sodium: 142 mmol/L (ref 134–144)
Total Protein: 7.4 g/dL (ref 6.0–8.5)

## 2020-05-31 LAB — BRAIN NATRIURETIC PEPTIDE: BNP: 39.6 pg/mL (ref 0.0–100.0)

## 2020-06-04 ENCOUNTER — Encounter (HOSPITAL_COMMUNITY): Payer: Medicare Other

## 2020-06-06 ENCOUNTER — Ambulatory Visit (HOSPITAL_COMMUNITY)
Admission: RE | Admit: 2020-06-06 | Discharge: 2020-06-06 | Disposition: A | Discharge status: Home / Self Care | Payer: Medicare Other | Source: Ambulatory Visit | Attending: Internal Medicine | Admitting: Internal Medicine

## 2020-06-06 ENCOUNTER — Other Ambulatory Visit: Payer: Self-pay

## 2020-06-06 DIAGNOSIS — R600 Localized edema: Secondary | ICD-10-CM | POA: Diagnosis not present

## 2020-06-07 DIAGNOSIS — L821 Other seborrheic keratosis: Secondary | ICD-10-CM | POA: Diagnosis not present

## 2020-06-07 DIAGNOSIS — D1801 Hemangioma of skin and subcutaneous tissue: Secondary | ICD-10-CM | POA: Diagnosis not present

## 2020-06-07 DIAGNOSIS — D225 Melanocytic nevi of trunk: Secondary | ICD-10-CM | POA: Diagnosis not present

## 2020-06-07 DIAGNOSIS — L853 Xerosis cutis: Secondary | ICD-10-CM | POA: Diagnosis not present

## 2020-06-07 DIAGNOSIS — L820 Inflamed seborrheic keratosis: Secondary | ICD-10-CM | POA: Diagnosis not present

## 2020-06-07 DIAGNOSIS — L718 Other rosacea: Secondary | ICD-10-CM | POA: Diagnosis not present

## 2020-07-05 ENCOUNTER — Other Ambulatory Visit: Payer: Self-pay | Admitting: Cardiology

## 2020-07-05 DIAGNOSIS — I4891 Unspecified atrial fibrillation: Secondary | ICD-10-CM

## 2020-07-05 NOTE — Telephone Encounter (Signed)
67f, 82.1kg, scr 1.14 (05/30/20), lovw/schumann (05/30/20), indication afib. Refill requested for eliquis 5mg  refill granted by Lucila Maine

## 2020-07-25 DIAGNOSIS — H353122 Nonexudative age-related macular degeneration, left eye, intermediate dry stage: Secondary | ICD-10-CM | POA: Diagnosis not present

## 2020-07-25 DIAGNOSIS — H43813 Vitreous degeneration, bilateral: Secondary | ICD-10-CM | POA: Diagnosis not present

## 2020-07-25 DIAGNOSIS — H353211 Exudative age-related macular degeneration, right eye, with active choroidal neovascularization: Secondary | ICD-10-CM | POA: Diagnosis not present

## 2020-07-27 DIAGNOSIS — N1831 Chronic kidney disease, stage 3a: Secondary | ICD-10-CM | POA: Diagnosis not present

## 2020-07-27 DIAGNOSIS — M810 Age-related osteoporosis without current pathological fracture: Secondary | ICD-10-CM | POA: Diagnosis not present

## 2020-07-27 DIAGNOSIS — Z853 Personal history of malignant neoplasm of breast: Secondary | ICD-10-CM | POA: Diagnosis not present

## 2020-07-27 DIAGNOSIS — N182 Chronic kidney disease, stage 2 (mild): Secondary | ICD-10-CM | POA: Diagnosis not present

## 2020-07-27 DIAGNOSIS — D509 Iron deficiency anemia, unspecified: Secondary | ICD-10-CM | POA: Diagnosis not present

## 2020-07-27 DIAGNOSIS — Z85038 Personal history of other malignant neoplasm of large intestine: Secondary | ICD-10-CM | POA: Diagnosis not present

## 2020-07-27 DIAGNOSIS — I4891 Unspecified atrial fibrillation: Secondary | ICD-10-CM | POA: Diagnosis not present

## 2020-07-27 DIAGNOSIS — C189 Malignant neoplasm of colon, unspecified: Secondary | ICD-10-CM | POA: Diagnosis not present

## 2020-07-27 DIAGNOSIS — E039 Hypothyroidism, unspecified: Secondary | ICD-10-CM | POA: Diagnosis not present

## 2020-07-27 DIAGNOSIS — E782 Mixed hyperlipidemia: Secondary | ICD-10-CM | POA: Diagnosis not present

## 2020-07-27 DIAGNOSIS — C50912 Malignant neoplasm of unspecified site of left female breast: Secondary | ICD-10-CM | POA: Diagnosis not present

## 2020-07-27 DIAGNOSIS — I10 Essential (primary) hypertension: Secondary | ICD-10-CM | POA: Diagnosis not present

## 2020-09-16 NOTE — Progress Notes (Deleted)
Cardiology Office Note:    Date:  09/16/2020   ID:  Carla Petersen, DOB 02-27-1941, MRN [id number]  PCP:  Wenda Low, MD  Cardiologist:  No primary care provider on file.  Electrophysiologist:  None   Referring MD: Wenda Low, MD   No chief complaint on file.   History of Present Illness:    Carla Petersen is a 80 y.o. female with a hx of Graves' disease, hyperlipidemia, prediabetes, breast cancer who presents for follow-up.  She was referred by Dr. Lysle Rubens for evaluation of palpitations, initially seen on 10/14/2019.  She reports that she began having palpitations around 09/26/2019.  Feels like her heart is racing during episodes.  Since episodes started she will have some days where she does not have any episodes but some days where they occur multiple times per day.  She did check her pulse during one episode and was 138.  Episode last anywhere from 10 seconds to 1 minute.  She has felt lightheaded during episodes but denies any syncope.  Also feels abdominal pain and feels like something is stuck in her throat at times.  She denies any shortness of breath or chest pain.  No regular exercise.  She quit smoking in 1995.  She does not drink coffee but drinks one 8 ounce Coke per day.  She does not drink alcohol.  Echocardiogram on 11/07/2019 showed normal biventricular function, no significant valvular disease.  Zio patch x7 days on 11/10/2019 showed 1% atrial fibrillation/atrial flutter burden, with average heart rate 152 bpm and longest episode lasting for 4 minutes.  She was started on Eliquis 5 mg twice daily and metoprolol 25 mg twice daily.  Since last clinic visit,    she reports that she has been doing well.  She denies any chest pain or dyspnea.  Reports occasional palpitations but has not felt like heart was racing.  Denies any lightheadedness or syncope.  Reports has been having intermittent left lower extremity edema.  Also having intermittent headaches but has not checked BP  during episodes.  She saw Dr. Ron Parker and was given an oral appliance but has not started using it yet.  She has been taking Eliquis, reports occasional blood in her stool which she has attributed to hemorrhoids.   Past Medical History:  Diagnosis Date  . Graves disease   . Personal history of radiation therapy    Left Breast Cancer    Past Surgical History:  Procedure Laterality Date  . BREAST EXCISIONAL BIOPSY Left   . BREAST EXCISIONAL BIOPSY Left   . BREAST LUMPECTOMY Left 1995    Current Medications: No outpatient medications have been marked as taking for the 09/17/20 encounter (Appointment) with Donato Heinz, MD.     Allergies:   Latex, Penicillins, Sulfa antibiotics, and Pedi-pre tape spray [wound dressing adhesive]   Social History   Socioeconomic History  . Marital status: Married    Spouse name: Not on file  . Number of children: Not on file  . Years of education: Not on file  . Highest education level: Not on file  Occupational History  . Not on file  Tobacco Use  . Smoking status: Former Research scientist (life sciences)  . Smokeless tobacco: Former Systems developer    Quit date: 07/06/1993  Substance and Sexual Activity  . Alcohol use: No  . Drug use: No  . Sexual activity: Not on file  Other Topics Concern  . Not on file  Social History Narrative  . Not on file   Social  Determinants of Health   Financial Resource Strain: Not on file  Food Insecurity: Not on file  Transportation Needs: Not on file  Physical Activity: Not on file  Stress: Not on file  Social Connections: Not on file     Family History: The patient's family history is not on file.  ROS:   Please see the history of present illness.     All other systems reviewed and are negative.  EKGs/Labs/Other Studies Reviewed:    The following studies were reviewed today:   EKG:  EKG is ordered today.  The ekg ordered today demonstrates normal sinus rhythm, rate 70, no ST abnormalities  Recent Labs: 05/30/2020: ALT  20; BNP 39.6; BUN 17; Creatinine, Ser 1.14; Hemoglobin 15.8; Platelets 262; Potassium 4.9; Sodium 142  Recent Lipid Panel No results found for: CHOL, TRIG, HDL, CHOLHDL, VLDL, LDLCALC, LDLDIRECT  Physical Exam:    VS:  LMP 07/07/1983     Wt Readings from Last 3 Encounters:  05/30/20 181 lb (82.1 kg)  12/23/19 181 lb (82.1 kg)  11/28/19 182 lb (82.6 kg)     GEN: in no acute distress HEENT: Normal NECK: No JVD; No carotid bruits CARDIAC: RRR, 2 out of 6 systolic heart murmur RESPIRATORY:  Clear to auscultation without rales, wheezing or rhonchi  ABDOMEN: Soft, non-tender, non-distended MUSCULOSKELETAL:  trace edema in left lower extremity SKIN: Warm and dry NEUROLOGIC:  Alert and oriented x 3 PSYCHIATRIC:  Normal affect   ASSESSMENT:    No diagnosis found. PLAN:    Paroxysmal atrial fibrillation/atrial flutter: Zio patch x7 days on 11/10/2019 showed 1% atrial fibrillation/atrial flutter burden, with average heart rate 152 bpm and longest episode lasting for minutes.  She was started on Eliquis 5 mg twice daily and metoprolol 25 mg twice daily.  Echocardiogram on 11/07/2019 showed normal biventricular function, no significant valvular disease.  CHA2DS2-VASc score 3 (hypertension, age x2). -Continue Eliquis 5 mg twice daily.   -Continue metoprolol 50 mg twice daily -Sleep study study showed borderline OSA overall but moderately severe REM related sleep apnea.  Patient electing to do oral appliance with Dr. Ron Parker.  Reports she has oral appliance was not started using, encouraged to use  Left lower extremity edema: Asymmetric LLE edema.  Lower extremity duplex showed no DVT.  Normal BNP, albumin.  Suspect due to amlodipine use  Hypertension: On amlodipine 5 mg daily and metoprolol 50 mg twice daily  OSA: Planning oral appliance with Dr. Ron Parker  RTC in***  Medication Adjustments/Labs and Tests Ordered: Current medicines are reviewed at length with the patient today.  Concerns  regarding medicines are outlined above.  No orders of the defined types were placed in this encounter.  No orders of the defined types were placed in this encounter.   There are no Patient Instructions on file for this visit.   Signed, Donato Heinz, MD  09/16/2020 2:10 PM    [facility]

## 2020-09-17 ENCOUNTER — Other Ambulatory Visit: Payer: Self-pay

## 2020-09-17 ENCOUNTER — Encounter: Payer: Self-pay | Admitting: Cardiology

## 2020-09-17 ENCOUNTER — Ambulatory Visit (INDEPENDENT_AMBULATORY_CARE_PROVIDER_SITE_OTHER): Payer: Medicare Other | Admitting: Cardiology

## 2020-09-17 VITALS — BP 118/58 | HR 71 | Ht 63.0 in | Wt 187.0 lb

## 2020-09-17 DIAGNOSIS — R600 Localized edema: Secondary | ICD-10-CM | POA: Diagnosis not present

## 2020-09-17 DIAGNOSIS — I10 Essential (primary) hypertension: Secondary | ICD-10-CM

## 2020-09-17 DIAGNOSIS — G4733 Obstructive sleep apnea (adult) (pediatric): Secondary | ICD-10-CM

## 2020-09-17 DIAGNOSIS — I480 Paroxysmal atrial fibrillation: Secondary | ICD-10-CM | POA: Diagnosis not present

## 2020-09-17 NOTE — Progress Notes (Deleted)
Cardiology Office Note:    Date:  09/17/2020   ID:  Carla Petersen, DOB [birth year], MRN [id number]  PCP:  Wenda Low, MD  Cardiologist:  No primary care provider on file.  Electrophysiologist:  None   Referring MD: Wenda Low, MD   No chief complaint on file.   History of Present Illness:    Carla Petersen is a 80 y.o. female with a hx of Graves' disease, hyperlipidemia, prediabetes, breast cancer who presents for follow-up.  She was referred by Dr. Lysle Rubens for evaluation of palpitations, initially seen on 10/14/2019.  She reports that she began having palpitations around 09/26/2019.  Feels like her heart is racing during episodes.  Since episodes started she will have some days where she does not have any episodes but some days where they occur multiple times per day.  She did check her pulse during one episode and was 138.  Episode last anywhere from 10 seconds to 1 minute.  She has felt lightheaded during episodes but denies any syncope.  Also feels abdominal pain and feels like something is stuck in her throat at times.  She denies any shortness of breath or chest pain.  No regular exercise.  She quit smoking in 1995.  She does not drink coffee but drinks one 8 ounce Coke per day.  She does not drink alcohol.  Echocardiogram on 11/07/2019 showed normal biventricular function, no significant valvular disease.  Zio patch x7 days on 11/10/2019 showed 1% atrial fibrillation/atrial flutter burden, with average heart rate 152 bpm and longest episode lasting for 4 minutes.  She was started on Eliquis 5 mg twice daily and metoprolol 25 mg twice daily.  Since last clinic visit,    she reports that she has been doing well.  She denies any chest pain or dyspnea.  Reports occasional palpitations but has not felt like heart was racing.  Denies any lightheadedness or syncope.  Reports has been having intermittent left lower extremity edema.  Also having intermittent headaches but has not checked BP  during episodes.  She saw Dr. Ron Parker and was given an oral appliance but has not started using it yet.  She has been taking Eliquis, reports occasional blood in her stool which she has attributed to hemorrhoids.   Past Medical History:  Diagnosis Date  . Graves disease   . Personal history of radiation therapy    Left Breast Cancer    Past Surgical History:  Procedure Laterality Date  . BREAST EXCISIONAL BIOPSY Left   . BREAST EXCISIONAL BIOPSY Left   . BREAST LUMPECTOMY Left 1995    Current Medications: Current Meds  Medication Sig  . amLODipine (NORVASC) 5 MG tablet TAKE 1 TABLET BY MOUTH EVERY DAY  . ascorbic acid (VITAMIN C) 500 MG tablet Take by mouth.  . calcium carbonate (OS-CAL) 1250 (500 Ca) MG chewable tablet Chew by mouth.  Arne Cleveland 5 MG TABS tablet TAKE 1 TABLET BY MOUTH TWICE A DAY  . ferrous sulfate 220 (44 FE) MG/5ML solution Take 65 mg by mouth daily.  Javier Docker Oil 1000 MG CAPS Take by mouth.  . levothyroxine (SYNTHROID, LEVOTHROID) 25 MCG tablet Take 25 mcg by mouth daily.  . metoprolol tartrate (LOPRESSOR) 50 MG tablet Take 1 tablet (50 mg total) by mouth 2 (two) times daily.  . Multiple Vitamins-Minerals (PRESERVISION AREDS 2+MULTI VIT) CAPS   . Nutritional Supplements (VITAMIN D MAINTENANCE PO) Take 800 Units by mouth daily.  . Omega-3 Fatty Acids (FISH OIL CONCENTRATE) 300 MG  CAPS 1 capsule  . tobramycin (TOBREX) 0.3 % ophthalmic solution every 4 (four) hours.  . Vitamin D, Cholecalciferol, 25 MCG (1000 UT) CAPS 1 tablet     Allergies:   Latex, Penicillins, Sulfa antibiotics, and Pedi-pre tape spray [wound dressing adhesive]   Social History   Socioeconomic History  . Marital status: Married    Spouse name: Not on file  . Number of children: Not on file  . Years of education: Not on file  . Highest education level: Not on file  Occupational History  . Not on file  Tobacco Use  . Smoking status: Former Research scientist (life sciences)  . Smokeless tobacco: Former Systems developer     Quit date: 07/06/1993  Substance and Sexual Activity  . Alcohol use: No  . Drug use: No  . Sexual activity: Not on file  Other Topics Concern  . Not on file  Social History Narrative  . Not on file   Social Determinants of Health   Financial Resource Strain: Not on file  Food Insecurity: Not on file  Transportation Needs: Not on file  Physical Activity: Not on file  Stress: Not on file  Social Connections: Not on file     Family History: The patient's family history is not on file.  ROS:   Please see the history of present illness.     All other systems reviewed and are negative.  EKGs/Labs/Other Studies Reviewed:    The following studies were reviewed today:   EKG:  EKG is ordered today.  The ekg ordered today demonstrates normal sinus rhythm, rate 70, no ST abnormalities  Recent Labs: 05/30/2020: ALT 20; BNP 39.6; BUN 17; Creatinine, Ser 1.14; Hemoglobin 15.8; Platelets 262; Potassium 4.9; Sodium 142  Recent Lipid Panel No results found for: CHOL, TRIG, HDL, CHOLHDL, VLDL, LDLCALC, LDLDIRECT  Physical Exam:    VS:  BP (!) 118/58   Pulse 71   Ht 5\' 3"  (1.6 m)   Wt 187 lb (84.8 kg)   LMP 07/07/1983   SpO2 93%   BMI 33.13 kg/m     Wt Readings from Last 3 Encounters:  09/17/20 187 lb (84.8 kg)  05/30/20 181 lb (82.1 kg)  12/23/19 181 lb (82.1 kg)     GEN: in no acute distress HEENT: Normal NECK: No JVD; No carotid bruits CARDIAC: RRR, 2 out of 6 systolic heart murmur RESPIRATORY:  Clear to auscultation without rales, wheezing or rhonchi  ABDOMEN: Soft, non-tender, non-distended MUSCULOSKELETAL:  trace edema in left lower extremity SKIN: Warm and dry NEUROLOGIC:  Alert and oriented x 3 PSYCHIATRIC:  Normal affect   ASSESSMENT:    1. Paroxysmal atrial fibrillation (HCC)    PLAN:    Paroxysmal atrial fibrillation/atrial flutter: Zio patch x7 days on 11/10/2019 showed 1% atrial fibrillation/atrial flutter burden, with average heart rate 152 bpm and  longest episode lasting for minutes.  She was started on Eliquis 5 mg twice daily and metoprolol 25 mg twice daily.  Echocardiogram on 11/07/2019 showed normal biventricular function, no significant valvular disease.  CHA2DS2-VASc score 3 (hypertension, age x2). -Continue Eliquis 5 mg twice daily.   -Continue metoprolol 50 mg twice daily -Sleep study study showed borderline OSA overall but moderately severe REM related sleep apnea.  Patient electing to do oral appliance with Dr. Ron Parker.  Reports she has oral appliance was not started using, encouraged to use  Left lower extremity edema: Asymmetric LLE edema.  Lower extremity duplex showed no DVT.  Normal BNP, albumin.  Suspect due to  amlodipine use  Hypertension: On amlodipine 5 mg daily and metoprolol 50 mg twice daily  OSA: Planning oral appliance with Dr. Ron Parker  RTC in***  Medication Adjustments/Labs and Tests Ordered: Current medicines are reviewed at length with the patient today.  Concerns regarding medicines are outlined above.  Orders Placed This Encounter  Procedures  . EKG 12-Lead   No orders of the defined types were placed in this encounter.   Patient Instructions  Medication Instructions:  Your physician recommends that you continue on your current medications as directed. Please refer to the Current Medication list given to you today.  *If you need a refill on your cardiac medications before your next appointment, please call your pharmacy*  Follow-Up: At [facility], you and your health needs are our priority.  As part of our continuing mission to provide you with exceptional heart care, we have created designated Provider Care Teams.  These Care Teams include your primary Cardiologist (physician) and Advanced Practice Providers (APPs -  Physician Assistants and Nurse Practitioners) who all work together to provide you with the care you need, when you need it.  We recommend signing up for the patient portal called  "MyChart".  Sign up information is provided on this After Visit Summary.  MyChart is used to connect with patients for Virtual Visits (Telemedicine).  Patients are able to view lab/test results, encounter notes, upcoming appointments, etc.  Non-urgent messages can be sent to your provider as well.   To learn more about what you can do with MyChart, go to [email or web address].    Your next appointment:   6 month(s)  The format for your next appointment:   In Person  Provider:   Oswaldo Milian, MD      Signed, Donato Heinz, MD  09/17/2020 1:48 PM    [place]

## 2020-09-17 NOTE — Progress Notes (Signed)
Cardiology Office Note:    Date:  09/17/2020   ID:  Carla Petersen, DOB 1940-07-17, MRN [id number]  PCP:  Wenda Low, MD  Cardiologist:  No primary care provider on file.  Electrophysiologist:  None   Referring MD: Wenda Low, MD   Chief Complaint  Patient presents with  . Atrial Fibrillation    History of Present Illness:    Carla Petersen is a 79 y.o. female with a hx of Graves' disease, hyperlipidemia, prediabetes, breast cancer who presents for follow-up.  She was referred by Dr. Lysle Rubens for evaluation of palpitations, initially seen on 10/14/2019.  She reports that she began having palpitations around 09/26/2019.  Feels like her heart is racing during episodes.  Since episodes started she will have some days where she does not have any episodes but some days where they occur multiple times per day.  She did check her pulse during one episode and was 138.  Episode last anywhere from 10 seconds to 1 minute.  She has felt lightheaded during episodes but denies any syncope.  Also feels abdominal pain and feels like something is stuck in her throat at times.  She denies any shortness of breath or chest pain.  No regular exercise.  She quit smoking in 1995.  She does not drink coffee but drinks one 8 ounce Coke per day.  She does not drink alcohol.  Echocardiogram on 11/07/2019 showed normal biventricular function, no significant valvular disease.  Zio patch x7 days on 11/10/2019 showed 1% atrial fibrillation/atrial flutter burden, with average heart rate 152 bpm and longest episode lasting for 4 minutes.  She was started on Eliquis 5 mg twice daily and metoprolol 25 mg twice daily.  Since last clinic visit, she reports that she has been doing well. A few times she believes she may have felt a minor "fluttery" episode of short duration, but is unsure if it was a palpitation. Occasionally, her fingers also feel like they are "buzzing" when she wakes up in the morning. Occasionally, she continues  to have bright red blood in her stool when it is too hard. She believes this has worsened since beginning the Eliquis. Her at home blood pressure is well controlled. She denies any chest pain or dyspnea. No lightheadedness, syncope, orthopnea, or PND. She has not seen Dr. Ron Parker about the oral appliance. She is concerned about using it because he told her that her dental work may be damaged by the appliance. Also, she is considering having the 4th COVID booster shot.   Past Medical History:  Diagnosis Date  . Graves disease   . Personal history of radiation therapy    Left Breast Cancer    Past Surgical History:  Procedure Laterality Date  . BREAST EXCISIONAL BIOPSY Left   . BREAST EXCISIONAL BIOPSY Left   . BREAST LUMPECTOMY Left 1995    Current Medications: Current Meds  Medication Sig  . amLODipine (NORVASC) 5 MG tablet TAKE 1 TABLET BY MOUTH EVERY DAY  . ascorbic acid (VITAMIN C) 500 MG tablet Take by mouth.  . calcium carbonate (OS-CAL) 1250 (500 Ca) MG chewable tablet Chew by mouth.  Arne Cleveland 5 MG TABS tablet TAKE 1 TABLET BY MOUTH TWICE A DAY  . ferrous sulfate 220 (44 FE) MG/5ML solution Take 65 mg by mouth daily.  Javier Docker Oil 1000 MG CAPS Take by mouth.  . levothyroxine (SYNTHROID, LEVOTHROID) 25 MCG tablet Take 25 mcg by mouth daily.  . metoprolol tartrate (LOPRESSOR) 50 MG tablet Take  1 tablet (50 mg total) by mouth 2 (two) times daily.  . Multiple Vitamins-Minerals (PRESERVISION AREDS 2+MULTI VIT) CAPS   . Nutritional Supplements (VITAMIN D MAINTENANCE PO) Take 800 Units by mouth daily.  . Omega-3 Fatty Acids (FISH OIL CONCENTRATE) 300 MG CAPS 1 capsule  . tobramycin (TOBREX) 0.3 % ophthalmic solution every 4 (four) hours.  . Vitamin D, Cholecalciferol, 25 MCG (1000 UT) CAPS 1 tablet     Allergies:   Latex, Penicillins, Sulfa antibiotics, and Pedi-pre tape spray [wound dressing adhesive]   Social History   Socioeconomic History  . Marital status: Married    Spouse  name: Not on file  . Number of children: Not on file  . Years of education: Not on file  . Highest education level: Not on file  Occupational History  . Not on file  Tobacco Use  . Smoking status: Former Research scientist (life sciences)  . Smokeless tobacco: Former Systems developer    Quit date: 07/06/1993  Substance and Sexual Activity  . Alcohol use: No  . Drug use: No  . Sexual activity: Not on file  Other Topics Concern  . Not on file  Social History Narrative  . Not on file   Social Determinants of Health   Financial Resource Strain: Not on file  Food Insecurity: Not on file  Transportation Needs: Not on file  Physical Activity: Not on file  Stress: Not on file  Social Connections: Not on file     Family History: The patient's family history is not on file.  ROS:   Please see the history of present illness. (+) Minor palpitations  All other systems reviewed and are negative.  EKGs/Labs/Other Studies Reviewed:    The following studies were reviewed today:   EKG:   05/30/2020: normal sinus rhythm, rate 70, no ST abnormalities 09/17/2020:  normal sinus rhythm, rate 71 bpm, no ST abnormalities  Recent Labs: 05/30/2020: ALT 20; BNP 39.6; BUN 17; Creatinine, Ser 1.14; Hemoglobin 15.8; Platelets 262; Potassium 4.9; Sodium 142  Recent Lipid Panel No results found for: CHOL, TRIG, HDL, CHOLHDL, VLDL, LDLCALC, LDLDIRECT  Physical Exam:    VS:  BP (!) 118/58   Pulse 71   Ht 5\' 3"  (1.6 m)   Wt 187 lb (84.8 kg)   LMP 07/07/1983   SpO2 93%   BMI 33.13 kg/m     Wt Readings from Last 3 Encounters:  09/17/20 187 lb (84.8 kg)  05/30/20 181 lb (82.1 kg)  12/23/19 181 lb (82.1 kg)     GEN: in no acute distress HEENT: Normal NECK: No JVD; No carotid bruits CARDIAC: RRR, 2 out of 6 systolic heart murmur RESPIRATORY:  Clear to auscultation without rales, wheezing or rhonchi  ABDOMEN: Soft, non-tender, non-distended MUSCULOSKELETAL:  trace edema in left lower extremity SKIN: Warm and dry NEUROLOGIC:   Alert and oriented x 3 PSYCHIATRIC:  Normal affect   ASSESSMENT:    1. Paroxysmal atrial fibrillation (HCC)   2. Leg edema, left   3. Essential hypertension   4. OSA (obstructive sleep apnea)    PLAN:    Paroxysmal atrial fibrillation/atrial flutter: Zio patch x7 days on 11/10/2019 showed 1% atrial fibrillation/atrial flutter burden, with average heart rate 152 bpm and longest episode lasting 4 minutes.  She was started on Eliquis 5 mg twice daily and metoprolol.  Echocardiogram on 11/07/2019 showed normal biventricular function, no significant valvular disease.  CHA2DS2-VASc score 3 (hypertension, age x2). -Continue Eliquis 5 mg twice daily. -Continue metoprolol 50 mg twice daily -  Sleep study study showed borderline OSA overall but moderately severe REM related sleep apnea.  Patient electing to do oral appliance with Dr. Ron Parker.  Reports she has oral appliance was not started using, encouraged to use  Left lower extremity edema: Asymmetric LLE edema.  Lower extremity duplex showed no DVT.  Normal BNP, albumin.  Suspect due to amlodipine use  Hypertension: On amlodipine 5 mg daily and metoprolol 50 mg twice daily.  Appears well controlled  OSA: treating with oral appliance with Dr. Ron Parker as above  RTC in 6 months.  Medication Adjustments/Labs and Tests Ordered: Current medicines are reviewed at length with the patient today.  Concerns regarding medicines are outlined above.  Orders Placed This Encounter  Procedures  . EKG 12-Lead   No orders of the defined types were placed in this encounter.   Patient Instructions  Medication Instructions:  Your physician recommends that you continue on your current medications as directed. Please refer to the Current Medication list given to you today.  *If you need a refill on your cardiac medications before your next appointment, please call your pharmacy*  Follow-Up: At [facility], you and your health needs are our priority.  As part of  our continuing mission to provide you with exceptional heart care, we have created designated Provider Care Teams.  These Care Teams include your primary Cardiologist (physician) and Advanced Practice Providers (APPs -  Physician Assistants and Nurse Practitioners) who all work together to provide you with the care you need, when you need it.  We recommend signing up for the patient portal called "MyChart".  Sign up information is provided on this After Visit Summary.  MyChart is used to connect with patients for Virtual Visits (Telemedicine).  Patients are able to view lab/test results, encounter notes, upcoming appointments, etc.  Non-urgent messages can be sent to your provider as well.   To learn more about what you can do with MyChart, go to [email or web address].    Your next appointment:   6 month(s)  The format for your next appointment:   In Person  Provider:   Oswaldo Milian, MD      [facility],acting as a scribe for Donato Heinz, MD.,have documented all relevant documentation on the behalf of Donato Heinz, MD,as directed by  Donato Heinz, MD while in the presence of Donato Heinz, MD.  I, Donato Heinz, MD, have reviewed all documentation for this visit. The documentation on 09/17/20 for the exam, diagnosis, procedures, and orders are all accurate and complete.   Signed, Donato Heinz, MD  09/17/2020 1:52 PM    [facility]

## 2020-09-17 NOTE — Patient Instructions (Signed)

## 2020-09-20 DIAGNOSIS — Z23 Encounter for immunization: Secondary | ICD-10-CM | POA: Diagnosis not present

## 2020-09-28 DIAGNOSIS — C189 Malignant neoplasm of colon, unspecified: Secondary | ICD-10-CM | POA: Diagnosis not present

## 2020-09-28 DIAGNOSIS — I10 Essential (primary) hypertension: Secondary | ICD-10-CM | POA: Diagnosis not present

## 2020-09-28 DIAGNOSIS — Z85038 Personal history of other malignant neoplasm of large intestine: Secondary | ICD-10-CM | POA: Diagnosis not present

## 2020-09-28 DIAGNOSIS — M810 Age-related osteoporosis without current pathological fracture: Secondary | ICD-10-CM | POA: Diagnosis not present

## 2020-09-28 DIAGNOSIS — I4891 Unspecified atrial fibrillation: Secondary | ICD-10-CM | POA: Diagnosis not present

## 2020-09-28 DIAGNOSIS — E039 Hypothyroidism, unspecified: Secondary | ICD-10-CM | POA: Diagnosis not present

## 2020-09-28 DIAGNOSIS — N1831 Chronic kidney disease, stage 3a: Secondary | ICD-10-CM | POA: Diagnosis not present

## 2020-09-28 DIAGNOSIS — M179 Osteoarthritis of knee, unspecified: Secondary | ICD-10-CM | POA: Diagnosis not present

## 2020-09-28 DIAGNOSIS — C50912 Malignant neoplasm of unspecified site of left female breast: Secondary | ICD-10-CM | POA: Diagnosis not present

## 2020-09-28 DIAGNOSIS — D509 Iron deficiency anemia, unspecified: Secondary | ICD-10-CM | POA: Diagnosis not present

## 2020-10-02 DIAGNOSIS — H43813 Vitreous degeneration, bilateral: Secondary | ICD-10-CM | POA: Diagnosis not present

## 2020-10-02 DIAGNOSIS — H353122 Nonexudative age-related macular degeneration, left eye, intermediate dry stage: Secondary | ICD-10-CM | POA: Diagnosis not present

## 2020-10-02 DIAGNOSIS — H353211 Exudative age-related macular degeneration, right eye, with active choroidal neovascularization: Secondary | ICD-10-CM | POA: Diagnosis not present

## 2020-10-03 DIAGNOSIS — M810 Age-related osteoporosis without current pathological fracture: Secondary | ICD-10-CM | POA: Diagnosis not present

## 2020-10-03 DIAGNOSIS — N1831 Chronic kidney disease, stage 3a: Secondary | ICD-10-CM | POA: Diagnosis not present

## 2020-10-03 DIAGNOSIS — I4891 Unspecified atrial fibrillation: Secondary | ICD-10-CM | POA: Diagnosis not present

## 2020-10-03 DIAGNOSIS — D6869 Other thrombophilia: Secondary | ICD-10-CM | POA: Diagnosis not present

## 2020-10-03 DIAGNOSIS — D509 Iron deficiency anemia, unspecified: Secondary | ICD-10-CM | POA: Diagnosis not present

## 2020-10-03 DIAGNOSIS — E782 Mixed hyperlipidemia: Secondary | ICD-10-CM | POA: Diagnosis not present

## 2020-10-03 DIAGNOSIS — Z853 Personal history of malignant neoplasm of breast: Secondary | ICD-10-CM | POA: Diagnosis not present

## 2020-10-03 DIAGNOSIS — G4733 Obstructive sleep apnea (adult) (pediatric): Secondary | ICD-10-CM | POA: Diagnosis not present

## 2020-10-03 DIAGNOSIS — Z1389 Encounter for screening for other disorder: Secondary | ICD-10-CM | POA: Diagnosis not present

## 2020-10-03 DIAGNOSIS — R7303 Prediabetes: Secondary | ICD-10-CM | POA: Diagnosis not present

## 2020-10-03 DIAGNOSIS — Z85038 Personal history of other malignant neoplasm of large intestine: Secondary | ICD-10-CM | POA: Diagnosis not present

## 2020-10-03 DIAGNOSIS — I10 Essential (primary) hypertension: Secondary | ICD-10-CM | POA: Diagnosis not present

## 2020-10-03 DIAGNOSIS — E039 Hypothyroidism, unspecified: Secondary | ICD-10-CM | POA: Diagnosis not present

## 2020-10-03 DIAGNOSIS — Z0000 Encounter for general adult medical examination without abnormal findings: Secondary | ICD-10-CM | POA: Diagnosis not present

## 2020-11-26 DIAGNOSIS — D509 Iron deficiency anemia, unspecified: Secondary | ICD-10-CM | POA: Diagnosis not present

## 2020-11-26 DIAGNOSIS — E782 Mixed hyperlipidemia: Secondary | ICD-10-CM | POA: Diagnosis not present

## 2020-11-26 DIAGNOSIS — I10 Essential (primary) hypertension: Secondary | ICD-10-CM | POA: Diagnosis not present

## 2020-11-26 DIAGNOSIS — I4891 Unspecified atrial fibrillation: Secondary | ICD-10-CM | POA: Diagnosis not present

## 2020-11-26 DIAGNOSIS — E039 Hypothyroidism, unspecified: Secondary | ICD-10-CM | POA: Diagnosis not present

## 2020-11-26 DIAGNOSIS — N1831 Chronic kidney disease, stage 3a: Secondary | ICD-10-CM | POA: Diagnosis not present

## 2020-11-26 DIAGNOSIS — M810 Age-related osteoporosis without current pathological fracture: Secondary | ICD-10-CM | POA: Diagnosis not present

## 2020-12-04 DIAGNOSIS — H353211 Exudative age-related macular degeneration, right eye, with active choroidal neovascularization: Secondary | ICD-10-CM | POA: Diagnosis not present

## 2020-12-04 DIAGNOSIS — H353122 Nonexudative age-related macular degeneration, left eye, intermediate dry stage: Secondary | ICD-10-CM | POA: Diagnosis not present

## 2020-12-04 DIAGNOSIS — H43813 Vitreous degeneration, bilateral: Secondary | ICD-10-CM | POA: Diagnosis not present

## 2020-12-24 ENCOUNTER — Other Ambulatory Visit: Payer: Self-pay | Admitting: Cardiology

## 2020-12-24 DIAGNOSIS — I4891 Unspecified atrial fibrillation: Secondary | ICD-10-CM

## 2020-12-24 NOTE — Telephone Encounter (Signed)
Eliquis mg refill request received. Patient is 80 years old, weight- 84.8 kg, Crea- 1.14, and seen by CS on 09/17/20. Dose is appropriate based on dosing criteria. Will send in refill to requested pharmacy.

## 2021-02-04 ENCOUNTER — Other Ambulatory Visit: Payer: Self-pay | Admitting: Internal Medicine

## 2021-02-04 DIAGNOSIS — Z1231 Encounter for screening mammogram for malignant neoplasm of breast: Secondary | ICD-10-CM

## 2021-02-05 DIAGNOSIS — H353122 Nonexudative age-related macular degeneration, left eye, intermediate dry stage: Secondary | ICD-10-CM | POA: Diagnosis not present

## 2021-02-05 DIAGNOSIS — H353211 Exudative age-related macular degeneration, right eye, with active choroidal neovascularization: Secondary | ICD-10-CM | POA: Diagnosis not present

## 2021-02-05 DIAGNOSIS — H43813 Vitreous degeneration, bilateral: Secondary | ICD-10-CM | POA: Diagnosis not present

## 2021-02-28 DIAGNOSIS — Z23 Encounter for immunization: Secondary | ICD-10-CM | POA: Diagnosis not present

## 2021-03-06 DIAGNOSIS — Z23 Encounter for immunization: Secondary | ICD-10-CM | POA: Diagnosis not present

## 2021-03-08 DIAGNOSIS — Z20822 Contact with and (suspected) exposure to covid-19: Secondary | ICD-10-CM | POA: Diagnosis not present

## 2021-03-22 DIAGNOSIS — Z85038 Personal history of other malignant neoplasm of large intestine: Secondary | ICD-10-CM | POA: Diagnosis not present

## 2021-03-22 DIAGNOSIS — Z86010 Personal history of colonic polyps: Secondary | ICD-10-CM | POA: Diagnosis not present

## 2021-03-22 DIAGNOSIS — Z7901 Long term (current) use of anticoagulants: Secondary | ICD-10-CM | POA: Diagnosis not present

## 2021-03-22 DIAGNOSIS — K625 Hemorrhage of anus and rectum: Secondary | ICD-10-CM | POA: Diagnosis not present

## 2021-03-22 DIAGNOSIS — R141 Gas pain: Secondary | ICD-10-CM | POA: Diagnosis not present

## 2021-03-25 ENCOUNTER — Telehealth: Payer: Self-pay | Admitting: *Deleted

## 2021-03-25 ENCOUNTER — Other Ambulatory Visit: Payer: Self-pay | Admitting: Cardiology

## 2021-03-25 NOTE — Telephone Encounter (Signed)
   Pre-operative Risk Assessment    Patient Name: Carla Petersen  DOB: 01-14-1941 MRN: [id number]      Request for Surgical Clearance   Procedure:   COLONOSCOPY  Date of Surgery: Clearance 07/03/21                                 Surgeon:  DR. Michail Sermon Surgeon's Group or Practice Name:  [facility] Phone number:  [phone] Fax number:  [phone]   Type of Clearance Requested: - Medical  - Pharmacy:  Hold Apixaban (Eliquis)     Type of Anesthesia:   PROPOFOL   Additional requests/questions:   Jiles Prows   03/25/2021, 5:22 PM

## 2021-03-25 NOTE — Telephone Encounter (Signed)
This is Dr. Schumann's pt 

## 2021-03-26 NOTE — Telephone Encounter (Signed)
Clinical pharmacist to review Eliquis 

## 2021-03-26 NOTE — Telephone Encounter (Signed)
Patient with diagnosis of atrial fibrillation on Eliquis for anticoagulation.    Procedure: colonoscopy Date of procedure: 07/03/20   CHA2DS2-VASc Score = 4   This indicates a 4.8% annual risk of stroke. The patient's score is based upon: CHF History: 0 HTN History: 1 Diabetes History: 0 Stroke History: 0 Vascular Disease History: 0 Age Score: 2 Gender Score: 1   CrCl 39 (with adjusted body weight) Platelet count 262  Per office protocol, patient can hold Eliquis for 2 days prior to procedure.   Patient will not need bridging with Lovenox (enoxaparin) around procedure.

## 2021-03-27 NOTE — Telephone Encounter (Signed)
Colonoscopy is not scheduled until February 2023. Patient has an appointment with Dr. Gardiner Rhyme scheduled for next week on 04/02/2021. Therefore, pre-op assessment can be completed at that time. I will route this clearance form to Dr. Gardiner Rhyme so that he is aware and add "pre-op eval" to appointment notes for that day. Will go ahead and remove from pre-op pool.   Darreld Mclean, PA-C 03/27/2021 7:35 AM

## 2021-03-31 NOTE — Progress Notes (Signed)
Cardiology Office Note:    Date:  04/02/2021   ID:  Carla Petersen, DOB [birth year], MRN [id number]  PCP:  Wenda Low, MD  Cardiologist:  None  Electrophysiologist:  None   Referring MD: Wenda Low, MD   Chief Complaint  Patient presents with   Atrial Fibrillation     History of Present Illness:    Carla Petersen is a 80 y.o. female with a hx of Graves' disease, hyperlipidemia, prediabetes, breast cancer who presents for follow-up.  She was referred by Dr. Lysle Rubens for evaluation of palpitations, initially seen on 10/14/2019.  She reports that she began having palpitations around 09/26/2019.  Feels like her heart is racing during episodes.  Since episodes started she will have some days where she does not have any episodes but some days where they occur multiple times per day.  She did check her pulse during one episode and was 138.  Episode last anywhere from 10 seconds to 1 minute.  She has felt lightheaded during episodes but denies any syncope.  Also feels abdominal pain and feels like something is stuck in her throat at times.  She denies any shortness of breath or chest pain.  No regular exercise.  She quit smoking in 1995.  She does not drink coffee but drinks one 8 ounce Coke per day.  She does not drink alcohol.  Echocardiogram on 11/07/2019 showed normal biventricular function, no significant valvular disease.  Zio patch x7 days on 11/10/2019 showed 1% atrial fibrillation/atrial flutter burden, with average heart rate 152 bpm and longest episode lasting for 4 minutes.  She was started on Eliquis 5 mg twice daily and metoprolol 25 mg twice daily.  Since last clinic visit, she reports has been doing well.  Denies any chest pain, dyspnea, lightheadedness, syncope, or palpitations.  Does report that she feels weak when she stands after she has been sitting for a while.  Does report some lower extremity edema.  Taking Eliquis, has noted some blood on toilet paper but not in the toilet  bowl.  Planning colonoscopy in 06/2021.  She has not been walking because of knee pain.  Can walk up flight of stairs without chest pain or dyspnea.    Past Medical History:  Diagnosis Date   Graves disease    Personal history of radiation therapy    Left Breast Cancer    Past Surgical History:  Procedure Laterality Date   BREAST EXCISIONAL BIOPSY Left    BREAST EXCISIONAL BIOPSY Left    BREAST LUMPECTOMY Left 1995    Current Medications: Current Meds  Medication Sig   amLODipine (NORVASC) 5 MG tablet TAKE 1 TABLET BY MOUTH EVERY DAY   ascorbic acid (VITAMIN C) 500 MG tablet Take by mouth.   calcium carbonate (OS-CAL) 1250 (500 Ca) MG chewable tablet Chew by mouth.   ELIQUIS 5 MG TABS tablet TAKE 1 TABLET BY MOUTH TWICE A DAY   ferrous sulfate 220 (44 FE) MG/5ML solution Take 65 mg by mouth daily.   Krill Oil 1000 MG CAPS Take by mouth.   levothyroxine (SYNTHROID, LEVOTHROID) 25 MCG tablet Take 25 mcg by mouth daily.   metoprolol tartrate (LOPRESSOR) 50 MG tablet TAKE 1 TABLET BY MOUTH TWICE A DAY   Multiple Vitamins-Minerals (PRESERVISION AREDS 2+MULTI VIT) CAPS    Nutritional Supplements (VITAMIN D MAINTENANCE PO) Take 800 Units by mouth daily.   Omega-3 Fatty Acids (FISH OIL CONCENTRATE) 300 MG CAPS 1 capsule   tobramycin (TOBREX) 0.3 % ophthalmic solution every  4 (four) hours.   Vitamin D, Cholecalciferol, 25 MCG (1000 UT) CAPS 1 tablet   [DISCONTINUED] iron polysaccharides (NIFEREX) 150 MG capsule Take 1 capsule by mouth daily.     Allergies:   Latex, Penicillins, Sulfa antibiotics, and Pedi-pre tape spray [wound dressing adhesive]   Social History   Socioeconomic History   Marital status: Married    Spouse name: Not on file   Number of children: Not on file   Years of education: Not on file   Highest education level: Not on file  Occupational History   Not on file  Tobacco Use   Smoking status: Former   Smokeless tobacco: Former    Quit date: 07/06/1993   Substance and Sexual Activity   Alcohol use: No   Drug use: No   Sexual activity: Not on file  Other Topics Concern   Not on file  Social History Narrative   Not on file   Social Determinants of Health   Financial Resource Strain: Not on file  Food Insecurity: Not on file  Transportation Needs: Not on file  Physical Activity: Not on file  Stress: Not on file  Social Connections: Not on file     Family History: The patient's family history is not on file.  ROS:   Please see the history of present illness. All other systems reviewed and are negative.  EKGs/Labs/Other Studies Reviewed:    The following studies were reviewed today:   EKG:   04/02/21: Normal sinus rhythm, rate 74, no ST abnormalities 05/30/2020: normal sinus rhythm, rate 70, no ST abnormalities 09/17/2020:  normal sinus rhythm, rate 71 bpm, no ST abnormalities  Recent Labs: 05/30/2020: ALT 20; BNP 39.6; BUN 17; Creatinine, Ser 1.14; Hemoglobin 15.8; Platelets 262; Potassium 4.9; Sodium 142  Recent Lipid Panel No results found for: CHOL, TRIG, HDL, CHOLHDL, VLDL, LDLCALC, LDLDIRECT  Physical Exam:    VS:  BP 132/72   Pulse 74   Ht 5\' 3"  (1.6 m)   Wt 193 lb 6.4 oz (87.7 kg)   LMP 07/07/1983   SpO2 93%   BMI 34.26 kg/m     Wt Readings from Last 3 Encounters:  04/02/21 193 lb 6.4 oz (87.7 kg)  09/17/20 187 lb (84.8 kg)  05/30/20 181 lb (82.1 kg)     GEN: in no acute distress HEENT: Normal NECK: No JVD; No carotid bruits CARDIAC: RRR, 2 out of 6 systolic heart murmur RESPIRATORY:  Clear to auscultation without rales, wheezing or rhonchi  ABDOMEN: Soft, non-tender, non-distended MUSCULOSKELETAL:  trace edema in left lower extremity SKIN: Warm and dry NEUROLOGIC:  Alert and oriented x 3 PSYCHIATRIC:  Normal affect   ASSESSMENT:    1. Pre-op evaluation   2. Paroxysmal atrial fibrillation (HCC)   3. Current use of long term anticoagulation   4. Essential hypertension   5. Hematochezia      PLAN:    Preop evaluation: Prior to colonoscopy.  Denies any exertional chest pain or dyspnea.  Functional capacity is limited but appears >4 METS as can walk up flight of stairs without stopping.  RCRI score 0.  Given low risk procedure, no further cardiac work-up recommended prior to procedure.  Okay to hold Eliquis for 48 hours prior to procedure.  Paroxysmal atrial fibrillation/atrial flutter: Zio patch x7 days on 11/10/2019 showed 1% atrial fibrillation/atrial flutter burden, with average heart rate 152 bpm and longest episode lasting 4 minutes.  She was started on Eliquis 5 mg twice daily and metoprolol.  Echocardiogram on 11/07/2019 showed normal biventricular function, no significant valvular disease.  CHA2DS2-VASc score 3 (hypertension, age x2). -Continue Eliquis 5 mg twice daily. -Continue metoprolol 50 mg twice daily -Sleep study  showed borderline OSA overall but moderately severe REM related sleep apnea.  Patient electing to do oral appliance with Dr. Ron Parker.  Reports she has oral appliance was not started using, encouraged to use  Hematochezia: Reports occasional blood on toilet paper.  Planning colonoscopy as above.  Will check CBC.    Left lower extremity edema: Asymmetric LLE edema.  Lower extremity duplex showed no DVT.  Normal BNP, albumin.  Suspect due to amlodipine use  Hypertension: On amlodipine 5 mg daily and metoprolol 50 mg twice daily.  Appears well controlled  OSA: treating with oral appliance with Dr. Ron Parker as above   RTC in 6 months  Medication Adjustments/Labs and Tests Ordered: Current medicines are reviewed at length with the patient today.  Concerns regarding medicines are outlined above.  Orders Placed This Encounter  Procedures   Comprehensive metabolic panel   CBC   EKG 12-Lead    No orders of the defined types were placed in this encounter.   Patient Instructions  Medication Instructions:  Your physician recommends that you continue on your  current medications as directed. Please refer to the Current Medication list given to you today.  *If you need a refill on your cardiac medications before your next appointment, please call your pharmacy*   Lab Work: CMET, CBC today  If you have labs (blood work) drawn today and your tests are completely normal, you will receive your results only by: [place] (if you have MyChart) OR A paper copy in the mail If you have any lab test that is abnormal or we need to change your treatment, we will call you to review the results.  Follow-Up: At [facility], you and your health needs are our priority.  As part of our continuing mission to provide you with exceptional heart care, we have created designated Provider Care Teams.  These Care Teams include your primary Cardiologist (physician) and Advanced Practice Providers (APPs -  Physician Assistants and Nurse Practitioners) who all work together to provide you with the care you need, when you need it.  We recommend signing up for the patient portal called "MyChart".  Sign up information is provided on this After Visit Summary.  MyChart is used to connect with patients for Virtual Visits (Telemedicine).  Patients are able to view lab/test results, encounter notes, upcoming appointments, etc.  Non-urgent messages can be sent to your provider as well.   To learn more about what you can do with MyChart, go to [email or web address].    Your next appointment:   6 month(s)  The format for your next appointment:   In Person  Provider:   Dr. Gardiner Rhyme      Signed, Donato Heinz, MD  04/02/2021 2:48 PM    [place]

## 2021-04-02 ENCOUNTER — Other Ambulatory Visit: Payer: Self-pay

## 2021-04-02 ENCOUNTER — Ambulatory Visit (INDEPENDENT_AMBULATORY_CARE_PROVIDER_SITE_OTHER): Payer: Medicare Other | Admitting: Cardiology

## 2021-04-02 VITALS — BP 132/72 | HR 74 | Ht 63.0 in | Wt 193.4 lb

## 2021-04-02 DIAGNOSIS — K921 Melena: Secondary | ICD-10-CM | POA: Diagnosis not present

## 2021-04-02 DIAGNOSIS — I480 Paroxysmal atrial fibrillation: Secondary | ICD-10-CM | POA: Diagnosis not present

## 2021-04-02 DIAGNOSIS — Z7901 Long term (current) use of anticoagulants: Secondary | ICD-10-CM | POA: Diagnosis not present

## 2021-04-02 DIAGNOSIS — Z01810 Encounter for preprocedural cardiovascular examination: Secondary | ICD-10-CM

## 2021-04-02 DIAGNOSIS — I10 Essential (primary) hypertension: Secondary | ICD-10-CM | POA: Diagnosis not present

## 2021-04-02 DIAGNOSIS — Z01818 Encounter for other preprocedural examination: Secondary | ICD-10-CM

## 2021-04-02 NOTE — Patient Instructions (Signed)
Medication Instructions:  Your physician recommends that you continue on your current medications as directed. Please refer to the Current Medication list given to you today.  *If you need a refill on your cardiac medications before your next appointment, please call your pharmacy*   Lab Work: CMET, CBC today  If you have labs (blood work) drawn today and your tests are completely normal, you will receive your results only by: [place] (if you have MyChart) OR A paper copy in the mail If you have any lab test that is abnormal or we need to change your treatment, we will call you to review the results.  Follow-Up: At [facility], you and your health needs are our priority.  As part of our continuing mission to provide you with exceptional heart care, we have created designated Provider Care Teams.  These Care Teams include your primary Cardiologist (physician) and Advanced Practice Providers (APPs -  Physician Assistants and Nurse Practitioners) who all work together to provide you with the care you need, when you need it.  We recommend signing up for the patient portal called "MyChart".  Sign up information is provided on this After Visit Summary.  MyChart is used to connect with patients for Virtual Visits (Telemedicine).  Patients are able to view lab/test results, encounter notes, upcoming appointments, etc.  Non-urgent messages can be sent to your provider as well.   To learn more about what you can do with MyChart, go to [email or web address].    Your next appointment:   6 month(s)  The format for your next appointment:   In Person  Provider:   Dr. Gardiner Rhyme

## 2021-04-03 ENCOUNTER — Ambulatory Visit
Admission: RE | Admit: 2021-04-03 | Discharge: 2021-04-03 | Disposition: A | Discharge status: Home / Self Care | Payer: Medicare Other | Source: Ambulatory Visit | Attending: Internal Medicine | Admitting: Internal Medicine

## 2021-04-03 DIAGNOSIS — Z1231 Encounter for screening mammogram for malignant neoplasm of breast: Secondary | ICD-10-CM | POA: Diagnosis not present

## 2021-04-03 LAB — CBC
Hematocrit: 45.7 % (ref 34.0–46.6)
Hemoglobin: 15.2 g/dL (ref 11.1–15.9)
MCH: 31.6 pg (ref 26.6–33.0)
MCHC: 33.3 g/dL (ref 31.5–35.7)
MCV: 95 fL (ref 79–97)
Platelets: 220 10*3/uL (ref 150–450)
RBC: 4.81 x10E6/uL (ref 3.77–5.28)
RDW: 12.7 % (ref 11.7–15.4)
WBC: 8.5 10*3/uL (ref 3.4–10.8)

## 2021-04-03 LAB — COMPREHENSIVE METABOLIC PANEL
ALT: 18 IU/L (ref 0–32)
AST: 22 IU/L (ref 0–40)
Albumin/Globulin Ratio: 1.4 (ref 1.2–2.2)
Albumin: 4.2 g/dL (ref 3.6–4.6)
Alkaline Phosphatase: 103 IU/L (ref 44–121)
BUN/Creatinine Ratio: 15 (ref 12–28)
BUN: 14 mg/dL (ref 8–27)
Bilirubin Total: 0.6 mg/dL (ref 0.0–1.2)
CO2: 28 mmol/L (ref 20–29)
Calcium: 9.6 mg/dL (ref 8.7–10.3)
Chloride: 99 mmol/L (ref 96–106)
Creatinine, Ser: 0.96 mg/dL (ref 0.57–1.00)
Globulin, Total: 3.1 g/dL (ref 1.5–4.5)
Glucose: 89 mg/dL (ref 70–99)
Potassium: 4.6 mmol/L (ref 3.5–5.2)
Sodium: 142 mmol/L (ref 134–144)
Total Protein: 7.3 g/dL (ref 6.0–8.5)
eGFR: 59 mL/min/{1.73_m2} — ABNORMAL LOW (ref >59)

## 2021-04-04 ENCOUNTER — Other Ambulatory Visit: Payer: Self-pay | Admitting: Internal Medicine

## 2021-04-04 DIAGNOSIS — R928 Other abnormal and inconclusive findings on diagnostic imaging of breast: Secondary | ICD-10-CM

## 2021-04-16 DIAGNOSIS — H353122 Nonexudative age-related macular degeneration, left eye, intermediate dry stage: Secondary | ICD-10-CM | POA: Diagnosis not present

## 2021-04-16 DIAGNOSIS — H353211 Exudative age-related macular degeneration, right eye, with active choroidal neovascularization: Secondary | ICD-10-CM | POA: Diagnosis not present

## 2021-04-16 DIAGNOSIS — H43813 Vitreous degeneration, bilateral: Secondary | ICD-10-CM | POA: Diagnosis not present

## 2021-04-29 DIAGNOSIS — E782 Mixed hyperlipidemia: Secondary | ICD-10-CM | POA: Diagnosis not present

## 2021-04-29 DIAGNOSIS — D509 Iron deficiency anemia, unspecified: Secondary | ICD-10-CM | POA: Diagnosis not present

## 2021-04-29 DIAGNOSIS — M810 Age-related osteoporosis without current pathological fracture: Secondary | ICD-10-CM | POA: Diagnosis not present

## 2021-04-29 DIAGNOSIS — I4891 Unspecified atrial fibrillation: Secondary | ICD-10-CM | POA: Diagnosis not present

## 2021-04-29 DIAGNOSIS — E039 Hypothyroidism, unspecified: Secondary | ICD-10-CM | POA: Diagnosis not present

## 2021-04-29 DIAGNOSIS — N1831 Chronic kidney disease, stage 3a: Secondary | ICD-10-CM | POA: Diagnosis not present

## 2021-04-29 DIAGNOSIS — I10 Essential (primary) hypertension: Secondary | ICD-10-CM | POA: Diagnosis not present

## 2021-04-29 DIAGNOSIS — M179 Osteoarthritis of knee, unspecified: Secondary | ICD-10-CM | POA: Diagnosis not present

## 2021-05-03 ENCOUNTER — Ambulatory Visit
Admission: RE | Admit: 2021-05-03 | Discharge: 2021-05-03 | Disposition: A | Discharge status: Home / Self Care | Payer: Medicare Other | Source: Ambulatory Visit | Attending: Internal Medicine | Admitting: Internal Medicine

## 2021-05-03 ENCOUNTER — Other Ambulatory Visit: Payer: Self-pay | Admitting: Internal Medicine

## 2021-05-03 DIAGNOSIS — R928 Other abnormal and inconclusive findings on diagnostic imaging of breast: Secondary | ICD-10-CM

## 2021-05-03 DIAGNOSIS — R922 Inconclusive mammogram: Secondary | ICD-10-CM | POA: Diagnosis not present

## 2021-05-03 DIAGNOSIS — R921 Mammographic calcification found on diagnostic imaging of breast: Secondary | ICD-10-CM | POA: Diagnosis not present

## 2021-06-05 NOTE — Telephone Encounter (Signed)
° ° °  Patient Name: Carla Petersen  DOB: [birth year] MRN: [id number]  Primary Cardiologist: Donato Heinz, MD  Chart reviewed as part of pre-operative protocol coverage. Given past medical history and time since last visit, based on ACC/AHA guidelines, Carla Petersen would be at acceptable risk for the planned procedure without further cardiovascular testing.   Per Dr. Newman Nickels office note on 04/02/2021, " Functional capacity is limited but appears >4 METS as can walk up flight of stairs without stopping.  RCRI score 0.  Given low risk procedure, no further cardiac work-up recommended prior to procedure.  Okay to hold Eliquis for 48 hours prior to procedure."  The patient was advised that if she develops new symptoms prior to surgery to contact our office to arrange for a follow-up visit, and she verbalized understanding.  I will route this recommendation to the requesting party via Epic fax function and remove from pre-op pool.  Please call with questions.  [place], [place] 06/05/2021, 11:34 AM

## 2021-06-26 DIAGNOSIS — H353122 Nonexudative age-related macular degeneration, left eye, intermediate dry stage: Secondary | ICD-10-CM | POA: Diagnosis not present

## 2021-06-26 DIAGNOSIS — H353211 Exudative age-related macular degeneration, right eye, with active choroidal neovascularization: Secondary | ICD-10-CM | POA: Diagnosis not present

## 2021-06-26 DIAGNOSIS — H43813 Vitreous degeneration, bilateral: Secondary | ICD-10-CM | POA: Diagnosis not present

## 2021-07-03 DIAGNOSIS — D123 Benign neoplasm of transverse colon: Secondary | ICD-10-CM | POA: Diagnosis not present

## 2021-07-03 DIAGNOSIS — K648 Other hemorrhoids: Secondary | ICD-10-CM | POA: Diagnosis not present

## 2021-07-03 DIAGNOSIS — K5730 Diverticulosis of large intestine without perforation or abscess without bleeding: Secondary | ICD-10-CM | POA: Diagnosis not present

## 2021-07-03 DIAGNOSIS — Z86010 Personal history of colonic polyps: Secondary | ICD-10-CM | POA: Diagnosis not present

## 2021-07-03 DIAGNOSIS — Z980 Intestinal bypass and anastomosis status: Secondary | ICD-10-CM | POA: Diagnosis not present

## 2021-07-03 DIAGNOSIS — Z85038 Personal history of other malignant neoplasm of large intestine: Secondary | ICD-10-CM | POA: Diagnosis not present

## 2021-07-05 DIAGNOSIS — D123 Benign neoplasm of transverse colon: Secondary | ICD-10-CM | POA: Diagnosis not present

## 2021-07-09 ENCOUNTER — Other Ambulatory Visit: Payer: Self-pay | Admitting: Cardiology

## 2021-07-09 DIAGNOSIS — I4891 Unspecified atrial fibrillation: Secondary | ICD-10-CM

## 2021-07-09 NOTE — Telephone Encounter (Signed)
Prescription refill request for Eliquis received. Indication: Afib  Last office visit:04/02/21 Gardiner Rhyme)  Scr: 0.96 (04/02/21)  Age: 81 Weight: 87.7kg  Appropriate dose and refill sent to requested pharmacy.

## 2021-07-10 ENCOUNTER — Telehealth: Payer: Self-pay | Admitting: Cardiology

## 2021-07-10 MED ORDER — AMLODIPINE BESYLATE 5 MG PO TABS: 5.0000 mg | ORAL_TABLET | Freq: Every day | ORAL | 3 refills | Status: DC

## 2021-07-10 NOTE — Telephone Encounter (Signed)
°*  STAT* If patient is at the pharmacy, call can be transferred to refill team.   1. Which medications need to be refilled? (please list name of each medication and dose if known)  amLODipine (NORVASC) 5 MG tablet   2. Which pharmacy/location (including street and city if local pharmacy) is medication to be sent to? [facility] [id number] - [place], [place]  3. Do they need a 30 day or 90 day supply? 90 with refills    Patient needs a new rx sent to [facility]

## 2021-07-12 ENCOUNTER — Other Ambulatory Visit: Payer: Self-pay

## 2021-07-12 MED ORDER — AMLODIPINE BESYLATE 5 MG PO TABS: 5.0000 mg | ORAL_TABLET | Freq: Every day | ORAL | 3 refills | Status: DC

## 2021-08-28 DIAGNOSIS — H353121 Nonexudative age-related macular degeneration, left eye, early dry stage: Secondary | ICD-10-CM | POA: Diagnosis not present

## 2021-08-28 DIAGNOSIS — H353211 Exudative age-related macular degeneration, right eye, with active choroidal neovascularization: Secondary | ICD-10-CM | POA: Diagnosis not present

## 2021-08-28 DIAGNOSIS — H43813 Vitreous degeneration, bilateral: Secondary | ICD-10-CM | POA: Diagnosis not present

## 2021-09-30 ENCOUNTER — Other Ambulatory Visit: Payer: Self-pay | Admitting: Cardiology

## 2021-10-25 DIAGNOSIS — H353122 Nonexudative age-related macular degeneration, left eye, intermediate dry stage: Secondary | ICD-10-CM | POA: Diagnosis not present

## 2021-10-25 DIAGNOSIS — H353211 Exudative age-related macular degeneration, right eye, with active choroidal neovascularization: Secondary | ICD-10-CM | POA: Diagnosis not present

## 2021-10-25 DIAGNOSIS — H43813 Vitreous degeneration, bilateral: Secondary | ICD-10-CM | POA: Diagnosis not present

## 2021-11-07 ENCOUNTER — Other Ambulatory Visit: Payer: Self-pay | Admitting: Cardiology

## 2021-11-07 ENCOUNTER — Other Ambulatory Visit: Payer: Self-pay | Admitting: Internal Medicine

## 2021-11-07 ENCOUNTER — Ambulatory Visit
Admission: RE | Admit: 2021-11-07 | Discharge: 2021-11-07 | Disposition: A | Discharge status: Home / Self Care | Payer: Medicare Other | Source: Ambulatory Visit | Attending: Internal Medicine | Admitting: Internal Medicine

## 2021-11-07 DIAGNOSIS — I4891 Unspecified atrial fibrillation: Secondary | ICD-10-CM

## 2021-11-07 DIAGNOSIS — R928 Other abnormal and inconclusive findings on diagnostic imaging of breast: Secondary | ICD-10-CM

## 2021-11-07 DIAGNOSIS — R921 Mammographic calcification found on diagnostic imaging of breast: Secondary | ICD-10-CM | POA: Diagnosis not present

## 2021-11-07 NOTE — Telephone Encounter (Signed)
Eliquis '5mg'$  refill request received. Patient is 81 years old, weight-87.7kg, Crea-0.96 on 04/02/2021, Diagnosis-Afib, and last seen by Dr. Gardiner Rhyme on 04/02/2021. Dose is appropriate based on dosing criteria. Will send in refill to requested pharmacy.

## 2021-11-11 ENCOUNTER — Ambulatory Visit
Admission: RE | Admit: 2021-11-11 | Discharge: 2021-11-11 | Disposition: A | Discharge status: Home / Self Care | Payer: Medicare Other | Source: Ambulatory Visit | Attending: Internal Medicine | Admitting: Internal Medicine

## 2021-11-11 DIAGNOSIS — N62 Hypertrophy of breast: Secondary | ICD-10-CM | POA: Diagnosis not present

## 2021-11-11 DIAGNOSIS — R921 Mammographic calcification found on diagnostic imaging of breast: Secondary | ICD-10-CM

## 2021-12-20 DIAGNOSIS — H353211 Exudative age-related macular degeneration, right eye, with active choroidal neovascularization: Secondary | ICD-10-CM | POA: Diagnosis not present

## 2021-12-20 DIAGNOSIS — H43813 Vitreous degeneration, bilateral: Secondary | ICD-10-CM | POA: Diagnosis not present

## 2021-12-20 DIAGNOSIS — H353122 Nonexudative age-related macular degeneration, left eye, intermediate dry stage: Secondary | ICD-10-CM | POA: Diagnosis not present

## 2022-01-21 DIAGNOSIS — Z23 Encounter for immunization: Secondary | ICD-10-CM | POA: Diagnosis not present

## 2022-01-31 DIAGNOSIS — H6691 Otitis media, unspecified, right ear: Secondary | ICD-10-CM | POA: Diagnosis not present

## 2022-02-05 ENCOUNTER — Encounter: Payer: Self-pay | Admitting: Podiatry

## 2022-02-05 ENCOUNTER — Ambulatory Visit (INDEPENDENT_AMBULATORY_CARE_PROVIDER_SITE_OTHER): Payer: Medicare Other | Admitting: Podiatry

## 2022-02-05 DIAGNOSIS — R600 Localized edema: Secondary | ICD-10-CM | POA: Diagnosis not present

## 2022-02-05 DIAGNOSIS — M779 Enthesopathy, unspecified: Secondary | ICD-10-CM

## 2022-02-05 DIAGNOSIS — G629 Polyneuropathy, unspecified: Secondary | ICD-10-CM

## 2022-02-06 NOTE — Progress Notes (Signed)
Subjective:   Patient ID: Carla Petersen, female   DOB: 81 y.o.   MRN: [id number]   HPI Patient presents concerned about tingling in both feet and also has some swelling which has been chronic into her lower limbs ankles and midfoot.  States that she is not losing any balance currently and it is only mildly progressed over the last 6 months.  Patient does not smoke likes to be active   Review of Systems  All other systems reviewed and are negative.       Objective:  Physical Exam Vitals and nursing note reviewed.  Constitutional:      Appearance: She is well-developed.  Pulmonary:     Effort: Pulmonary effort is normal.  Musculoskeletal:        General: Normal range of motion.  Skin:    General: Skin is warm.  Neurological:     Mental Status: She is alert.     Neurological mildly diminished but intact bilateral DP PT pulses with patient found to have diminishment of sharp dull vibratory at the current time and does have moderate edema +1 pitting in the midfoot and ankle region bilateral with mild lower extremity swelling negative Homans' sign.  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  Patient will be seen back if symptoms were to progress and may require neurologist consult with possible nerve conduction studies at 1 point in future

## 2022-02-07 DIAGNOSIS — H6691 Otitis media, unspecified, right ear: Secondary | ICD-10-CM | POA: Diagnosis not present

## 2022-02-13 DIAGNOSIS — I4891 Unspecified atrial fibrillation: Secondary | ICD-10-CM | POA: Diagnosis not present

## 2022-02-13 DIAGNOSIS — Z853 Personal history of malignant neoplasm of breast: Secondary | ICD-10-CM | POA: Diagnosis not present

## 2022-02-13 DIAGNOSIS — F419 Anxiety disorder, unspecified: Secondary | ICD-10-CM | POA: Diagnosis not present

## 2022-02-13 DIAGNOSIS — Z1331 Encounter for screening for depression: Secondary | ICD-10-CM | POA: Diagnosis not present

## 2022-02-13 DIAGNOSIS — E782 Mixed hyperlipidemia: Secondary | ICD-10-CM | POA: Diagnosis not present

## 2022-02-13 DIAGNOSIS — M810 Age-related osteoporosis without current pathological fracture: Secondary | ICD-10-CM | POA: Diagnosis not present

## 2022-02-13 DIAGNOSIS — I10 Essential (primary) hypertension: Secondary | ICD-10-CM | POA: Diagnosis not present

## 2022-02-13 DIAGNOSIS — R7303 Prediabetes: Secondary | ICD-10-CM | POA: Diagnosis not present

## 2022-02-13 DIAGNOSIS — N1831 Chronic kidney disease, stage 3a: Secondary | ICD-10-CM | POA: Diagnosis not present

## 2022-02-13 DIAGNOSIS — H6690 Otitis media, unspecified, unspecified ear: Secondary | ICD-10-CM | POA: Diagnosis not present

## 2022-02-13 DIAGNOSIS — E039 Hypothyroidism, unspecified: Secondary | ICD-10-CM | POA: Diagnosis not present

## 2022-02-13 DIAGNOSIS — G4733 Obstructive sleep apnea (adult) (pediatric): Secondary | ICD-10-CM | POA: Diagnosis not present

## 2022-02-13 DIAGNOSIS — D6869 Other thrombophilia: Secondary | ICD-10-CM | POA: Diagnosis not present

## 2022-02-13 DIAGNOSIS — Z0000 Encounter for general adult medical examination without abnormal findings: Secondary | ICD-10-CM | POA: Diagnosis not present

## 2022-02-14 DIAGNOSIS — H353122 Nonexudative age-related macular degeneration, left eye, intermediate dry stage: Secondary | ICD-10-CM | POA: Diagnosis not present

## 2022-02-14 DIAGNOSIS — H353211 Exudative age-related macular degeneration, right eye, with active choroidal neovascularization: Secondary | ICD-10-CM | POA: Diagnosis not present

## 2022-02-14 DIAGNOSIS — H43813 Vitreous degeneration, bilateral: Secondary | ICD-10-CM | POA: Diagnosis not present

## 2022-02-25 DIAGNOSIS — Z23 Encounter for immunization: Secondary | ICD-10-CM | POA: Diagnosis not present

## 2022-02-27 DIAGNOSIS — H43393 Other vitreous opacities, bilateral: Secondary | ICD-10-CM | POA: Diagnosis not present

## 2022-02-27 DIAGNOSIS — H353211 Exudative age-related macular degeneration, right eye, with active choroidal neovascularization: Secondary | ICD-10-CM | POA: Diagnosis not present

## 2022-02-27 DIAGNOSIS — H52221 Regular astigmatism, right eye: Secondary | ICD-10-CM | POA: Diagnosis not present

## 2022-02-27 DIAGNOSIS — Z961 Presence of intraocular lens: Secondary | ICD-10-CM | POA: Diagnosis not present

## 2022-02-27 DIAGNOSIS — H5213 Myopia, bilateral: Secondary | ICD-10-CM | POA: Diagnosis not present

## 2022-02-27 DIAGNOSIS — H524 Presbyopia: Secondary | ICD-10-CM | POA: Diagnosis not present

## 2022-02-27 DIAGNOSIS — H353121 Nonexudative age-related macular degeneration, left eye, early dry stage: Secondary | ICD-10-CM | POA: Diagnosis not present

## 2022-03-05 ENCOUNTER — Other Ambulatory Visit: Payer: Self-pay | Admitting: Internal Medicine

## 2022-03-05 DIAGNOSIS — R921 Mammographic calcification found on diagnostic imaging of breast: Secondary | ICD-10-CM

## 2022-03-16 NOTE — Progress Notes (Unsigned)
Cardiology Office Note:    Date:  03/17/2022   ID:  Carla Petersen, DOB [birth year], MRN [id number]  PCP:  Wenda Low, MD  Cardiologist:  Donato Heinz, MD  Electrophysiologist:  None   Referring MD: Wenda Low, MD   Chief Complaint  Patient presents with   Atrial Fibrillation     History of Present Illness:    Carla Petersen is a 81 y.o. female with a hx of Graves' disease, hyperlipidemia, prediabetes, breast cancer who presents for follow-up.  She was referred by Dr. Lysle Rubens for evaluation of palpitations, initially seen on 10/14/2019.  She reports that she began having palpitations around 09/26/2019.  Feels like her heart is racing during episodes.  Since episodes started she will have some days where she does not have any episodes but some days where they occur multiple times per day.  She did check her pulse during one episode and was 138.  Episode last anywhere from 10 seconds to 1 minute.  She has felt lightheaded during episodes but denies any syncope.  Also feels abdominal pain and feels like something is stuck in her throat at times.  She denies any shortness of breath or chest pain.  No regular exercise.  She quit smoking in 1995.  She does not drink coffee but drinks one 8 ounce Coke per day.  She does not drink alcohol.  Echocardiogram on 11/07/2019 showed normal biventricular function, no significant valvular disease.  Zio patch x7 days on 11/10/2019 showed 1% atrial fibrillation/atrial flutter burden, with average heart rate 152 bpm and longest episode lasting for 4 minutes.  She was started on Eliquis 5 mg twice daily and metoprolol 25 mg twice daily.  Since last clinic visit, she reports she is doing okay.  She is taking Eliquis, denies any bleeding issues.  Had colonoscopy and states was unremarkable.  Denies any chest pain, lightheadedness, syncope, or palpitations.  Does report some shortness of breath.  Also reports continues to have some swelling in her feet.   Main issue recently has been ear infection, for which she has upcoming appointment with ENT.    Past Medical History:  Diagnosis Date   Graves disease    Personal history of radiation therapy    Left Breast Cancer    Past Surgical History:  Procedure Laterality Date   BREAST EXCISIONAL BIOPSY Left    BREAST EXCISIONAL BIOPSY Left    BREAST LUMPECTOMY Left 1995   MASTECTOMY Left     Current Medications: Current Meds  Medication Sig   amLODipine (NORVASC) 5 MG tablet Take 1 tablet (5 mg total) by mouth daily.   apixaban (ELIQUIS) 5 MG TABS tablet TAKE ONE TABLET BY MOUTH TWICE A DAY   ascorbic acid (VITAMIN C) 500 MG tablet Take by mouth.   calcium carbonate (OS-CAL) 1250 (500 Ca) MG chewable tablet Chew by mouth.   ferrous sulfate 220 (44 FE) MG/5ML solution Take 65 mg by mouth daily.   levothyroxine (SYNTHROID, LEVOTHROID) 25 MCG tablet Take 25 mcg by mouth daily.   metoprolol tartrate (LOPRESSOR) 50 MG tablet TAKE ONE TABLET BY MOUTH TWICE A DAY   Multiple Vitamins-Minerals (PRESERVISION AREDS 2+MULTI VIT) CAPS    Nutritional Supplements (VITAMIN D MAINTENANCE PO) Take 800 Units by mouth daily.   Omega-3 Fatty Acids (FISH OIL CONCENTRATE) 300 MG CAPS 1 capsule   Vitamin D, Cholecalciferol, 25 MCG (1000 UT) CAPS 1 tablet     Allergies:   Latex, Penicillins, Sulfa antibiotics, and Pedi-pre tape spray [  wound dressing adhesive]   Social History   Socioeconomic History   Marital status: Married    Spouse name: Not on file   Number of children: Not on file   Years of education: Not on file   Highest education level: Not on file  Occupational History   Not on file  Tobacco Use   Smoking status: Former   Smokeless tobacco: Former    Quit date: 07/06/1993  Substance and Sexual Activity   Alcohol use: No   Drug use: No   Sexual activity: Not on file  Other Topics Concern   Not on file  Social History Narrative   Not on file   Social Determinants of Health    Financial Resource Strain: Not on file  Food Insecurity: Not on file  Transportation Needs: Not on file  Physical Activity: Not on file  Stress: Not on file  Social Connections: Not on file     Family History: The patient's family history is negative for Breast cancer.  ROS:   Please see the history of present illness. All other systems reviewed and are negative.  EKGs/Labs/Other Studies Reviewed:    The following studies were reviewed today:   EKG:   03/17/22: Normal sinus rhythm, rate 72, no ST abnormalities 04/02/21: Normal sinus rhythm, rate 74, no ST abnormalities 05/30/2020: normal sinus rhythm, rate 70, no ST abnormalities 09/17/2020:  normal sinus rhythm, rate 71 bpm, no ST abnormalities  Recent Labs: 04/02/2021: ALT 18; BUN 14; Creatinine, Ser 0.96; Hemoglobin 15.2; Platelets 220; Potassium 4.6; Sodium 142  Recent Lipid Panel No results found for: "CHOL", "TRIG", "HDL", "CHOLHDL", "VLDL", "LDLCALC", "LDLDIRECT"  Physical Exam:    VS:  BP 90/60   Pulse 72   Ht '5\' 3"'$  (1.6 m)   Wt 194 lb 3.2 oz (88.1 kg)   LMP 07/07/1983   SpO2 97%   BMI 34.40 kg/m     Wt Readings from Last 3 Encounters:  03/17/22 194 lb 3.2 oz (88.1 kg)  04/02/21 193 lb 6.4 oz (87.7 kg)  09/17/20 187 lb (84.8 kg)     GEN: in no acute distress HEENT: Normal NECK: No JVD; No carotid bruits CARDIAC: RRR, 2 out of 6 systolic heart murmur RESPIRATORY:  Clear to auscultation without rales, wheezing or rhonchi  ABDOMEN: Soft, non-tender, non-distended MUSCULOSKELETAL:  trace edema in left lower extremity SKIN: Warm and dry NEUROLOGIC:  Alert and oriented x 3 PSYCHIATRIC:  Normal affect   ASSESSMENT:    1. Paroxysmal atrial fibrillation (HCC)   2. Hyperlipidemia, unspecified hyperlipidemia type   3. Leg edema, left   4. Essential hypertension      PLAN:     Paroxysmal atrial fibrillation/atrial flutter: Zio patch x7 days on 11/10/2019 showed 1% atrial fibrillation/atrial flutter  burden, with average heart rate 152 bpm and longest episode lasting 4 minutes.  She was started on Eliquis 5 mg twice daily and metoprolol.  Echocardiogram on 11/07/2019 showed normal biventricular function, no significant valvular disease.  CHA2DS2-VASc score 3 (hypertension, age x2). -Continue Eliquis 5 mg twice daily. -Continue metoprolol 50 mg twice daily -Sleep study  showed borderline OSA overall but moderately severe REM related sleep apnea.  Patient electing to do oral appliance with Dr. Ron Parker.  Reports she has oral appliance but has not started using, encouraged to use   Left lower extremity edema: Asymmetric mild LLE edema.  Lower extremity duplex showed no DVT.  Normal BNP, albumin.  Suspect due to amlodipine use  Hypertension: On  amlodipine 5 mg daily and metoprolol 50 mg twice daily.  Initial BP check low in clinic today (90/60) but was 120/80 on repeat.  Asked to check BP twice daily for next week and call with results.  Hyperlipidemia: LDL 121 on 02/13/2022.  Recommend calcium score to guide how aggressive to be in lowering cholesterol  OSA: treating with oral appliance from Dr. Ron Parker as above, but she reports hs not been using.  Encouraged compliance   RTC in 6 months  Medication Adjustments/Labs and Tests Ordered: Current medicines are reviewed at length with the patient today.  Concerns regarding medicines are outlined above.  Orders Placed This Encounter  Procedures   CT CARDIAC SCORING (SELF PAY ONLY)   EKG 12-Lead    No orders of the defined types were placed in this encounter.    Patient Instructions  Medication Instructions:  Your physician recommends that you continue on your current medications as directed. Please refer to the Current Medication list given to you today.   *If you need a refill on your cardiac medications before your next appointment, please call your pharmacy*  Testing/Procedures: CT coronary calcium score.   Test locations:  [place]   This is $99 out of pocket.   Coronary CalciumScan A coronary calcium scan is an imaging test used to look for deposits of calcium and other fatty materials (plaques) in the inner lining of the blood vessels of the heart (coronary arteries). These deposits of calcium and plaques can partly clog and narrow the coronary arteries without producing any symptoms or warning signs. This puts a person at risk for a heart attack. This test can detect these deposits before symptoms develop. Tell a health care provider about: Any allergies you have. All medicines you are taking, including vitamins, herbs, eye drops, creams, and over-the-counter medicines. Any problems you or family members have had with anesthetic medicines. Any blood disorders you have. Any surgeries you have had. Any medical conditions you have. Whether you are pregnant or may be pregnant. What are the risks? Generally, this is a safe procedure. However, problems may occur, including: Harm to a pregnant woman and her unborn baby. This test involves the use of radiation. Radiation exposure can be dangerous to a pregnant woman and her unborn baby. If you are pregnant, you generally should not have this procedure done. Slight increase in the risk of cancer. This is because of the radiation involved in the test. What happens before the procedure? No preparation is needed for this procedure. What happens during the procedure? You will undress and remove any jewelry around your neck or chest. You will put on a hospital gown. Sticky electrodes will be placed on your chest. The electrodes will be connected to an electrocardiogram (ECG) machine to record a tracing of the electrical activity of your heart. A CT scanner will take pictures of your heart. During this time, you will be asked to lie still and hold your breath for 2-3 seconds while a picture of your heart is being taken. The procedure may vary  among health care providers and hospitals. What happens after the procedure? You can get dressed. You can return to your normal activities. It is up to you to get the results of your test. Ask your health care provider, or the department that is doing the test, when your results will be ready. Summary A coronary calcium scan is an imaging test used to look for deposits of calcium and other  fatty materials (plaques) in the inner lining of the blood vessels of the heart (coronary arteries). Generally, this is a safe procedure. Tell your health care provider if you are pregnant or may be pregnant. No preparation is needed for this procedure. A CT scanner will take pictures of your heart. You can return to your normal activities after the scan is done. This information is not intended to replace advice given to you by your health care provider. Make sure you discuss any questions you have with your health care provider. Document Released: 11/01/2007 Document Revised: 03/24/2016 Document Reviewed: 03/24/2016 Elsevier Interactive Patient Education  2017 [place]: At [facility], you and your health needs are our priority.  As part of our continuing mission to provide you with exceptional heart care, we have created designated Provider Care Teams.  These Care Teams include your primary Cardiologist (physician) and Advanced Practice Providers (APPs -  Physician Assistants and Nurse Practitioners) who all work together to provide you with the care you need, when you need it.  We recommend signing up for the patient portal called "MyChart".  Sign up information is provided on this After Visit Summary.  MyChart is used to connect with patients for Virtual Visits (Telemedicine).  Patients are able to view lab/test results, encounter notes, upcoming appointments, etc.  Non-urgent messages can be sent to your provider as well.   To learn more about what you can do with MyChart, go to  [email or web address].    Your next appointment:   6 month(s)  The format for your next appointment:   In Person  Provider:   Donato Heinz, MD     Other Instructions Please check your blood pressure at home twice daily, write it down.  Call the office or send message via Mychart with the readings in 1 week for Dr. Gardiner Rhyme to review.             Signed, Donato Heinz, MD  03/17/2022 5:39 PM    [place]

## 2022-03-17 ENCOUNTER — Encounter: Payer: Self-pay | Admitting: Cardiology

## 2022-03-17 ENCOUNTER — Ambulatory Visit: Payer: Medicare Other | Attending: Cardiology | Admitting: Cardiology

## 2022-03-17 VITALS — BP 90/60 | HR 72 | Ht 63.0 in | Wt 194.2 lb

## 2022-03-17 DIAGNOSIS — I10 Essential (primary) hypertension: Secondary | ICD-10-CM | POA: Insufficient documentation

## 2022-03-17 DIAGNOSIS — R600 Localized edema: Secondary | ICD-10-CM | POA: Diagnosis not present

## 2022-03-17 DIAGNOSIS — I480 Paroxysmal atrial fibrillation: Secondary | ICD-10-CM | POA: Diagnosis not present

## 2022-03-17 DIAGNOSIS — E785 Hyperlipidemia, unspecified: Secondary | ICD-10-CM | POA: Insufficient documentation

## 2022-03-17 NOTE — Patient Instructions (Signed)
Medication Instructions:  Your physician recommends that you continue on your current medications as directed. Please refer to the Current Medication list given to you today.   *If you need a refill on your cardiac medications before your next appointment, please call your pharmacy*  Testing/Procedures: CT coronary calcium score.   Test locations:  [place]   This is $99 out of pocket.   Coronary CalciumScan A coronary calcium scan is an imaging test used to look for deposits of calcium and other fatty materials (plaques) in the inner lining of the blood vessels of the heart (coronary arteries). These deposits of calcium and plaques can partly clog and narrow the coronary arteries without producing any symptoms or warning signs. This puts a person at risk for a heart attack. This test can detect these deposits before symptoms develop. Tell a health care provider about: Any allergies you have. All medicines you are taking, including vitamins, herbs, eye drops, creams, and over-the-counter medicines. Any problems you or family members have had with anesthetic medicines. Any blood disorders you have. Any surgeries you have had. Any medical conditions you have. Whether you are pregnant or may be pregnant. What are the risks? Generally, this is a safe procedure. However, problems may occur, including: Harm to a pregnant woman and her unborn baby. This test involves the use of radiation. Radiation exposure can be dangerous to a pregnant woman and her unborn baby. If you are pregnant, you generally should not have this procedure done. Slight increase in the risk of cancer. This is because of the radiation involved in the test. What happens before the procedure? No preparation is needed for this procedure. What happens during the procedure? You will undress and remove any jewelry around your neck or chest. You will put on a hospital gown. Sticky electrodes  will be placed on your chest. The electrodes will be connected to an electrocardiogram (ECG) machine to record a tracing of the electrical activity of your heart. A CT scanner will take pictures of your heart. During this time, you will be asked to lie still and hold your breath for 2-3 seconds while a picture of your heart is being taken. The procedure may vary among health care providers and hospitals. What happens after the procedure? You can get dressed. You can return to your normal activities. It is up to you to get the results of your test. Ask your health care provider, or the department that is doing the test, when your results will be ready. Summary A coronary calcium scan is an imaging test used to look for deposits of calcium and other fatty materials (plaques) in the inner lining of the blood vessels of the heart (coronary arteries). Generally, this is a safe procedure. Tell your health care provider if you are pregnant or may be pregnant. No preparation is needed for this procedure. A CT scanner will take pictures of your heart. You can return to your normal activities after the scan is done. This information is not intended to replace advice given to you by your health care provider. Make sure you discuss any questions you have with your health care provider. Document Released: 11/01/2007 Document Revised: 03/24/2016 Document Reviewed: 03/24/2016 Elsevier Interactive Patient Education  2017 [place]: At [facility], you and your health needs are our priority.  As part of our continuing mission to provide you with exceptional heart care, we have created designated Provider Care Teams.  These Care  Teams include your primary Cardiologist (physician) and Advanced Practice Providers (APPs -  Physician Assistants and Nurse Practitioners) who all work together to provide you with the care you need, when you need it.  We recommend signing up for the patient  portal called "MyChart".  Sign up information is provided on this After Visit Summary.  MyChart is used to connect with patients for Virtual Visits (Telemedicine).  Patients are able to view lab/test results, encounter notes, upcoming appointments, etc.  Non-urgent messages can be sent to your provider as well.   To learn more about what you can do with MyChart, go to [email or web address].    Your next appointment:   6 month(s)  The format for your next appointment:   In Person  Provider:   Donato Heinz, MD     Other Instructions Please check your blood pressure at home twice daily, write it down.  Call the office or send message via Mychart with the readings in 1 week for Dr. Gardiner Rhyme to review.

## 2022-03-26 ENCOUNTER — Encounter: Payer: Self-pay | Admitting: Cardiology

## 2022-03-27 DIAGNOSIS — H7193 Unspecified cholesteatoma, bilateral: Secondary | ICD-10-CM | POA: Diagnosis not present

## 2022-03-27 DIAGNOSIS — Z7901 Long term (current) use of anticoagulants: Secondary | ICD-10-CM | POA: Diagnosis not present

## 2022-03-27 DIAGNOSIS — H9211 Otorrhea, right ear: Secondary | ICD-10-CM | POA: Diagnosis not present

## 2022-03-27 DIAGNOSIS — Z9089 Acquired absence of other organs: Secondary | ICD-10-CM | POA: Diagnosis not present

## 2022-04-03 DIAGNOSIS — L729 Follicular cyst of the skin and subcutaneous tissue, unspecified: Secondary | ICD-10-CM | POA: Diagnosis not present

## 2022-04-03 DIAGNOSIS — H9501 Recurrent cholesteatoma of postmastoidectomy cavity, right ear: Secondary | ICD-10-CM | POA: Diagnosis not present

## 2022-04-03 DIAGNOSIS — L928 Other granulomatous disorders of the skin and subcutaneous tissue: Secondary | ICD-10-CM | POA: Diagnosis not present

## 2022-04-18 DIAGNOSIS — H353211 Exudative age-related macular degeneration, right eye, with active choroidal neovascularization: Secondary | ICD-10-CM | POA: Diagnosis not present

## 2022-04-18 DIAGNOSIS — H43813 Vitreous degeneration, bilateral: Secondary | ICD-10-CM | POA: Diagnosis not present

## 2022-04-18 DIAGNOSIS — H353122 Nonexudative age-related macular degeneration, left eye, intermediate dry stage: Secondary | ICD-10-CM | POA: Diagnosis not present

## 2022-04-22 ENCOUNTER — Ambulatory Visit
Admission: RE | Admit: 2022-04-22 | Discharge: 2022-04-22 | Disposition: A | Discharge status: Home / Self Care | Payer: Medicare Other | Source: Ambulatory Visit | Attending: Internal Medicine | Admitting: Internal Medicine

## 2022-04-22 DIAGNOSIS — R921 Mammographic calcification found on diagnostic imaging of breast: Secondary | ICD-10-CM

## 2022-04-23 ENCOUNTER — Ambulatory Visit (HOSPITAL_COMMUNITY)
Admission: RE | Admit: 2022-04-23 | Discharge: 2022-04-23 | Disposition: A | Discharge status: Home / Self Care | Payer: Medicare Other | Source: Ambulatory Visit | Attending: Cardiology | Admitting: Cardiology

## 2022-04-23 DIAGNOSIS — E785 Hyperlipidemia, unspecified: Secondary | ICD-10-CM | POA: Insufficient documentation

## 2022-04-25 ENCOUNTER — Other Ambulatory Visit: Payer: Self-pay | Admitting: *Deleted

## 2022-04-25 MED ORDER — ATORVASTATIN CALCIUM 20 MG PO TABS: 20.0000 mg | ORAL_TABLET | Freq: Every day | ORAL | 3 refills | Status: DC

## 2022-04-29 ENCOUNTER — Encounter: Payer: Self-pay | Admitting: Cardiology

## 2022-04-29 MED ORDER — ATORVASTATIN CALCIUM 20 MG PO TABS: 20.0000 mg | ORAL_TABLET | Freq: Every day | ORAL | 3 refills | Status: DC

## 2022-05-10 ENCOUNTER — Other Ambulatory Visit: Payer: Self-pay | Admitting: Cardiology

## 2022-05-10 DIAGNOSIS — I4891 Unspecified atrial fibrillation: Secondary | ICD-10-CM

## 2022-06-05 DIAGNOSIS — D485 Neoplasm of uncertain behavior of skin: Secondary | ICD-10-CM | POA: Diagnosis not present

## 2022-06-05 DIAGNOSIS — L989 Disorder of the skin and subcutaneous tissue, unspecified: Secondary | ICD-10-CM | POA: Diagnosis not present

## 2022-06-24 ENCOUNTER — Other Ambulatory Visit: Payer: Self-pay | Admitting: Cardiology

## 2022-07-01 DIAGNOSIS — H353211 Exudative age-related macular degeneration, right eye, with active choroidal neovascularization: Secondary | ICD-10-CM | POA: Diagnosis not present

## 2022-07-01 DIAGNOSIS — H353122 Nonexudative age-related macular degeneration, left eye, intermediate dry stage: Secondary | ICD-10-CM | POA: Diagnosis not present

## 2022-07-01 DIAGNOSIS — H43813 Vitreous degeneration, bilateral: Secondary | ICD-10-CM | POA: Diagnosis not present

## 2022-07-03 ENCOUNTER — Other Ambulatory Visit: Payer: Self-pay | Admitting: Cardiology

## 2022-07-22 ENCOUNTER — Ambulatory Visit
Admission: RE | Admit: 2022-07-22 | Discharge: 2022-07-22 | Disposition: A | Discharge status: Home / Self Care | Payer: Medicare Other | Source: Ambulatory Visit | Attending: Internal Medicine | Admitting: Internal Medicine

## 2022-07-22 ENCOUNTER — Other Ambulatory Visit: Payer: Self-pay | Admitting: Internal Medicine

## 2022-07-22 DIAGNOSIS — R0602 Shortness of breath: Secondary | ICD-10-CM | POA: Diagnosis not present

## 2022-07-22 DIAGNOSIS — R059 Cough, unspecified: Secondary | ICD-10-CM

## 2022-08-14 DIAGNOSIS — N1831 Chronic kidney disease, stage 3a: Secondary | ICD-10-CM | POA: Diagnosis not present

## 2022-08-14 DIAGNOSIS — E782 Mixed hyperlipidemia: Secondary | ICD-10-CM | POA: Diagnosis not present

## 2022-08-14 DIAGNOSIS — I10 Essential (primary) hypertension: Secondary | ICD-10-CM | POA: Diagnosis not present

## 2022-08-14 DIAGNOSIS — Z853 Personal history of malignant neoplasm of breast: Secondary | ICD-10-CM | POA: Diagnosis not present

## 2022-08-14 DIAGNOSIS — Z85038 Personal history of other malignant neoplasm of large intestine: Secondary | ICD-10-CM | POA: Diagnosis not present

## 2022-08-14 DIAGNOSIS — I700 Atherosclerosis of aorta: Secondary | ICD-10-CM | POA: Diagnosis not present

## 2022-08-14 DIAGNOSIS — G4733 Obstructive sleep apnea (adult) (pediatric): Secondary | ICD-10-CM | POA: Diagnosis not present

## 2022-08-14 DIAGNOSIS — D6869 Other thrombophilia: Secondary | ICD-10-CM | POA: Diagnosis not present

## 2022-08-14 DIAGNOSIS — E039 Hypothyroidism, unspecified: Secondary | ICD-10-CM | POA: Diagnosis not present

## 2022-08-14 DIAGNOSIS — I4891 Unspecified atrial fibrillation: Secondary | ICD-10-CM | POA: Diagnosis not present

## 2022-08-26 DIAGNOSIS — H353211 Exudative age-related macular degeneration, right eye, with active choroidal neovascularization: Secondary | ICD-10-CM | POA: Diagnosis not present

## 2022-08-26 DIAGNOSIS — H43813 Vitreous degeneration, bilateral: Secondary | ICD-10-CM | POA: Diagnosis not present

## 2022-08-26 DIAGNOSIS — H353122 Nonexudative age-related macular degeneration, left eye, intermediate dry stage: Secondary | ICD-10-CM | POA: Diagnosis not present

## 2022-08-31 NOTE — Progress Notes (Signed)
Cardiology Office Note:    Date:  09/02/2022   ID:  Carla Petersen, DOB 1941/01/02, MRN [id number]  PCP:  Georgann Housekeeper, MD  Cardiologist:  Little Ishikawa, MD  Electrophysiologist:  None   Referring MD: Georgann Housekeeper, MD   No chief complaint on file.    History of Present Illness:    Carla Petersen is a 82 y.o. female with a hx of Graves' disease, hyperlipidemia, prediabetes, breast cancer who presents for follow-up.  She was referred by Dr. Donette Larry for evaluation of palpitations, initially seen on 10/14/2019.  She reports that she began having palpitations around 09/26/2019.  Feels like her heart is racing during episodes.  Since episodes started she will have some days where she does not have any episodes but some days where they occur multiple times per day.  She did check her pulse during one episode and was 138.  Episode last anywhere from 10 seconds to 1 minute.  She has felt lightheaded during episodes but denies any syncope.  Also feels abdominal pain and feels like something is stuck in her throat at times.  She denies any shortness of breath or chest pain.  No regular exercise.  She quit smoking in 1995.  She does not drink coffee but drinks one 8 ounce Coke per day.  She does not drink alcohol.  Echocardiogram on 11/07/2019 showed normal biventricular function, no significant valvular disease.  Zio patch x7 days on 11/10/2019 showed 1% atrial fibrillation/atrial flutter burden, with average heart rate 152 bpm and longest episode lasting for 4 minutes.  She was started on Eliquis 5 mg twice daily and metoprolol 25 mg twice daily.  Calcium score 179 (54th percentile) in 04/2022.  Since last clinic visit, she reports she is doing okay.  Had issues with cough recently.  Completed course of antibiotics. Denies any chest pain, dyspnea, lightheadedness, syncope,  or palpitations.  Does report some lower extremity edema.  She is taking Eliquis.  Does have history of hemorrhoids and had  some blood on toilet paper but none in toilet bowl.    Past Medical History:  Diagnosis Date   Graves disease    Personal history of radiation therapy    Left Breast Cancer    Past Surgical History:  Procedure Laterality Date   BREAST EXCISIONAL BIOPSY Left    BREAST EXCISIONAL BIOPSY Left    BREAST LUMPECTOMY Left 1995   MASTECTOMY Left     Current Medications: Current Meds  Medication Sig   acetaminophen (TYLENOL) 500 MG tablet Take 500 mg by mouth. PRN   amLODipine (NORVASC) 5 MG tablet Take 1 tablet (5 mg total) by mouth daily. Please keep scheduled appointment for future refills. Thank you.   apixaban (ELIQUIS) 5 MG TABS tablet TAKE 1 TABLET BY MOUTH TWICE A DAY   ascorbic acid (VITAMIN C) 500 MG tablet Take by mouth.   atorvastatin (LIPITOR) 20 MG tablet Take 1 tablet (20 mg total) by mouth daily.   calcium carbonate (OS-CAL) 1250 (500 Ca) MG chewable tablet Chew by mouth.   ferrous sulfate 220 (44 FE) MG/5ML solution Take 65 mg by mouth daily.   levothyroxine (SYNTHROID, LEVOTHROID) 25 MCG tablet Take 25 mcg by mouth daily.   metoprolol tartrate (LOPRESSOR) 50 MG tablet TAKE ONE TABLET BY MOUTH TWICE A DAY   Multiple Vitamins-Minerals (PRESERVISION AREDS 2+MULTI VIT) CAPS    Nutritional Supplements (VITAMIN D MAINTENANCE PO) Take 800 Units by mouth daily.   Omega-3 Fatty Acids (FISH OIL CONCENTRATE)  300 MG CAPS 1 capsule   Vitamin D, Cholecalciferol, 25 MCG (1000 UT) CAPS 1 tablet     Allergies:   Latex, Penicillins, Sulfa antibiotics, and Pedi-pre tape spray [wound dressing adhesive]   Social History   Socioeconomic History   Marital status: Married    Spouse name: Not on file   Number of children: Not on file   Years of education: Not on file   Highest education level: Not on file  Occupational History   Not on file  Tobacco Use   Smoking status: Former   Smokeless tobacco: Former    Quit date: 07/06/1993  Substance and Sexual Activity   Alcohol use: No    Drug use: No   Sexual activity: Not on file  Other Topics Concern   Not on file  Social History Narrative   Not on file   Social Determinants of Health   Financial Resource Strain: Not on file  Food Insecurity: Not on file  Transportation Needs: Not on file  Physical Activity: Not on file  Stress: Not on file  Social Connections: Not on file     Family History: The patient's family history is negative for Breast cancer.  ROS:   Please see the history of present illness. All other systems reviewed and are negative.  EKGs/Labs/Other Studies Reviewed:    The following studies were reviewed today:   EKG:   09/02/22: Normal sinus rhythm, rate 70, no ST abnormality 03/17/22: Normal sinus rhythm, rate 72, no ST abnormalities 04/02/21: Normal sinus rhythm, rate 74, no ST abnormalities 05/30/2020: normal sinus rhythm, rate 70, no ST abnormalities 09/17/2020:  normal sinus rhythm, rate 71 bpm, no ST abnormalities  Recent Labs: No results found for requested labs within last 365 days.  Recent Lipid Panel No results found for: "CHOL", "TRIG", "HDL", "CHOLHDL", "VLDL", "LDLCALC", "LDLDIRECT"  Physical Exam:    VS:  BP 124/72 (BP Location: Left Arm, Patient Position: Sitting, Cuff Size: Large)   Pulse 70   Ht 5\' 3"  (1.6 m)   Wt 188 lb 6.4 oz (85.5 kg)   LMP 07/07/1983   SpO2 90%   BMI 33.37 kg/m     Wt Readings from Last 3 Encounters:  09/02/22 188 lb 6.4 oz (85.5 kg)  03/17/22 194 lb 3.2 oz (88.1 kg)  04/02/21 193 lb 6.4 oz (87.7 kg)     GEN: in no acute distress HEENT: Normal NECK: No JVD; No carotid bruits CARDIAC: RRR, 2 out of 6 systolic heart murmur RESPIRATORY:  Clear to auscultation without rales, wheezing or rhonchi  ABDOMEN: Soft, non-tender, non-distended MUSCULOSKELETAL:  trace edema in lower extremity SKIN: Warm and dry NEUROLOGIC:  Alert and oriented x 3 PSYCHIATRIC:  Normal affect   ASSESSMENT:    1. Paroxysmal atrial fibrillation   2. Heart  murmur   3. Essential hypertension   4. Hyperlipidemia, unspecified hyperlipidemia type      PLAN:    Paroxysmal atrial fibrillation/atrial flutter: Zio patch x7 days on 11/10/2019 showed 1% atrial fibrillation/atrial flutter burden, with average heart rate 152 bpm and longest episode lasting 4 minutes.  She was started on Eliquis 5 mg twice daily and metoprolol.  Echocardiogram on 11/07/2019 showed normal biventricular function, no significant valvular disease.  CHA2DS2-VASc score 3 (hypertension, age x2). -Continue Eliquis 5 mg twice daily.  Check CBC -Continue metoprolol 50 mg twice daily -Sleep study  showed borderline OSA overall but moderately severe REM related sleep apnea.  Patient electing to do oral appliance with  Dr. Myrtis Ser.  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  Worsening murmur on exam today, check echo to evaluate for progression to aortic stenosis  Lower extremity edema: Mild LE edema.  Lower extremity duplex showed no DVT.  Normal BNP, albumin.  Suspect due to amlodipine use  Hypertension: On amlodipine 5 mg daily and metoprolol 50 mg twice daily.  Appears controlled  Hyperlipidemia: LDL 121 on 02/13/2022.  Calcium score 179 (54th percentile) in 04/2022.  Started on aotrvastatin 20 mg daily.  Check lipid panel  OSA: treating with oral appliance from Dr. Myrtis Ser as above, but she reports hs not been using.  Encouraged compliance   RTC in 6 months  Medication Adjustments/Labs and Tests Ordered: Current medicines are reviewed at length with the patient today.  Concerns regarding medicines are outlined above.  Orders Placed This Encounter  Procedures   Lipid panel   CBC   Basic metabolic panel   EKG 12-Lead   ECHOCARDIOGRAM COMPLETE    No orders of the defined types were placed in this encounter.    Patient Instructions  Medication Instructions:  Your physician recommends that you  continue on your current medications as directed. Please refer to the Current Medication list given to you today.  *If you need a refill on your cardiac medications before your next appointment, please call your pharmacy*  Labs: BMET, CBC, Lipid today  Testing/Procedures: Your physician has requested that you have an echocardiogram. Echocardiography is a painless test that uses sound waves to create images of your heart. It provides your doctor with information about the size and shape of your heart and how well your heart's chambers and valves are working. This procedure takes approximately one hour. There are no restrictions for this procedure. Please do NOT wear cologne, perfume, aftershave, or lotions (deodorant is allowed). Please arrive 15 minutes prior to your appointment time.  Follow-Up: At [facility], you and your health needs are our priority.  As part of our continuing mission to provide you with exceptional heart care, we have created designated Provider Care Teams.  These Care Teams include your primary Cardiologist (physician) and Advanced Practice Providers (APPs -  Physician Assistants and Nurse Practitioners) who all work together to provide you with the care you need, when you need it.  We recommend signing up for the patient portal called "MyChart".  Sign up information is provided on this After Visit Summary.  MyChart is used to connect with patients for Virtual Visits (Telemedicine).  Patients are able to view lab/test results, encounter notes, upcoming appointments, etc.  Non-urgent messages can be sent to your provider as well.   To learn more about what you can do with MyChart, go to [email or web address].    Your next appointment:   6 month(s)  Provider:   Little Ishikawa, MD           Signed, Little Ishikawa, MD  09/02/2022 5:46 PM    [facility]

## 2022-09-01 ENCOUNTER — Ambulatory Visit: Payer: Medicare Other | Admitting: Cardiology

## 2022-09-02 ENCOUNTER — Encounter: Payer: Self-pay | Admitting: Cardiology

## 2022-09-02 ENCOUNTER — Ambulatory Visit: Payer: Medicare Other | Attending: Cardiology | Admitting: Cardiology

## 2022-09-02 VITALS — BP 124/72 | HR 70 | Ht 63.0 in | Wt 188.4 lb

## 2022-09-02 DIAGNOSIS — I10 Essential (primary) hypertension: Secondary | ICD-10-CM | POA: Diagnosis not present

## 2022-09-02 DIAGNOSIS — E785 Hyperlipidemia, unspecified: Secondary | ICD-10-CM | POA: Diagnosis not present

## 2022-09-02 DIAGNOSIS — I480 Paroxysmal atrial fibrillation: Secondary | ICD-10-CM | POA: Diagnosis not present

## 2022-09-02 DIAGNOSIS — R011 Cardiac murmur, unspecified: Secondary | ICD-10-CM

## 2022-09-02 NOTE — Patient Instructions (Addendum)
Medication Instructions:  Your physician recommends that you continue on your current medications as directed. Please refer to the Current Medication list given to you today.  *If you need a refill on your cardiac medications before your next appointment, please call your pharmacy*  Labs: BMET, CBC, Lipid today  Testing/Procedures: Your physician has requested that you have an echocardiogram. Echocardiography is a painless test that uses sound waves to create images of your heart. It provides your doctor with information about the size and shape of your heart and how well your heart's chambers and valves are working. This procedure takes approximately one hour. There are no restrictions for this procedure. Please do NOT wear cologne, perfume, aftershave, or lotions (deodorant is allowed). Please arrive 15 minutes prior to your appointment time.  Follow-Up: At [facility], you and your health needs are our priority.  As part of our continuing mission to provide you with exceptional heart care, we have created designated Provider Care Teams.  These Care Teams include your primary Cardiologist (physician) and Advanced Practice Providers (APPs -  Physician Assistants and Nurse Practitioners) who all work together to provide you with the care you need, when you need it.  We recommend signing up for the patient portal called "MyChart".  Sign up information is provided on this After Visit Summary.  MyChart is used to connect with patients for Virtual Visits (Telemedicine).  Patients are able to view lab/test results, encounter notes, upcoming appointments, etc.  Non-urgent messages can be sent to your provider as well.   To learn more about what you can do with MyChart, go to [email or web address].    Your next appointment:   6 month(s)  Provider:   Little Ishikawa, MD

## 2022-09-03 LAB — CBC
Hematocrit: 45.7 % (ref 34.0–46.6)
Hemoglobin: 15.4 g/dL (ref 11.1–15.9)
MCH: 32 pg (ref 26.6–33.0)
MCHC: 33.7 g/dL (ref 31.5–35.7)
MCV: 95 fL (ref 79–97)
Platelets: 242 10*3/uL (ref 150–450)
RBC: 4.82 x10E6/uL (ref 3.77–5.28)
RDW: 12.6 % (ref 11.7–15.4)
WBC: 10.1 10*3/uL (ref 3.4–10.8)

## 2022-09-03 LAB — BASIC METABOLIC PANEL
BUN/Creatinine Ratio: 17 (ref 12–28)
BUN: 16 mg/dL (ref 8–27)
CO2: 24 mmol/L (ref 20–29)
Calcium: 9.5 mg/dL (ref 8.7–10.3)
Chloride: 100 mmol/L (ref 96–106)
Creatinine, Ser: 0.95 mg/dL (ref 0.57–1.00)
Glucose: 86 mg/dL (ref 70–99)
Potassium: 4.6 mmol/L (ref 3.5–5.2)
Sodium: 141 mmol/L (ref 134–144)
eGFR: 59 mL/min/{1.73_m2} — ABNORMAL LOW (ref >59)

## 2022-09-03 LAB — LIPID PANEL
Chol/HDL Ratio: 2.6 ratio (ref 0.0–4.4)
Cholesterol, Total: 115 mg/dL (ref 100–199)
HDL: 44 mg/dL (ref >39)
LDL Chol Calc (NIH): 46 mg/dL (ref 0–99)
Triglycerides: 145 mg/dL (ref 0–149)
VLDL Cholesterol Cal: 25 mg/dL (ref 5–40)

## 2022-09-19 ENCOUNTER — Telehealth: Payer: Self-pay

## 2022-09-19 NOTE — Telephone Encounter (Signed)
   Pre-operative Risk Assessment    Patient Name: Carla Petersen  DOB: March 01, 1941 MRN: [id number]     Request for Surgical Clearance    Procedure:  Dental Extraction - Amount of Teeth to be Pulled:  2-3  Date of Surgery:  Clearance TBD                                 Surgeon:   Surgeon's Group or Practice Name:  [facility] Phone number:  [phone] Fax number:  [phone]   Type of Clearance Requested:   - Medical  - Pharmacy:  Hold Apixaban (Eliquis) Need instructions    Type of Anesthesia:   IV Sedation    Additional requests/questions:  Does this patient need antibiotics? Are There any recommendations and/or contraindications to surgery with local anesthesia versus conscious sedation (IV midazolam and Fentanyl)? Is patient cleared for intravenous anesthesia in the office setting or would the procedure need to be performed inpatient with cardiac anesthesia?  Signed, Dorris Fetch   09/19/2022, 4:31 PM

## 2022-09-22 NOTE — Telephone Encounter (Signed)
Dr. Bjorn Pippin,  You saw this patient on 09/02/2022.  She had worsening murmur and you ordered echo which is scheduled for 10/02/2022.  Will you please comment on medical clearance for dental extraction of 2-3 teeth with IV sedation?  Pharmacy is recommended holding Eliquis 1 day prior.  Will patient need  echo prior to undergoing dental extractions?  Please route your response to P CV DIV Preop. I will communicate with requesting office once you have given recommendations.   Thank you!  Carlos Levering, NP

## 2022-09-22 NOTE — Telephone Encounter (Signed)
Patient with diagnosis of afib on Eliquis for anticoagulation.    Procedure: 2-3 dental extractions Date of procedure: TBD  CHA2DS2-VASc Score = 5  This indicates a 7.2% annual risk of stroke. The patient's score is based upon: CHF History: 0 HTN History: 1 Diabetes History: 0 Stroke History: 0 Vascular Disease History: 1 Age Score: 2 Gender Score: 1  CrCl 29mL/min using adj body weight Platelet count 242K  Patient does not require pre-op antibiotics for dental procedure.  Per office protocol, patient can hold Eliquis for 1 day prior to procedure.    **This guidance is not considered finalized until pre-operative APP has relayed final recommendations.**

## 2022-09-23 DIAGNOSIS — Z853 Personal history of malignant neoplasm of breast: Secondary | ICD-10-CM | POA: Diagnosis not present

## 2022-09-23 DIAGNOSIS — D6869 Other thrombophilia: Secondary | ICD-10-CM | POA: Diagnosis not present

## 2022-09-23 DIAGNOSIS — R7303 Prediabetes: Secondary | ICD-10-CM | POA: Diagnosis not present

## 2022-09-23 DIAGNOSIS — G4733 Obstructive sleep apnea (adult) (pediatric): Secondary | ICD-10-CM | POA: Diagnosis not present

## 2022-09-23 DIAGNOSIS — I10 Essential (primary) hypertension: Secondary | ICD-10-CM | POA: Diagnosis not present

## 2022-09-23 DIAGNOSIS — F419 Anxiety disorder, unspecified: Secondary | ICD-10-CM | POA: Diagnosis not present

## 2022-09-23 DIAGNOSIS — E039 Hypothyroidism, unspecified: Secondary | ICD-10-CM | POA: Diagnosis not present

## 2022-09-23 DIAGNOSIS — N1831 Chronic kidney disease, stage 3a: Secondary | ICD-10-CM | POA: Diagnosis not present

## 2022-09-23 DIAGNOSIS — I4891 Unspecified atrial fibrillation: Secondary | ICD-10-CM | POA: Diagnosis not present

## 2022-09-23 DIAGNOSIS — E782 Mixed hyperlipidemia: Secondary | ICD-10-CM | POA: Diagnosis not present

## 2022-09-23 DIAGNOSIS — I700 Atherosclerosis of aorta: Secondary | ICD-10-CM | POA: Diagnosis not present

## 2022-09-23 NOTE — Telephone Encounter (Signed)
Since echo scheduled for next week and planning IV sedation, would favor obtaining echo prior to extractions

## 2022-09-26 NOTE — Telephone Encounter (Signed)
Pt scheduled for Echocardiogram 10/02/22, clearance will be addressed at that time.  Will send back to the requesting surgeon's office to make them aware.

## 2022-09-29 ENCOUNTER — Other Ambulatory Visit: Payer: Self-pay | Admitting: Cardiology

## 2022-10-02 ENCOUNTER — Ambulatory Visit (HOSPITAL_COMMUNITY): Payer: Medicare Other | Attending: Cardiology

## 2022-10-02 DIAGNOSIS — R011 Cardiac murmur, unspecified: Secondary | ICD-10-CM | POA: Diagnosis not present

## 2022-10-02 LAB — ECHOCARDIOGRAM COMPLETE
Area-P 1/2: 3.12 cm2
S' Lateral: 2.5 cm

## 2022-10-02 NOTE — Telephone Encounter (Signed)
[facility] - Oral Surgery calling for an update on Echo

## 2022-10-02 NOTE — Telephone Encounter (Signed)
I will forward this to pre op pool for review if pt has been cleared.

## 2022-10-03 NOTE — Telephone Encounter (Signed)
   Primary Cardiologist: Little Ishikawa, MD  Chart reviewed as part of pre-operative protocol coverage. Given past medical history and time since last visit, based on ACC/AHA guidelines, Carla Petersen would be at acceptable risk for the planned procedure without further cardiovascular testing.   Patient was advised that if she develops new symptoms prior to surgery to contact our office to arrange a follow-up appointment. She verbalized understanding.  Per office protocol, patient can hold Eliquis for 1 day prior to procedure.    I will route this recommendation to the requesting party via Epic fax function and remove from pre-op pool.  Please call with questions.  Levi Aland, NP-C  10/03/2022, 8:10 AM [address], [address] Office [phone] Fax [phone]

## 2022-10-14 IMAGING — MG MM DIGITAL DIAGNOSTIC UNILAT*R*
3 series · 3 of 3 positions shown · non-contrast
Comparison: Previous exam(s).

CLINICAL DATA: Screening recall for right breast calcifications.

EXAM:
DIGITAL DIAGNOSTIC UNILATERAL RIGHT MAMMOGRAM
TECHNIQUE: Right digital diagnostic mammography was performed. Mammographic
images were processed with CAD.

[R ML (1 of 2)]
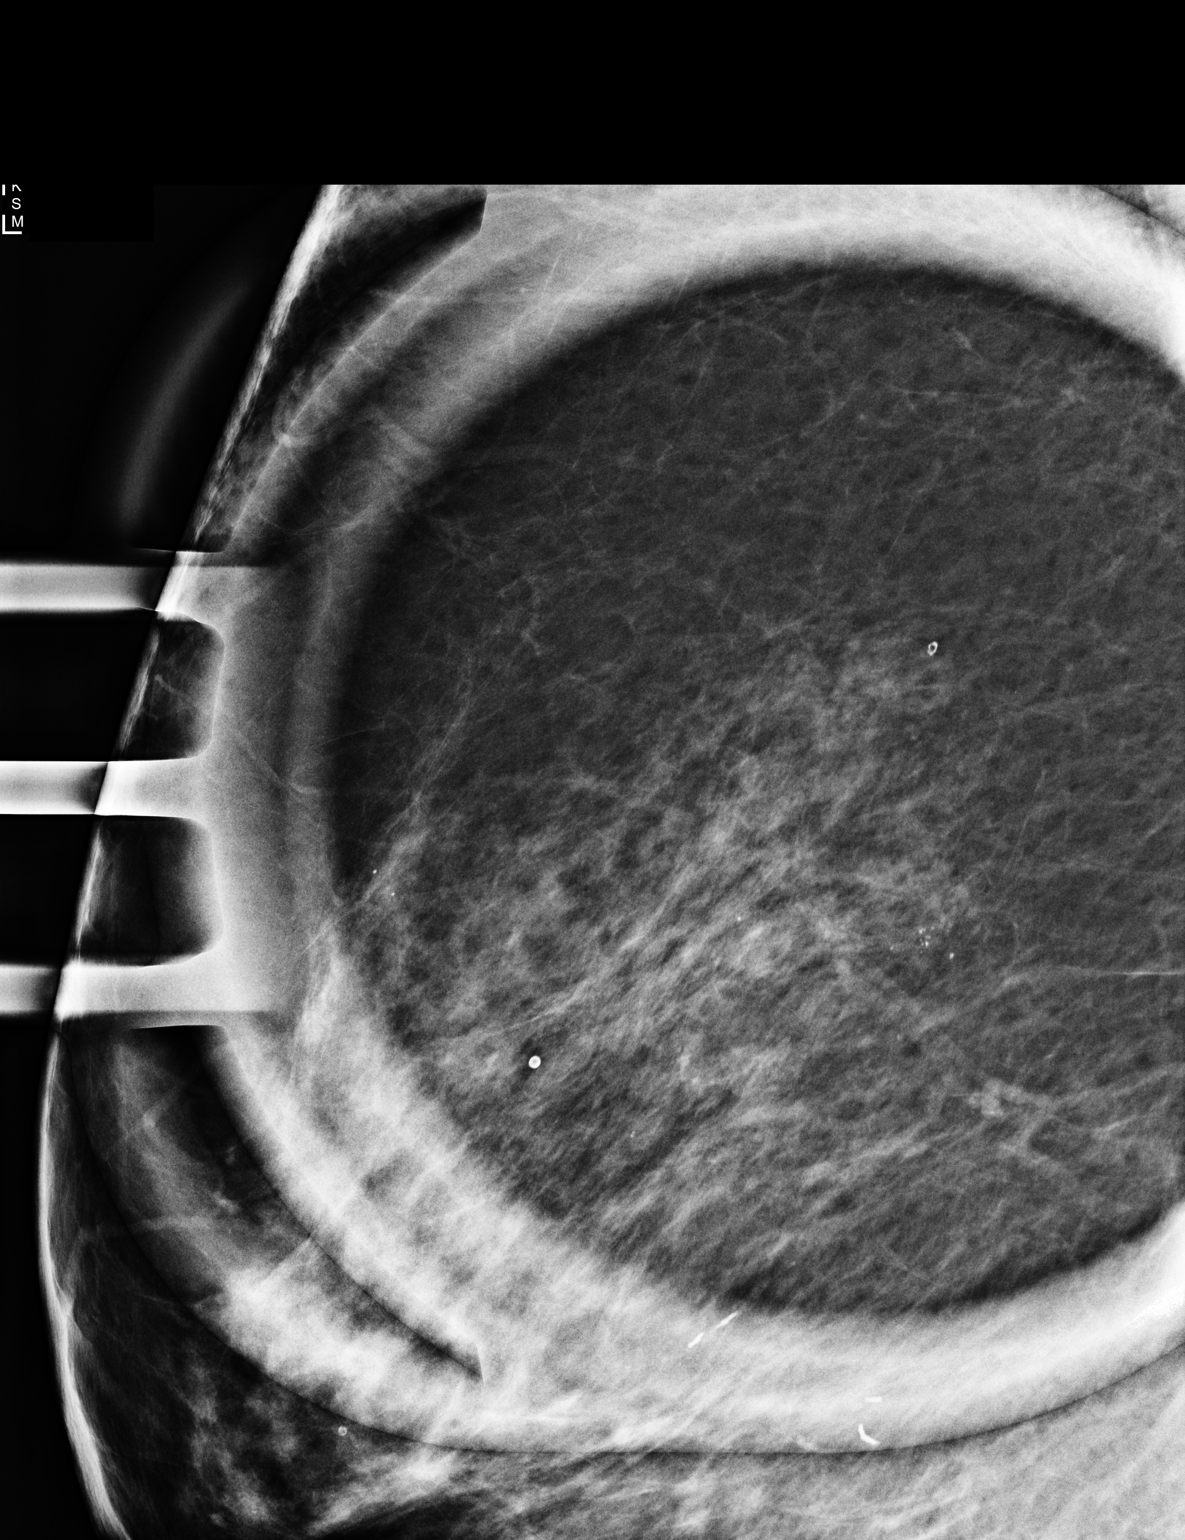

[R ML (2 of 2)]
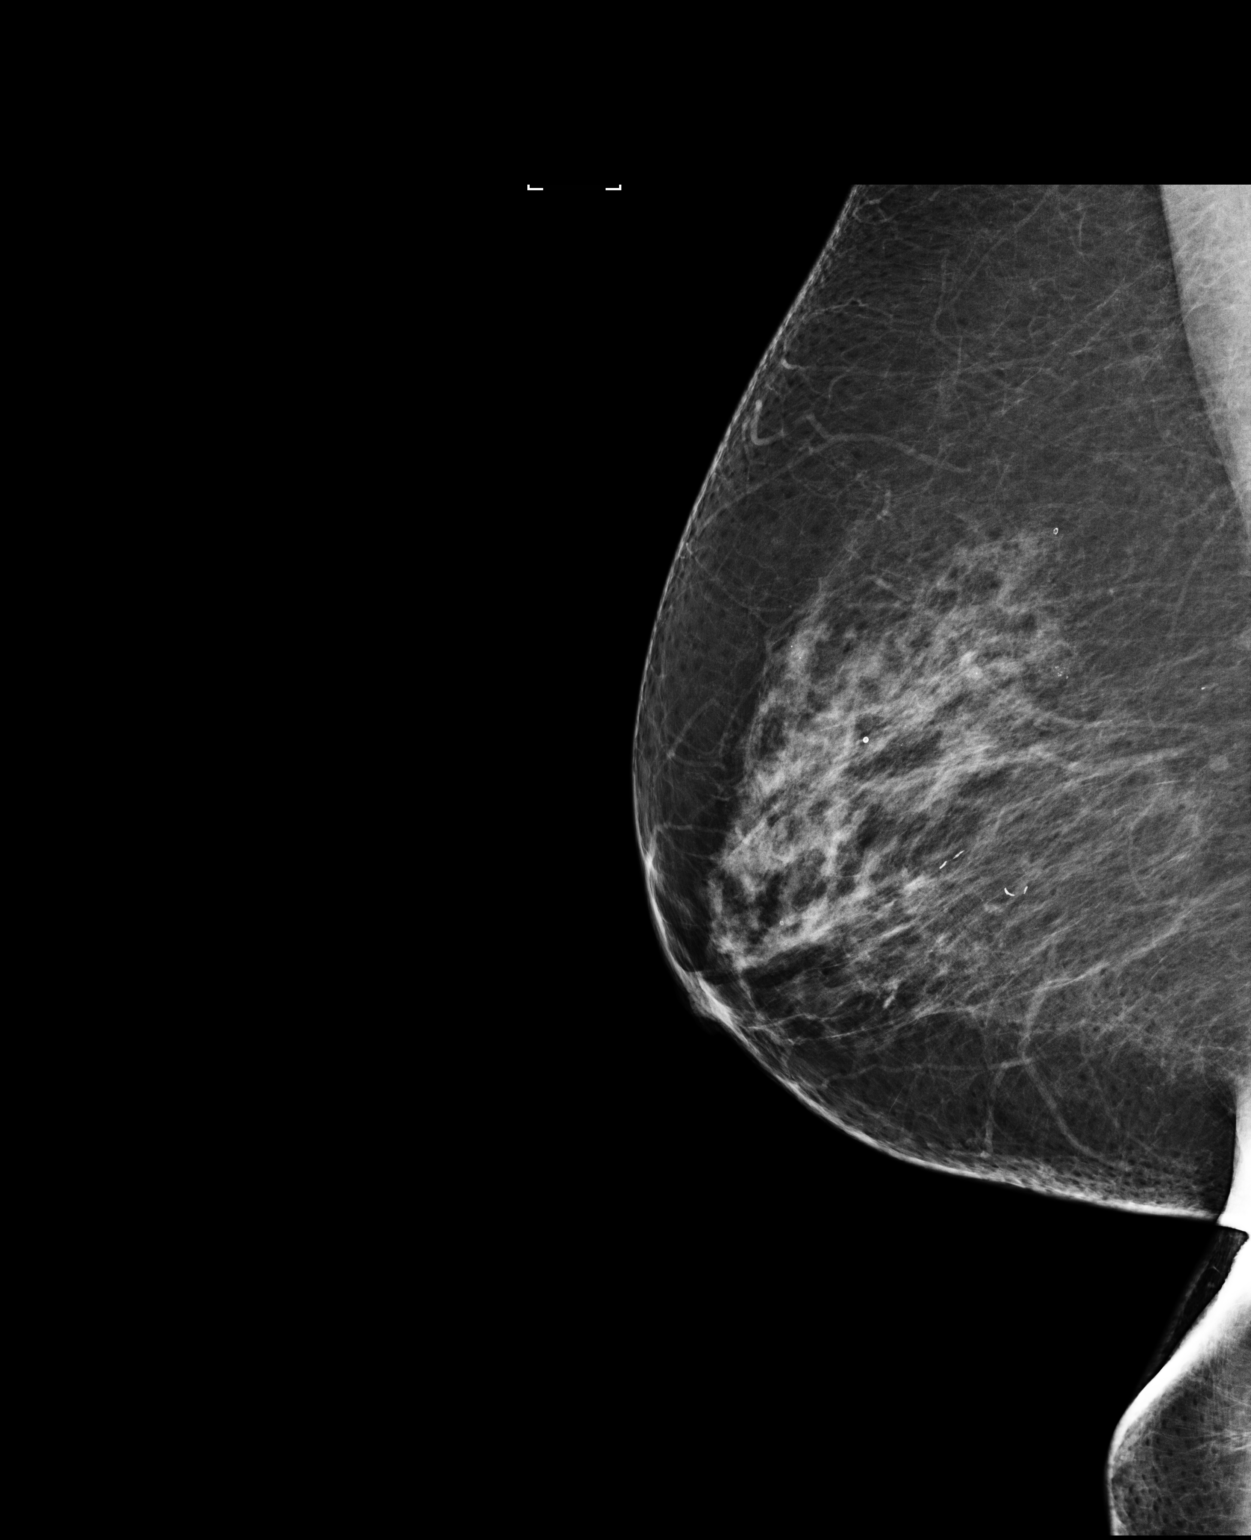

[R CC]
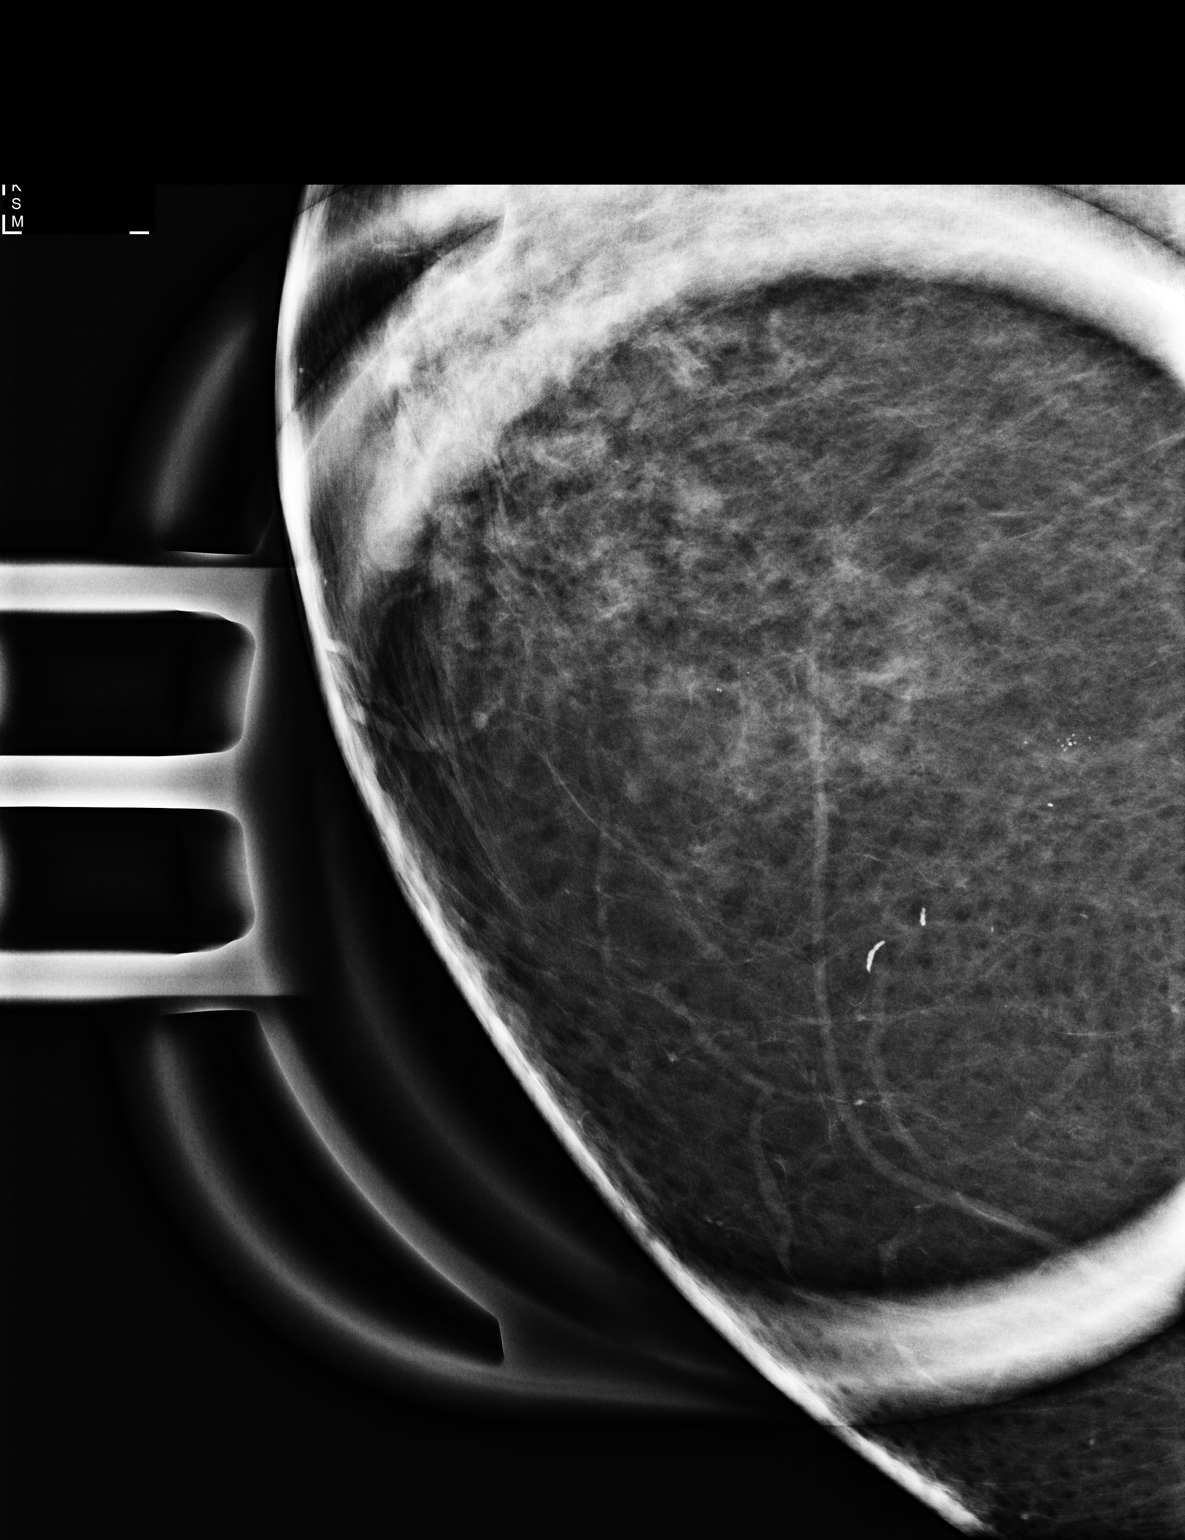

[3 of 3 positions shown; findings below may reference images not displayed]

ACR Breast Density Category c: The breast tissue is heterogeneously
dense, which may obscure small masses.
FINDINGS: Calcifications noted in the medial right breast are better assessed
on the diagnostic magnification views. There are 2 separate small
groups. More posteriorly there is a 3 mm group of small round
calcifications. Anterior and superior to this is another group of 3
or 4 small round calcifications, that also spanning 3 mm.

There is no associated mass with either group, no architectural
distortion and there is no linearity or branching. There similar
appearing calcifications elsewhere in both breasts.
IMPRESSION: 1. Two small groups of probably benign calcifications in the right
breast. Short-term follow-up recommended.

RECOMMENDATION:
1. Diagnostic right breast mammography with magnification views in 6
months.

I have discussed the findings and recommendations with the patient.
If applicable, a reminder letter will be sent to the patient
regarding the next appointment.

BI-RADS CATEGORY  3: Probably benign.

## 2022-10-16 DIAGNOSIS — Z9089 Acquired absence of other organs: Secondary | ICD-10-CM | POA: Diagnosis not present

## 2022-10-16 DIAGNOSIS — H6121 Impacted cerumen, right ear: Secondary | ICD-10-CM | POA: Diagnosis not present

## 2022-10-21 DIAGNOSIS — H353122 Nonexudative age-related macular degeneration, left eye, intermediate dry stage: Secondary | ICD-10-CM | POA: Diagnosis not present

## 2022-10-21 DIAGNOSIS — H353211 Exudative age-related macular degeneration, right eye, with active choroidal neovascularization: Secondary | ICD-10-CM | POA: Diagnosis not present

## 2022-10-21 DIAGNOSIS — H43813 Vitreous degeneration, bilateral: Secondary | ICD-10-CM | POA: Diagnosis not present

## 2022-11-14 ENCOUNTER — Other Ambulatory Visit: Payer: Self-pay | Admitting: Cardiology

## 2022-11-14 DIAGNOSIS — I4891 Unspecified atrial fibrillation: Secondary | ICD-10-CM

## 2022-11-14 NOTE — Telephone Encounter (Signed)
Prescription refill request for Eliquis received. Indication:afib Last office visit:4/24 Scr:0.95  4/24 Age: 82 Weight:85.5  kg  Prescription refilled

## 2022-12-09 DIAGNOSIS — D225 Melanocytic nevi of trunk: Secondary | ICD-10-CM | POA: Diagnosis not present

## 2022-12-09 DIAGNOSIS — L821 Other seborrheic keratosis: Secondary | ICD-10-CM | POA: Diagnosis not present

## 2022-12-09 DIAGNOSIS — L814 Other melanin hyperpigmentation: Secondary | ICD-10-CM | POA: Diagnosis not present

## 2022-12-30 DIAGNOSIS — H353211 Exudative age-related macular degeneration, right eye, with active choroidal neovascularization: Secondary | ICD-10-CM | POA: Diagnosis not present

## 2022-12-30 DIAGNOSIS — H353122 Nonexudative age-related macular degeneration, left eye, intermediate dry stage: Secondary | ICD-10-CM | POA: Diagnosis not present

## 2022-12-30 DIAGNOSIS — H43813 Vitreous degeneration, bilateral: Secondary | ICD-10-CM | POA: Diagnosis not present

## 2023-01-22 DIAGNOSIS — L650 Telogen effluvium: Secondary | ICD-10-CM | POA: Diagnosis not present

## 2023-01-22 DIAGNOSIS — L218 Other seborrheic dermatitis: Secondary | ICD-10-CM | POA: Diagnosis not present

## 2023-01-27 DIAGNOSIS — Z23 Encounter for immunization: Secondary | ICD-10-CM | POA: Diagnosis not present

## 2023-03-03 DIAGNOSIS — E039 Hypothyroidism, unspecified: Secondary | ICD-10-CM | POA: Diagnosis not present

## 2023-03-03 DIAGNOSIS — Z853 Personal history of malignant neoplasm of breast: Secondary | ICD-10-CM | POA: Diagnosis not present

## 2023-03-03 DIAGNOSIS — M810 Age-related osteoporosis without current pathological fracture: Secondary | ICD-10-CM | POA: Diagnosis not present

## 2023-03-03 DIAGNOSIS — D6869 Other thrombophilia: Secondary | ICD-10-CM | POA: Diagnosis not present

## 2023-03-03 DIAGNOSIS — Z85038 Personal history of other malignant neoplasm of large intestine: Secondary | ICD-10-CM | POA: Diagnosis not present

## 2023-03-03 DIAGNOSIS — G4733 Obstructive sleep apnea (adult) (pediatric): Secondary | ICD-10-CM | POA: Diagnosis not present

## 2023-03-03 DIAGNOSIS — Z0000 Encounter for general adult medical examination without abnormal findings: Secondary | ICD-10-CM | POA: Diagnosis not present

## 2023-03-03 DIAGNOSIS — R7303 Prediabetes: Secondary | ICD-10-CM | POA: Diagnosis not present

## 2023-03-03 DIAGNOSIS — N1831 Chronic kidney disease, stage 3a: Secondary | ICD-10-CM | POA: Diagnosis not present

## 2023-03-03 DIAGNOSIS — Z1331 Encounter for screening for depression: Secondary | ICD-10-CM | POA: Diagnosis not present

## 2023-03-03 DIAGNOSIS — I700 Atherosclerosis of aorta: Secondary | ICD-10-CM | POA: Diagnosis not present

## 2023-03-03 DIAGNOSIS — I4891 Unspecified atrial fibrillation: Secondary | ICD-10-CM | POA: Diagnosis not present

## 2023-03-03 DIAGNOSIS — E782 Mixed hyperlipidemia: Secondary | ICD-10-CM | POA: Diagnosis not present

## 2023-03-03 DIAGNOSIS — I10 Essential (primary) hypertension: Secondary | ICD-10-CM | POA: Diagnosis not present

## 2023-03-04 DIAGNOSIS — H43813 Vitreous degeneration, bilateral: Secondary | ICD-10-CM | POA: Diagnosis not present

## 2023-03-04 DIAGNOSIS — H353211 Exudative age-related macular degeneration, right eye, with active choroidal neovascularization: Secondary | ICD-10-CM | POA: Diagnosis not present

## 2023-03-04 DIAGNOSIS — H353122 Nonexudative age-related macular degeneration, left eye, intermediate dry stage: Secondary | ICD-10-CM | POA: Diagnosis not present

## 2023-03-15 NOTE — Progress Notes (Unsigned)
Cardiology Office Note:    Date:  03/16/2023   ID:  Carla Petersen, DOB 08/28/1940, MRN [id number]  PCP:  Georgann Housekeeper, MD  Cardiologist:  Little Ishikawa, MD  Electrophysiologist:  None   Referring MD: Georgann Housekeeper, MD   No chief complaint on file.    History of Present Illness:    Carla Petersen is a 82 y.o. female with a hx of Graves' disease, hyperlipidemia, prediabetes, breast cancer who presents for follow-up.  She was referred by Dr. Donette Larry for evaluation of palpitations, initially seen on 10/14/2019.  She reports that she began having palpitations around 09/26/2019.  Feels like her heart is racing during episodes.  Since episodes started she will have some days where she does not have any episodes but some days where they occur multiple times per day.  She did check her pulse during one episode and was 138.  Episode last anywhere from 10 seconds to 1 minute.  She has felt lightheaded during episodes but denies any syncope.  Also feels abdominal pain and feels like something is stuck in her throat at times.  She denies any shortness of breath or chest pain.  No regular exercise.  She quit smoking in 1995.  She does not drink coffee but drinks one 8 ounce Coke per day.  She does not drink alcohol.  Echocardiogram on 11/07/2019 showed normal biventricular function, no significant valvular disease.  Zio patch x7 days on 11/10/2019 showed 1% atrial fibrillation/atrial flutter burden, with average heart rate 152 bpm and longest episode lasting for 4 minutes.  She was started on Eliquis 5 mg twice daily and metoprolol 25 mg twice daily.  Calcium score 179 (54th percentile) in 04/2022.  Echocardiogram 10/02/2022 showed EF 60 to 65%, grade 1 diastolic dysfunction, normal RV function, aortic valve sclerosis.  Since last clinic visit, she reports she is doing okay.  Reports had chest pain last week, lasted about 30 minutes.  Describes left-sided chest pain.  She denies any shortness of  breath, lightheadedness, syncope, lower extremity edema, or palpitations.  She is taking Eliquis, denies any bleeding issues.  Past Medical History:  Diagnosis Date   Graves disease    Personal history of radiation therapy    Left Breast Cancer    Past Surgical History:  Procedure Laterality Date   BREAST EXCISIONAL BIOPSY Left    BREAST EXCISIONAL BIOPSY Left    BREAST LUMPECTOMY Left 1995   MASTECTOMY Left     Current Medications: Current Meds  Medication Sig   acetaminophen (TYLENOL) 500 MG tablet Take 500 mg by mouth. PRN   amLODipine (NORVASC) 5 MG tablet TAKE 1 TABLET BY MOUTH DAILY   apixaban (ELIQUIS) 5 MG TABS tablet TAKE 1 TABLET BY MOUTH TWICE A DAY   atorvastatin (LIPITOR) 20 MG tablet Take 1 tablet (20 mg total) by mouth daily.   calcium carbonate (OS-CAL) 1250 (500 Ca) MG chewable tablet Chew by mouth.   ferrous sulfate 220 (44 FE) MG/5ML solution Take 65 mg by mouth daily.   levothyroxine (SYNTHROID, LEVOTHROID) 25 MCG tablet Take 25 mcg by mouth daily.   metoprolol tartrate (LOPRESSOR) 50 MG tablet TAKE ONE TABLET BY MOUTH TWICE A DAY   Multiple Vitamins-Minerals (PRESERVISION AREDS 2+MULTI VIT) CAPS    Nutritional Supplements (VITAMIN D MAINTENANCE PO) Take 800 Units by mouth daily.     Allergies:   Latex, Penicillins, Sulfa antibiotics, and Pedi-pre tape spray [wound dressing adhesive]   Social History   Socioeconomic History  Marital status: Married    Spouse name: Not on file   Number of children: Not on file   Years of education: Not on file   Highest education level: Not on file  Occupational History   Not on file  Tobacco Use   Smoking status: Former   Smokeless tobacco: Former    Quit date: 07/06/1993  Substance and Sexual Activity   Alcohol use: No   Drug use: No   Sexual activity: Not on file  Other Topics Concern   Not on file  Social History Narrative   Not on file   Social Determinants of Health   Financial Resource Strain: Not  on file  Food Insecurity: Not on file  Transportation Needs: Not on file  Physical Activity: Not on file  Stress: Not on file  Social Connections: Not on file     Family History: The patient's family history is negative for Breast cancer.  ROS:   Please see the history of present illness. All other systems reviewed and are negative.  EKGs/Labs/Other Studies Reviewed:    The following studies were reviewed today:   EKG:   03/16/23: Normal sinus rhythm, rate 68, no ST abnormalities 09/02/22: Normal sinus rhythm, rate 70, no ST abnormality 03/17/22: Normal sinus rhythm, rate 72, no ST abnormalities 04/02/21: Normal sinus rhythm, rate 74, no ST abnormalities 05/30/2020: normal sinus rhythm, rate 70, no ST abnormalities 09/17/2020:  normal sinus rhythm, rate 71 bpm, no ST abnormalities  Recent Labs: 09/02/2022: BUN 16; Creatinine, Ser 0.95; Hemoglobin 15.4; Platelets 242; Potassium 4.6; Sodium 141  Recent Lipid Panel    Component Value Date/Time   CHOL 115 09/02/2022 1555   TRIG 145 09/02/2022 1555   HDL 44 09/02/2022 1555   CHOLHDL 2.6 09/02/2022 1555   LDLCALC 46 09/02/2022 1555    Physical Exam:    VS:  BP 114/62 (BP Location: Right Arm, Patient Position: Sitting, Cuff Size: Normal)   Pulse 68   Ht 5\' 3"  (1.6 m)   Wt 177 lb 3.2 oz (80.4 kg)   LMP 07/07/1983   SpO2 93%   BMI 31.39 kg/m     Wt Readings from Last 3 Encounters:  03/16/23 177 lb 3.2 oz (80.4 kg)  09/02/22 188 lb 6.4 oz (85.5 kg)  03/17/22 194 lb 3.2 oz (88.1 kg)     GEN: in no acute distress HEENT: Normal NECK: No JVD; No carotid bruits CARDIAC: RRR, 2 out of 6 systolic heart murmur RESPIRATORY:  Clear to auscultation without rales, wheezing or rhonchi  ABDOMEN: Soft, non-tender, non-distended MUSCULOSKELETAL:  no edema in lower extremity SKIN: Warm and dry NEUROLOGIC:  Alert and oriented x 3 PSYCHIATRIC:  Normal affect   ASSESSMENT:    1. Precordial pain   2. Paroxysmal atrial fibrillation  (HCC)   3. Pre-procedure lab exam   4. Heart murmur, systolic       PLAN:    Chest pain: Atypical description but does have multiple CAD risk factors (age, hypertension, hyperlipidemia, former tobacco use).  Calcium score 179 (54th percentile) in 04/2022. -Recommend coronary CTA to evaluate for obstructive CAD.  Will give Lopressor 50 mg prior to study  Paroxysmal atrial fibrillation/atrial flutter: Zio patch x7 days on 11/10/2019 showed 1% atrial fibrillation/atrial flutter burden, with average heart rate 152 bpm and longest episode lasting 4 minutes.  She was started on Eliquis 5 mg twice daily and metoprolol.  Echocardiogram on 11/07/2019 showed normal biventricular function, no significant valvular disease.  CHA2DS2-VASc score 3 (  hypertension, age x2). -Continue Eliquis 5 mg twice daily.  -Continue metoprolol 50 mg twice daily -Sleep study  showed borderline OSA overall but moderately severe REM related sleep apnea.  Patient electing to do oral appliance with Dr. Myrtis Ser.  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  Lower extremity duplex showed no DVT.  Normal BNP, albumin.  Suspect due to amlodipine use -No edema on exam today  Hypertension: On amlodipine 5 mg daily and metoprolol 50 mg twice daily.  Appears controlled  Hyperlipidemia: LDL 121 on 02/13/2022.  Calcium score 179 (54th percentile) in 04/2022.  Started on aotrvastatin 20 mg daily.  LDL 44 on 03/03/2023  OSA: treating with oral appliance from Dr. Myrtis Ser as above, but she reports hs not been using.  Encouraged compliance   RTC in 6 months  Medication Adjustments/Labs and Tests Ordered: Current medicines are reviewed at length with the patient today.  Concerns regarding medicines are outlined above.  Orders Placed This Encounter  Procedures   CT CORONARY MORPH W/CTA COR W/SCORE W/CA W/CM  &/OR WO/CM   Basic metabolic panel   EKG 12-Lead    No orders of the defined types were placed in this encounter.    There are no Patient Instructions on file for this visit.     Signed, Little Ishikawa, MD  03/16/2023 1:52 PM    [facility]

## 2023-03-16 ENCOUNTER — Ambulatory Visit: Payer: Medicare Other | Attending: Cardiology | Admitting: Cardiology

## 2023-03-16 ENCOUNTER — Encounter: Payer: Self-pay | Admitting: Cardiology

## 2023-03-16 VITALS — BP 114/62 | HR 68 | Ht 63.0 in | Wt 177.2 lb

## 2023-03-16 DIAGNOSIS — I480 Paroxysmal atrial fibrillation: Secondary | ICD-10-CM | POA: Diagnosis not present

## 2023-03-16 DIAGNOSIS — R011 Cardiac murmur, unspecified: Secondary | ICD-10-CM | POA: Diagnosis not present

## 2023-03-16 DIAGNOSIS — Z01812 Encounter for preprocedural laboratory examination: Secondary | ICD-10-CM | POA: Insufficient documentation

## 2023-03-16 DIAGNOSIS — R072 Precordial pain: Secondary | ICD-10-CM | POA: Diagnosis not present

## 2023-03-16 NOTE — Patient Instructions (Addendum)
Medication Instructions:  Continue same medications *If you need a refill on your cardiac medications before your next appointment, please call your pharmacy*   Lab Work: Bmet today   Testing/Procedures: Coronary CT will be scheduled after approved by insurance    Follow instructions below   Follow-Up: At [facility], you and your health needs are our priority.  As part of our continuing mission to provide you with exceptional heart care, we have created designated Provider Care Teams.  These Care Teams include your primary Cardiologist (physician) and Advanced Practice Providers (APPs -  Physician Assistants and Nurse Practitioners) who all work together to provide you with the care you need, when you need it.  We recommend signing up for the patient portal called "MyChart".  Sign up information is provided on this After Visit Summary.  MyChart is used to connect with patients for Virtual Visits (Telemedicine).  Patients are able to view lab/test results, encounter notes, upcoming appointments, etc.  Non-urgent messages can be sent to your provider as well.   To learn more about what you can do with MyChart, go to [email or web address].     Your next appointment:  6 months    Call in Jan to schedule April appointment     Provider:  Dr.Schumann         Your cardiac CT will be scheduled at one of the below locations:   [facility] [address] [place], [place] [address] [phone]  OR  [facility] [address] [place], [place] [address] [phone]  OR   [facility] [address] [place], [place] [address] [phone]  If scheduled at [facility], please arrive at the [facility] and Children's Entrance (Entrance C2) of [facility] 30 minutes prior to test start time. You can use the FREE valet parking offered at entrance C (encouraged to control the  heart rate for the test)  Proceed to the [facility] Radiology Department (first floor) to check-in and test prep.  All radiology patients and guests should use entrance C2 at [facility], accessed from [facility], even though the hospital's physical address listed is [address]    If scheduled at [facility] or [facility], please arrive 15 mins early for check-in and test prep.  There is spacious parking and easy access to the radiology department from the [facility] entrance. Please enter here and check-in with the desk attendant.   Please follow these instructions carefully (unless otherwise directed):  An IV will be required for this test and Nitroglycerin will be given.    On the Night Before the Test: Be sure to Drink plenty of water. Do not consume any caffeinated/decaffeinated beverages or chocolate 12 hours prior to your test. Do not take any antihistamines 12 hours prior to your test.   On the Day of the Test: Drink plenty of water until 1 hour prior to the test. Do not eat any food 1 hour prior to test. You may take your regular medications prior to the test.  Take metoprolol 50 mg two hours prior to test. FEMALES- please wear underwire-free bra if available, avoid dresses & tight clothing       After the Test: Drink plenty of water. After receiving IV contrast, you may experience a mild flushed feeling. This is normal. On occasion, you may experience a mild rash up to  24 hours after the test. This is not dangerous. If this occurs, you can take Benadryl 25 mg and increase your fluid intake. If you experience trouble breathing, this can be serious. If it is severe call 911 IMMEDIATELY. If it is mild, please call our office.   We will call to schedule your test 2-4 weeks out understanding that some insurance companies will need an authorization prior to the service being  performed.   For more information and frequently asked questions, please visit our website : [email or web address]  For non-scheduling related questions, please contact the cardiac imaging nurse navigator should you have any questions/concerns: Cardiac Imaging Nurse Navigators Direct Office Dial: [phone]   For scheduling needs, including cancellations and rescheduling, please call [place], [phone].

## 2023-03-17 LAB — BASIC METABOLIC PANEL
BUN/Creatinine Ratio: 14 (ref 12–28)
BUN: 14 mg/dL (ref 8–27)
CO2: 26 mmol/L (ref 20–29)
Calcium: 9.4 mg/dL (ref 8.7–10.3)
Chloride: 99 mmol/L (ref 96–106)
Creatinine, Ser: 1.03 mg/dL — ABNORMAL HIGH (ref 0.57–1.00)
Glucose: 113 mg/dL — ABNORMAL HIGH (ref 70–99)
Potassium: 4.4 mmol/L (ref 3.5–5.2)
Sodium: 139 mmol/L (ref 134–144)
eGFR: 54 mL/min/{1.73_m2} — ABNORMAL LOW (ref >59)

## 2023-03-20 ENCOUNTER — Other Ambulatory Visit: Payer: Self-pay | Admitting: Cardiology

## 2023-03-23 ENCOUNTER — Ambulatory Visit (HOSPITAL_COMMUNITY)
Admission: RE | Admit: 2023-03-23 | Discharge: 2023-03-23 | Disposition: A | Discharge status: Home / Self Care | Payer: Medicare Other | Source: Ambulatory Visit | Attending: Cardiology | Admitting: Cardiology

## 2023-03-23 DIAGNOSIS — R072 Precordial pain: Secondary | ICD-10-CM | POA: Diagnosis not present

## 2023-03-23 MED ORDER — NITROGLYCERIN 0.4 MG SL SUBL
0.8000 mg | SUBLINGUAL_TABLET | Freq: Once | SUBLINGUAL | Status: AC
  Administered 2023-03-23: 0.8 mg via SUBLINGUAL

## 2023-03-23 MED ORDER — NITROGLYCERIN 0.4 MG SL SUBL
SUBLINGUAL_TABLET | SUBLINGUAL | Status: AC
  Filled 2023-03-23: qty 2

## 2023-03-23 MED ORDER — IOHEXOL 350 MG/ML SOLN
100.0000 mL | Freq: Once | INTRAVENOUS | Status: AC | PRN
Start: 2023-03-23 — End: 2023-03-23
  Administered 2023-03-23: 100 mL via INTRAVENOUS

## 2023-04-06 DIAGNOSIS — M25552 Pain in left hip: Secondary | ICD-10-CM | POA: Diagnosis not present

## 2023-04-06 DIAGNOSIS — M545 Low back pain, unspecified: Secondary | ICD-10-CM | POA: Diagnosis not present

## 2023-04-29 DIAGNOSIS — H353211 Exudative age-related macular degeneration, right eye, with active choroidal neovascularization: Secondary | ICD-10-CM | POA: Diagnosis not present

## 2023-04-29 DIAGNOSIS — H43813 Vitreous degeneration, bilateral: Secondary | ICD-10-CM | POA: Diagnosis not present

## 2023-04-29 DIAGNOSIS — H353122 Nonexudative age-related macular degeneration, left eye, intermediate dry stage: Secondary | ICD-10-CM | POA: Diagnosis not present

## 2023-05-19 ENCOUNTER — Other Ambulatory Visit: Payer: Self-pay | Admitting: Cardiology

## 2023-05-19 DIAGNOSIS — I4891 Unspecified atrial fibrillation: Secondary | ICD-10-CM

## 2023-05-19 NOTE — Telephone Encounter (Signed)
 Prescription refill request for Eliquis received. Indication: a fib Last office visit: 03/16/23 Scr: 1.03 epic 03/16/23 Age: 82 Weight: 80kg

## 2023-06-05 ENCOUNTER — Other Ambulatory Visit: Payer: Self-pay | Admitting: Internal Medicine

## 2023-06-05 DIAGNOSIS — R921 Mammographic calcification found on diagnostic imaging of breast: Secondary | ICD-10-CM

## 2023-06-24 DIAGNOSIS — H353211 Exudative age-related macular degeneration, right eye, with active choroidal neovascularization: Secondary | ICD-10-CM | POA: Diagnosis not present

## 2023-06-24 DIAGNOSIS — H353122 Nonexudative age-related macular degeneration, left eye, intermediate dry stage: Secondary | ICD-10-CM | POA: Diagnosis not present

## 2023-06-24 DIAGNOSIS — H43813 Vitreous degeneration, bilateral: Secondary | ICD-10-CM | POA: Diagnosis not present

## 2023-07-03 ENCOUNTER — Other Ambulatory Visit: Payer: Self-pay | Admitting: Cardiology

## 2023-07-06 ENCOUNTER — Ambulatory Visit
Admission: RE | Admit: 2023-07-06 | Discharge: 2023-07-06 | Disposition: A | Discharge status: Home / Self Care | Payer: Medicare Other | Source: Ambulatory Visit | Attending: Internal Medicine | Admitting: Internal Medicine

## 2023-07-06 DIAGNOSIS — R921 Mammographic calcification found on diagnostic imaging of breast: Secondary | ICD-10-CM

## 2023-07-06 DIAGNOSIS — R92333 Mammographic heterogeneous density, bilateral breasts: Secondary | ICD-10-CM | POA: Diagnosis not present

## 2023-07-06 DIAGNOSIS — Z853 Personal history of malignant neoplasm of breast: Secondary | ICD-10-CM | POA: Diagnosis not present

## 2023-08-19 DIAGNOSIS — H353122 Nonexudative age-related macular degeneration, left eye, intermediate dry stage: Secondary | ICD-10-CM | POA: Diagnosis not present

## 2023-08-19 DIAGNOSIS — H43813 Vitreous degeneration, bilateral: Secondary | ICD-10-CM | POA: Diagnosis not present

## 2023-08-19 DIAGNOSIS — H353211 Exudative age-related macular degeneration, right eye, with active choroidal neovascularization: Secondary | ICD-10-CM | POA: Diagnosis not present

## 2023-09-02 DIAGNOSIS — D6869 Other thrombophilia: Secondary | ICD-10-CM | POA: Diagnosis not present

## 2023-09-02 DIAGNOSIS — I10 Essential (primary) hypertension: Secondary | ICD-10-CM | POA: Diagnosis not present

## 2023-09-02 DIAGNOSIS — R7303 Prediabetes: Secondary | ICD-10-CM | POA: Diagnosis not present

## 2023-09-02 DIAGNOSIS — E782 Mixed hyperlipidemia: Secondary | ICD-10-CM | POA: Diagnosis not present

## 2023-09-02 DIAGNOSIS — I700 Atherosclerosis of aorta: Secondary | ICD-10-CM | POA: Diagnosis not present

## 2023-09-02 DIAGNOSIS — Z853 Personal history of malignant neoplasm of breast: Secondary | ICD-10-CM | POA: Diagnosis not present

## 2023-09-02 DIAGNOSIS — N1831 Chronic kidney disease, stage 3a: Secondary | ICD-10-CM | POA: Diagnosis not present

## 2023-09-02 DIAGNOSIS — R011 Cardiac murmur, unspecified: Secondary | ICD-10-CM | POA: Diagnosis not present

## 2023-09-02 DIAGNOSIS — E039 Hypothyroidism, unspecified: Secondary | ICD-10-CM | POA: Diagnosis not present

## 2023-09-02 DIAGNOSIS — I4891 Unspecified atrial fibrillation: Secondary | ICD-10-CM | POA: Diagnosis not present

## 2023-09-02 DIAGNOSIS — G4733 Obstructive sleep apnea (adult) (pediatric): Secondary | ICD-10-CM | POA: Diagnosis not present

## 2023-09-13 NOTE — Progress Notes (Signed)
 Cardiology Office Note:    Date:  09/17/2023   ID:  Carla Petersen, DOB December 15, 1940, MRN [id number]  PCP:  Carla Mina, MD  Cardiologist:  Wendie Hamburg, MD  Electrophysiologist:  None   Referring MD: Carla Mina, MD   Chief Complaint  Patient presents with   Atrial Fibrillation    History of Present Illness:    Carla Petersen is a 83 y.o. female with a hx of Graves' disease, hyperlipidemia, prediabetes, breast cancer who presents for follow-up.  She was referred by Dr. Husain for evaluation of palpitations, initially seen on 10/14/2019.  She reports that she began having palpitations around 09/26/2019.  Feels like her heart is racing during episodes.  Since episodes started she will have some days where she does not have any episodes but some days where they occur multiple times per day.  She did check her pulse during one episode and was 138.  Episode last anywhere from 10 seconds to 1 minute.  She has felt lightheaded during episodes but denies any syncope.  Also feels abdominal pain and feels like something is stuck in her throat at times.  She denies any shortness of breath or chest pain.  No regular exercise.  She quit smoking in 1995.  She does not drink coffee but drinks one 8 ounce Coke per day.  She does not drink alcohol.  Echocardiogram on 11/07/2019 showed normal biventricular function, no significant valvular disease.  Zio patch x7 days on 11/10/2019 showed 1% atrial fibrillation/atrial flutter burden, with average heart rate 152 bpm and longest episode lasting for 4 minutes.  She was started on Eliquis  5 mg twice daily and metoprolol  25 mg twice daily.  Calcium  score 179 (54th percentile) in 04/2022.  Echocardiogram 10/02/2022 showed EF 60 to 65%, grade 1 diastolic dysfunction, normal RV function, aortic valve sclerosis.  Coronary CTA 03/23/2023 showed nonobstructive CAD with less than 25% stenosis in LAD and RCA, calcium  score 212 (56 percentile).  Since last clinic visit,  she reports she is doing okay.  Denies any recent chest pain.  Continues to have intermittent lower extremity edema.  She is taking Eliquis , did have some recent bleeding after dental procedure but otherwise denies any bleeding issues. Denies any dyspnea, lightheadedness, syncope, or palpitations.    Past Medical History:  Diagnosis Date   Graves disease    Personal history of radiation therapy    Left Breast Cancer    Past Surgical History:  Procedure Laterality Date   BREAST EXCISIONAL BIOPSY Left    BREAST EXCISIONAL BIOPSY Left    BREAST LUMPECTOMY Left 1995   MASTECTOMY Left     Current Medications: Current Meds  Medication Sig   acetaminophen (TYLENOL) 500 MG tablet Take 500 mg by mouth. PRN   amLODipine  (NORVASC ) 5 MG tablet TAKE 1 TABLET BY MOUTH DAILY   atorvastatin  (LIPITOR) 20 MG tablet TAKE 1 TABLET BY MOUTH DAILY   calcium  carbonate (OS-CAL) 1250 (500 Ca) MG chewable tablet Chew by mouth.   ELIQUIS  5 MG TABS tablet TAKE 1 TABLET BY MOUTH 2 TIMES A DAY   ferrous sulfate 220 (44 FE) MG/5ML solution Take 65 mg by mouth daily.   levothyroxine (SYNTHROID, LEVOTHROID) 25 MCG tablet Take 25 mcg by mouth daily.   metoprolol  tartrate (LOPRESSOR ) 50 MG tablet TAKE 1 TABLET BY MOUTH TWICE A DAY   Multiple Vitamins-Minerals (PRESERVISION AREDS 2+MULTI VIT) CAPS    Nutritional Supplements (VITAMIN D MAINTENANCE PO) Take 800 Units by mouth daily.  Allergies:   Latex, Penicillins, Sulfa antibiotics, and Pedi-pre tape spray [wound dressing adhesive]   Social History   Socioeconomic History   Marital status: Married    Spouse name: Not on file   Number of children: Not on file   Years of education: Not on file   Highest education level: Not on file  Occupational History   Not on file  Tobacco Use   Smoking status: Former   Smokeless tobacco: Former    Quit date: 07/06/1993  Substance and Sexual Activity   Alcohol use: No   Drug use: No   Sexual activity: Not on file   Other Topics Concern   Not on file  Social History Narrative   Not on file   Social Drivers of Health   Financial Resource Strain: Not on file  Food Insecurity: Not on file  Transportation Needs: Not on file  Physical Activity: Not on file  Stress: Not on file  Social Connections: Not on file     Family History: The patient's family history is negative for Breast cancer.  ROS:   Please see the history of present illness. All other systems reviewed and are negative.  EKGs/Labs/Other Studies Reviewed:    The following studies were reviewed today:   EKG:   09/17/2023: Normal sinus rhythm, rate 72, no ST abnormalities 03/16/23: Normal sinus rhythm, rate 68, no ST abnormalities 09/02/22: Normal sinus rhythm, rate 70, no ST abnormality 03/17/22: Normal sinus rhythm, rate 72, no ST abnormalities 04/02/21: Normal sinus rhythm, rate 74, no ST abnormalities 05/30/2020: normal sinus rhythm, rate 70, no ST abnormalities 09/17/2020:  normal sinus rhythm, rate 71 bpm, no ST abnormalities  Recent Labs: 03/16/2023: BUN 14; Creatinine, Ser 1.03; Potassium 4.4; Sodium 139  Recent Lipid Panel    Component Value Date/Time   CHOL 115 09/02/2022 1555   TRIG 145 09/02/2022 1555   HDL 44 09/02/2022 1555   CHOLHDL 2.6 09/02/2022 1555   LDLCALC 46 09/02/2022 1555    Physical Exam:    VS:  BP 132/60 (BP Location: Left Arm, Patient Position: Sitting, Cuff Size: Normal)   Pulse 72   Ht 5\' 3"  (1.6 m)   Wt 174 lb 3.2 oz (79 kg)   LMP 07/07/1983   SpO2 93%   BMI 30.86 kg/m     Wt Readings from Last 3 Encounters:  09/17/23 174 lb 3.2 oz (79 kg)  03/16/23 177 lb 3.2 oz (80.4 kg)  09/02/22 188 lb 6.4 oz (85.5 kg)     GEN: in no acute distress HEENT: Normal NECK: No JVD; No carotid bruits CARDIAC: RRR, 2 out of 6 systolic heart murmur RESPIRATORY:  Clear to auscultation without rales, wheezing or rhonchi  ABDOMEN: Soft, non-tender, non-distended MUSCULOSKELETAL:  no edema in lower  extremity SKIN: Warm and dry NEUROLOGIC:  Alert and oriented x 3 PSYCHIATRIC:  Normal affect   ASSESSMENT:    1. Paroxysmal atrial fibrillation (HCC)   2. Coronary artery disease involving native coronary artery of native heart without angina pectoris   3. Essential hypertension   4. Hyperlipidemia, unspecified hyperlipidemia type      PLAN:    CAD: Reported atypical chest pain.  Coronary CTA 03/23/2023 showed nonobstructive CAD with less than 25% stenosis in LAD and RCA, calcium  score 212 (56 percentile). - Continue atorvastatin  20 mg daily  Paroxysmal atrial fibrillation/atrial flutter: Zio patch x7 days on 11/10/2019 showed 1% atrial fibrillation/atrial flutter burden, with average heart rate 152 bpm and longest episode lasting 4  minutes.  She was started on Eliquis  5 mg twice daily and metoprolol .  Echocardiogram on 11/07/2019 showed normal biventricular function, no significant valvular disease.  CHA2DS2-VASc score 3 (hypertension, age x2). -Continue Eliquis  5 mg twice daily. Check CBC -Continue metoprolol  50 mg twice daily -Sleep study showed borderline OSA overall but moderately severe REM related sleep apnea.  Patient electing to do oral appliance with Dr. 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  Lower extremity duplex showed no DVT.  Normal BNP, albumin.  Suspect due to amlodipine  use -No edema on exam today  Hypertension: On amlodipine  5 mg daily and metoprolol  50 mg twice daily.  Appears controlled  Hyperlipidemia: LDL 121 on 02/13/2022.  Calcium  score 179 (54th percentile) in 04/2022.  Started on aotrvastatin 20 mg daily.  LDL 44 on 03/03/2023  OSA: treating with oral appliance from Dr. Ardell Koller as above, encouraged compliance   RTC in 6 months  Medication Adjustments/Labs and Tests Ordered: Current medicines are reviewed at length with the patient today.  Concerns regarding medicines are  outlined above.  Orders Placed This Encounter  Procedures   CBC w/Diff/Platelet   Basic Metabolic Panel (BMET)   EKG 12-Lead    No orders of the defined types were placed in this encounter.    Patient Instructions  Medication Instructions:  Continue current medications *If you need a refill on your cardiac medications before your next appointment, please call your pharmacy*  Lab Work: Cbc, bmet If you have labs (blood work) drawn today and your tests are completely normal, you will receive your results only by: MyChart Message (if you have MyChart) OR A paper copy in the mail If you have any lab test that is abnormal or we need to change your treatment, we will call you to review the results.  Testing/Procedures: none  Follow-Up: At [facility], you and your health needs are our priority.  As part of our continuing mission to provide you with exceptional heart care, our providers are all part of one team.  This team includes your primary Cardiologist (physician) and Advanced Practice Providers or APPs (Physician Assistants and Nurse Practitioners) who all work together to provide you with the care you need, when you need it.  Your next appointment:   6 month(s)  Provider:   Wendie Hamburg, MD  We recommend signing up for the patient portal called "MyChart".  Sign up information is provided on this After Visit Summary.  MyChart is used to connect with patients for Virtual Visits (Telemedicine).  Patients are able to view lab/test results, encounter notes, upcoming appointments, etc.  Non-urgent messages can be sent to your provider as well.   To learn more about what you can do with MyChart, go to [email or web address].   Other Instructions none         Signed, Wendie Hamburg, MD  09/17/2023 5:30 PM    [facility]

## 2023-09-17 ENCOUNTER — Encounter: Payer: Self-pay | Admitting: Cardiology

## 2023-09-17 ENCOUNTER — Ambulatory Visit: Payer: Medicare Other | Attending: Cardiology | Admitting: Cardiology

## 2023-09-17 VITALS — BP 132/60 | HR 72 | Ht 63.0 in | Wt 174.2 lb

## 2023-09-17 DIAGNOSIS — I10 Essential (primary) hypertension: Secondary | ICD-10-CM | POA: Insufficient documentation

## 2023-09-17 DIAGNOSIS — E785 Hyperlipidemia, unspecified: Secondary | ICD-10-CM | POA: Diagnosis not present

## 2023-09-17 DIAGNOSIS — I2510 Atherosclerotic heart disease of native coronary artery without angina pectoris: Secondary | ICD-10-CM | POA: Insufficient documentation

## 2023-09-17 DIAGNOSIS — I480 Paroxysmal atrial fibrillation: Secondary | ICD-10-CM | POA: Diagnosis not present

## 2023-09-17 LAB — CBC WITH DIFFERENTIAL/PLATELET
Basophils Absolute: 0.1 10*3/uL (ref 0.0–0.2)
Basos: 1 %
EOS (ABSOLUTE): 0.1 10*3/uL (ref 0.0–0.4)
Eos: 1 %
Hematocrit: 44 % (ref 34.0–46.6)
Hemoglobin: 14.7 g/dL (ref 11.1–15.9)
Immature Grans (Abs): 0 10*3/uL (ref 0.0–0.1)
Immature Granulocytes: 0 %
Lymphocytes Absolute: 2.3 10*3/uL (ref 0.7–3.1)
Lymphs: 26 %
MCH: 31.4 pg (ref 26.6–33.0)
MCHC: 33.4 g/dL (ref 31.5–35.7)
MCV: 94 fL (ref 79–97)
Monocytes Absolute: 0.9 10*3/uL (ref 0.1–0.9)
Monocytes: 10 %
Neutrophils Absolute: 5.4 10*3/uL (ref 1.4–7.0)
Neutrophils: 62 %
Platelets: 239 10*3/uL (ref 150–450)
RBC: 4.68 x10E6/uL (ref 3.77–5.28)
RDW: 13.1 % (ref 11.7–15.4)
WBC: 8.8 10*3/uL (ref 3.4–10.8)

## 2023-09-17 LAB — BASIC METABOLIC PANEL WITH GFR
BUN/Creatinine Ratio: 16 (ref 12–28)
BUN: 15 mg/dL (ref 8–27)
CO2: 25 mmol/L (ref 20–29)
Calcium: 9.3 mg/dL (ref 8.7–10.3)
Chloride: 101 mmol/L (ref 96–106)
Creatinine, Ser: 0.93 mg/dL (ref 0.57–1.00)
Glucose: 92 mg/dL (ref 70–99)
Potassium: 4.1 mmol/L (ref 3.5–5.2)
Sodium: 140 mmol/L (ref 134–144)
eGFR: 60 mL/min/{1.73_m2} (ref >59)

## 2023-09-17 NOTE — Patient Instructions (Signed)
 Medication Instructions:  Continue current medications *If you need a refill on your cardiac medications before your next appointment, please call your pharmacy*  Lab Work: Cbc, bmet If you have labs (blood work) drawn today and your tests are completely normal, you will receive your results only by: MyChart Message (if you have MyChart) OR A paper copy in the mail If you have any lab test that is abnormal or we need to change your treatment, we will call you to review the results.  Testing/Procedures: none  Follow-Up: At [facility], you and your health needs are our priority.  As part of our continuing mission to provide you with exceptional heart care, our providers are all part of one team.  This team includes your primary Cardiologist (physician) and Advanced Practice Providers or APPs (Physician Assistants and Nurse Practitioners) who all work together to provide you with the care you need, when you need it.  Your next appointment:   6 month(s)  Provider:   Wendie Hamburg, MD  We recommend signing up for the patient portal called "MyChart".  Sign up information is provided on this After Visit Summary.  MyChart is used to connect with patients for Virtual Visits (Telemedicine).  Patients are able to view lab/test results, encounter notes, upcoming appointments, etc.  Non-urgent messages can be sent to your provider as well.   To learn more about what you can do with MyChart, go to [email or web address].   Other Instructions none

## 2023-09-29 ENCOUNTER — Other Ambulatory Visit: Payer: Self-pay | Admitting: Cardiology

## 2023-10-07 DIAGNOSIS — H43813 Vitreous degeneration, bilateral: Secondary | ICD-10-CM | POA: Diagnosis not present

## 2023-10-07 DIAGNOSIS — H353211 Exudative age-related macular degeneration, right eye, with active choroidal neovascularization: Secondary | ICD-10-CM | POA: Diagnosis not present

## 2023-10-07 DIAGNOSIS — H353122 Nonexudative age-related macular degeneration, left eye, intermediate dry stage: Secondary | ICD-10-CM | POA: Diagnosis not present

## 2023-11-15 ENCOUNTER — Other Ambulatory Visit: Payer: Self-pay | Admitting: Cardiology

## 2023-11-15 DIAGNOSIS — I4891 Unspecified atrial fibrillation: Secondary | ICD-10-CM

## 2023-11-16 NOTE — Telephone Encounter (Signed)
 Prescription refill request for Eliquis  received. Indication:afib Last office visit:5/25 Scr:0.93  5/25 Age: 83 Weight:79  kg  Prescription refilled

## 2023-12-01 ENCOUNTER — Encounter: Payer: Self-pay | Admitting: Cardiology

## 2023-12-03 DIAGNOSIS — H43813 Vitreous degeneration, bilateral: Secondary | ICD-10-CM | POA: Diagnosis not present

## 2023-12-03 DIAGNOSIS — H353122 Nonexudative age-related macular degeneration, left eye, intermediate dry stage: Secondary | ICD-10-CM | POA: Diagnosis not present

## 2023-12-03 DIAGNOSIS — H353211 Exudative age-related macular degeneration, right eye, with active choroidal neovascularization: Secondary | ICD-10-CM | POA: Diagnosis not present

## 2023-12-12 ENCOUNTER — Other Ambulatory Visit: Payer: Self-pay | Admitting: Cardiology

## 2024-01-21 DIAGNOSIS — Z23 Encounter for immunization: Secondary | ICD-10-CM | POA: Diagnosis not present

## 2024-01-27 DIAGNOSIS — H43392 Other vitreous opacities, left eye: Secondary | ICD-10-CM | POA: Diagnosis not present

## 2024-01-27 DIAGNOSIS — H43813 Vitreous degeneration, bilateral: Secondary | ICD-10-CM | POA: Diagnosis not present

## 2024-01-27 DIAGNOSIS — H353211 Exudative age-related macular degeneration, right eye, with active choroidal neovascularization: Secondary | ICD-10-CM | POA: Diagnosis not present

## 2024-01-27 DIAGNOSIS — H353122 Nonexudative age-related macular degeneration, left eye, intermediate dry stage: Secondary | ICD-10-CM | POA: Diagnosis not present

## 2024-02-16 DIAGNOSIS — Z23 Encounter for immunization: Secondary | ICD-10-CM | POA: Diagnosis not present

## 2024-03-09 DIAGNOSIS — M810 Age-related osteoporosis without current pathological fracture: Secondary | ICD-10-CM | POA: Diagnosis not present

## 2024-03-09 DIAGNOSIS — D6869 Other thrombophilia: Secondary | ICD-10-CM | POA: Diagnosis not present

## 2024-03-09 DIAGNOSIS — F419 Anxiety disorder, unspecified: Secondary | ICD-10-CM | POA: Diagnosis not present

## 2024-03-09 DIAGNOSIS — Z85038 Personal history of other malignant neoplasm of large intestine: Secondary | ICD-10-CM | POA: Diagnosis not present

## 2024-03-09 DIAGNOSIS — E039 Hypothyroidism, unspecified: Secondary | ICD-10-CM | POA: Diagnosis not present

## 2024-03-09 DIAGNOSIS — I4891 Unspecified atrial fibrillation: Secondary | ICD-10-CM | POA: Diagnosis not present

## 2024-03-09 DIAGNOSIS — E782 Mixed hyperlipidemia: Secondary | ICD-10-CM | POA: Diagnosis not present

## 2024-03-09 DIAGNOSIS — Z23 Encounter for immunization: Secondary | ICD-10-CM | POA: Diagnosis not present

## 2024-03-09 DIAGNOSIS — Z853 Personal history of malignant neoplasm of breast: Secondary | ICD-10-CM | POA: Diagnosis not present

## 2024-03-09 DIAGNOSIS — Z0000 Encounter for general adult medical examination without abnormal findings: Secondary | ICD-10-CM | POA: Diagnosis not present

## 2024-03-09 DIAGNOSIS — R7303 Prediabetes: Secondary | ICD-10-CM | POA: Diagnosis not present

## 2024-03-09 DIAGNOSIS — N1831 Chronic kidney disease, stage 3a: Secondary | ICD-10-CM | POA: Diagnosis not present

## 2024-03-09 DIAGNOSIS — Z1331 Encounter for screening for depression: Secondary | ICD-10-CM | POA: Diagnosis not present

## 2024-03-09 DIAGNOSIS — I10 Essential (primary) hypertension: Secondary | ICD-10-CM | POA: Diagnosis not present

## 2024-03-09 DIAGNOSIS — G4733 Obstructive sleep apnea (adult) (pediatric): Secondary | ICD-10-CM | POA: Diagnosis not present

## 2024-03-23 DIAGNOSIS — H353122 Nonexudative age-related macular degeneration, left eye, intermediate dry stage: Secondary | ICD-10-CM | POA: Diagnosis not present

## 2024-03-23 DIAGNOSIS — H353211 Exudative age-related macular degeneration, right eye, with active choroidal neovascularization: Secondary | ICD-10-CM | POA: Diagnosis not present

## 2024-03-23 DIAGNOSIS — H43392 Other vitreous opacities, left eye: Secondary | ICD-10-CM | POA: Diagnosis not present

## 2024-03-23 DIAGNOSIS — H43813 Vitreous degeneration, bilateral: Secondary | ICD-10-CM | POA: Diagnosis not present

## 2024-03-28 NOTE — Progress Notes (Deleted)
 Cardiology Office Note:    Date:  03/28/2024   ID:  Carla Petersen, DOB 23-Jan-1941, MRN [id number]  PCP:  Ransom Other, MD  Cardiologist:  Lonni LITTIE Nanas, MD  Electrophysiologist:  None   Referring MD: Ransom Other, MD   No chief complaint on file.   History of Present Illness:    Carla Petersen is a 83 y.o. female with a hx of Graves' disease, hyperlipidemia, prediabetes, breast cancer who presents for follow-up.  She was referred by Dr. Husain for evaluation of palpitations, initially seen on 10/14/2019.  She reports that she began having palpitations around 09/26/2019.  Feels like her heart is racing during episodes.  Since episodes started she will have some days where she does not have any episodes but some days where they occur multiple times per day.  She did check her pulse during one episode and was 138.  Episode last anywhere from 10 seconds to 1 minute.  She has felt lightheaded during episodes but denies any syncope.  Also feels abdominal pain and feels like something is stuck in her throat at times.  She denies any shortness of breath or chest pain.  No regular exercise.  She quit smoking in 1995.  She does not drink coffee but drinks one 8 ounce Coke per day.  She does not drink alcohol.  Echocardiogram on 11/07/2019 showed normal biventricular function, no significant valvular disease.  Zio patch x7 days on 11/10/2019 showed 1% atrial fibrillation/atrial flutter burden, with average heart rate 152 bpm and longest episode lasting for 4 minutes.  She was started on Eliquis  5 mg twice daily and metoprolol  25 mg twice daily.  Calcium  score 179 (54th percentile) in 04/2022.  Echocardiogram 10/02/2022 showed EF 60 to 65%, grade 1 diastolic dysfunction, normal RV function, aortic valve sclerosis.  Coronary CTA 03/23/2023 showed nonobstructive CAD with less than 25% stenosis in LAD and RCA, calcium  score 212 (56 percentile).  Since last clinic visit,  she reports she is doing okay.   Denies any recent chest pain.  Continues to have intermittent lower extremity edema.  She is taking Eliquis , did have some recent bleeding after dental procedure but otherwise denies any bleeding issues. Denies any dyspnea, lightheadedness, syncope, or palpitations.    Past Medical History:  Diagnosis Date   Graves disease    Personal history of radiation therapy    Left Breast Cancer    Past Surgical History:  Procedure Laterality Date   BREAST EXCISIONAL BIOPSY Left    BREAST EXCISIONAL BIOPSY Left    BREAST LUMPECTOMY Left 1995   MASTECTOMY Left     Current Medications: No outpatient medications have been marked as taking for the 03/30/24 encounter (Appointment) with Nanas Lonni LITTIE, MD.     Allergies:   Latex, Penicillins, Sulfa antibiotics, and Pedi-pre tape spray [wound dressing adhesive]   Social History   Socioeconomic History   Marital status: Married    Spouse name: Not on file   Number of children: Not on file   Years of education: Not on file   Highest education level: Not on file  Occupational History   Not on file  Tobacco Use   Smoking status: Former   Smokeless tobacco: Former    Quit date: 07/06/1993  Substance and Sexual Activity   Alcohol use: No   Drug use: No   Sexual activity: Not on file  Other Topics Concern   Not on file  Social History Narrative   Not on file  Social Drivers of Corporate Investment Banker Strain: Not on file  Food Insecurity: Not on file  Transportation Needs: Not on file  Physical Activity: Not on file  Stress: Not on file  Social Connections: Not on file     Family History: The patient's family history is negative for Breast cancer.  ROS:   Please see the history of present illness. All other systems reviewed and are negative.  EKGs/Labs/Other Studies Reviewed:    The following studies were reviewed today:   EKG:   09/17/2023: Normal sinus rhythm, rate 72, no ST abnormalities 03/16/23: Normal  sinus rhythm, rate 68, no ST abnormalities 09/02/22: Normal sinus rhythm, rate 70, no ST abnormality 03/17/22: Normal sinus rhythm, rate 72, no ST abnormalities 04/02/21: Normal sinus rhythm, rate 74, no ST abnormalities 05/30/2020: normal sinus rhythm, rate 70, no ST abnormalities 09/17/2020:  normal sinus rhythm, rate 71 bpm, no ST abnormalities  Recent Labs: 09/17/2023: BUN 15; Creatinine, Ser 0.93; Hemoglobin 14.7; Platelets 239; Potassium 4.1; Sodium 140  Recent Lipid Panel    Component Value Date/Time   CHOL 115 09/02/2022 1555   TRIG 145 09/02/2022 1555   HDL 44 09/02/2022 1555   CHOLHDL 2.6 09/02/2022 1555   LDLCALC 46 09/02/2022 1555    Physical Exam:    VS:  LMP 07/07/1983     Wt Readings from Last 3 Encounters:  09/17/23 174 lb 3.2 oz (79 kg)  03/16/23 177 lb 3.2 oz (80.4 kg)  09/02/22 188 lb 6.4 oz (85.5 kg)     GEN: in no acute distress HEENT: Normal NECK: No JVD; No carotid bruits CARDIAC: RRR, 2 out of 6 systolic heart murmur RESPIRATORY:  Clear to auscultation without rales, wheezing or rhonchi  ABDOMEN: Soft, non-tender, non-distended MUSCULOSKELETAL:  no edema in lower extremity SKIN: Warm and dry NEUROLOGIC:  Alert and oriented x 3 PSYCHIATRIC:  Normal affect   ASSESSMENT:    No diagnosis found.    PLAN:    CAD: Reported atypical chest pain.  Coronary CTA 03/23/2023 showed nonobstructive CAD with less than 25% stenosis in LAD and RCA, calcium  score 212 (56 percentile). - Continue atorvastatin  20 mg daily  Paroxysmal atrial fibrillation/atrial flutter: Zio patch x7 days on 11/10/2019 showed 1% atrial fibrillation/atrial flutter burden, with average heart rate 152 bpm and longest episode lasting 4 minutes.  She was started on Eliquis  5 mg twice daily and metoprolol .  Echocardiogram on 11/07/2019 showed normal biventricular function, no significant valvular disease.  CHA2DS2-VASc score 3 (hypertension, age x2). -Continue Eliquis  5 mg twice daily. Check  CBC -Continue metoprolol  50 mg twice daily -Sleep study showed borderline OSA overall but moderately severe REM related sleep apnea.  Patient electing to do oral appliance with Dr. Micky, encouraged to use  Systolic murmur: Due to aortic sclerosis, noted on echo 09/2022  Lower extremity edema: Mild LE edema.  Lower extremity duplex showed no DVT.  Normal BNP, albumin.  Suspect due to amlodipine  use -No edema on exam today  Hypertension: On amlodipine  5 mg daily and metoprolol  50 mg twice daily.  Appears controlled  Hyperlipidemia: LDL 121 on 02/13/2022.  Calcium  score 179 (54th percentile) in 04/2022.  Started on aotrvastatin 20 mg daily.  LDL 44 on 03/03/2023  OSA: treating with oral appliance from Dr. Micky as above, encouraged compliance   RTC in 6 months***  Medication Adjustments/Labs and Tests Ordered: Current medicines are reviewed at length with the patient today.  Concerns regarding medicines are outlined above.  No orders  of the defined types were placed in this encounter.   No orders of the defined types were placed in this encounter.    There are no Patient Instructions on file for this visit.     Signed, Lonni LITTIE Nanas, MD  03/28/2024 12:54 PM    [facility]

## 2024-03-30 ENCOUNTER — Ambulatory Visit: Admitting: Cardiology

## 2024-04-12 DIAGNOSIS — I130 Hypertensive heart and chronic kidney disease with heart failure and stage 1 through stage 4 chronic kidney disease, or unspecified chronic kidney disease: Secondary | ICD-10-CM | POA: Diagnosis present

## 2024-04-12 DIAGNOSIS — I517 Cardiomegaly: Secondary | ICD-10-CM | POA: Diagnosis not present

## 2024-04-12 DIAGNOSIS — E871 Hypo-osmolality and hyponatremia: Secondary | ICD-10-CM | POA: Diagnosis present

## 2024-04-12 DIAGNOSIS — I2510 Atherosclerotic heart disease of native coronary artery without angina pectoris: Secondary | ICD-10-CM | POA: Diagnosis present

## 2024-04-12 DIAGNOSIS — J918 Pleural effusion in other conditions classified elsewhere: Secondary | ICD-10-CM | POA: Diagnosis present

## 2024-04-12 DIAGNOSIS — R079 Chest pain, unspecified: Secondary | ICD-10-CM | POA: Diagnosis not present

## 2024-04-12 DIAGNOSIS — Z880 Allergy status to penicillin: Secondary | ICD-10-CM | POA: Diagnosis not present

## 2024-04-12 DIAGNOSIS — R0902 Hypoxemia: Secondary | ICD-10-CM | POA: Diagnosis not present

## 2024-04-12 DIAGNOSIS — J9612 Chronic respiratory failure with hypercapnia: Secondary | ICD-10-CM | POA: Diagnosis not present

## 2024-04-12 DIAGNOSIS — D509 Iron deficiency anemia, unspecified: Secondary | ICD-10-CM | POA: Diagnosis present

## 2024-04-12 DIAGNOSIS — E876 Hypokalemia: Secondary | ICD-10-CM | POA: Diagnosis not present

## 2024-04-12 DIAGNOSIS — J9811 Atelectasis: Secondary | ICD-10-CM | POA: Diagnosis present

## 2024-04-12 DIAGNOSIS — R600 Localized edema: Secondary | ICD-10-CM | POA: Diagnosis not present

## 2024-04-12 DIAGNOSIS — B9689 Other specified bacterial agents as the cause of diseases classified elsewhere: Secondary | ICD-10-CM | POA: Diagnosis present

## 2024-04-12 DIAGNOSIS — J189 Pneumonia, unspecified organism: Secondary | ICD-10-CM | POA: Diagnosis present

## 2024-04-12 DIAGNOSIS — I480 Paroxysmal atrial fibrillation: Secondary | ICD-10-CM | POA: Diagnosis not present

## 2024-04-12 DIAGNOSIS — J9622 Acute and chronic respiratory failure with hypercapnia: Secondary | ICD-10-CM | POA: Diagnosis present

## 2024-04-12 DIAGNOSIS — E039 Hypothyroidism, unspecified: Secondary | ICD-10-CM | POA: Diagnosis present

## 2024-04-12 DIAGNOSIS — F419 Anxiety disorder, unspecified: Secondary | ICD-10-CM | POA: Diagnosis present

## 2024-04-12 DIAGNOSIS — R918 Other nonspecific abnormal finding of lung field: Secondary | ICD-10-CM | POA: Diagnosis not present

## 2024-04-12 DIAGNOSIS — I959 Hypotension, unspecified: Secondary | ICD-10-CM | POA: Diagnosis not present

## 2024-04-12 DIAGNOSIS — I3139 Other pericardial effusion (noninflammatory): Secondary | ICD-10-CM | POA: Diagnosis present

## 2024-04-12 DIAGNOSIS — N1831 Chronic kidney disease, stage 3a: Secondary | ICD-10-CM | POA: Diagnosis present

## 2024-04-12 DIAGNOSIS — I4819 Other persistent atrial fibrillation: Secondary | ICD-10-CM | POA: Diagnosis present

## 2024-04-12 DIAGNOSIS — J9611 Chronic respiratory failure with hypoxia: Secondary | ICD-10-CM | POA: Diagnosis not present

## 2024-04-12 DIAGNOSIS — Z7901 Long term (current) use of anticoagulants: Secondary | ICD-10-CM | POA: Diagnosis not present

## 2024-04-12 DIAGNOSIS — Z853 Personal history of malignant neoplasm of breast: Secondary | ICD-10-CM | POA: Diagnosis not present

## 2024-04-12 DIAGNOSIS — J9621 Acute and chronic respiratory failure with hypoxia: Secondary | ICD-10-CM | POA: Diagnosis present

## 2024-04-12 DIAGNOSIS — J9601 Acute respiratory failure with hypoxia: Secondary | ICD-10-CM | POA: Diagnosis present

## 2024-04-12 DIAGNOSIS — R0602 Shortness of breath: Secondary | ICD-10-CM | POA: Diagnosis not present

## 2024-04-12 DIAGNOSIS — R7881 Bacteremia: Secondary | ICD-10-CM | POA: Diagnosis present

## 2024-04-12 DIAGNOSIS — I10 Essential (primary) hypertension: Secondary | ICD-10-CM | POA: Diagnosis not present

## 2024-04-12 DIAGNOSIS — Z882 Allergy status to sulfonamides status: Secondary | ICD-10-CM | POA: Diagnosis not present

## 2024-04-12 DIAGNOSIS — J90 Pleural effusion, not elsewhere classified: Secondary | ICD-10-CM | POA: Diagnosis not present

## 2024-04-12 DIAGNOSIS — I4891 Unspecified atrial fibrillation: Secondary | ICD-10-CM | POA: Diagnosis not present

## 2024-04-12 DIAGNOSIS — I5033 Acute on chronic diastolic (congestive) heart failure: Secondary | ICD-10-CM | POA: Diagnosis present

## 2024-04-18 DIAGNOSIS — J90 Pleural effusion, not elsewhere classified: Secondary | ICD-10-CM | POA: Diagnosis not present

## 2024-04-22 NOTE — Discharge Summary (Signed)
 [facility] Hospitalist  DISCHARGE SUMMARY Epic Chat Secure Text preferred   Admit Date: 04/12/2024   Discharge date: 04/23/2024 Code Status: Full Code  Patient's PCP: No Pcp Providers to follow up with:   PCP within 1 to 2 weeks of discharge-she has a PCP in [place], daughter attempting to get an appointment Cardiology within 2 weeks of discharge-patient follows with cardiologist in [facility] Patient will need repeat sleep study and arrangement for CPAP machine.  Hospital Course: Ms. Gadbois is a 23 years lady with history of paroxysmal A-fib(on eliquis ), hypertension, breast cancer, hypothyroidism who was brought to the [facility] on 04/12/2024 complaining of shortness of breath and palpitation.  She had been having coughing spells X 3 weeks, clear sputum. She and her family are currently staying in [place] for holidays, she is from [place].  Initially went to urgent care where they noted that her heart rate was not 140s after which she was sent here.A CTA of chest revealed no evidence of PE, small to moderate bilateral pleural effusions and a pericardial effusion was noted.   She had afib with rvr for which EP was consulted. Patient underwent cardioversion on 04/18/2024 but remained in A-fib. She was started on amiodarone drip then transitioned to oral amiodarone. She was also on metoprolol  50 mg TID, cardizem 60 mg QID. EP recommended consolidating diltiazem to extended release to 240 mg p.o. daily which has been done. Patient's persistent Afib might be contributed due to sleep apnea, patient is not on CPAP. She might benefit from CPAP machine.  She has bilateral pleural effusion and also some pericardial effusion with BNP is 249. Required IV lasix. Also had pericardial effusion without tamponade physiology and pleural effusion requiring right sided thoracentesis with removal of 500 ml fluid on 11/26. ECHO showed EF of 55-60%. She continued to improve. She required oxygen  during the whole hospitalization. Ambulatory pulse ox during discharge was done, she requires upto 2 L on ambulation. Patient is being discharged with oxygen. She was ready to be discharged on 04/22/24 but her blood pressure were on lower side due to which discharge was held. Lasix was discontinued. Her blood pressure is stable today. Medications and instructions discussed with patient.   Was this patient treated for decompensated heart failure during this admission? Yes. Dry weight: 174 lbs.  Discharge Diagnoses: Principal Problem (Resolved):   Acute hypoxic respiratory failure (HCC) Active Problems:   Persistent atrial fibrillation (HCC)   Anxiety   Hypothyroidism   Hypertension   Iron deficiency anemia   Stage 3a chronic kidney disease (HCC)   Sleep apnea in adult   Chronic respiratory failure with hypoxia and hypercapnia (CMS/HCC) (HCC) Resolved Problems:   Pneumonia   Acute on chronic diastolic heart failure (CMS/HCC) (HCC)   Pleural effusion   Pericardial effusion   Hyponatremia   Gram-positive bacteremia   Hypokalemia   Hypomagnesemia   Discharge Medications: Home Medications After Discharge  Scheduled  amiodarone (Pacerone) 200 MG tablet, Take 1 tablet by mouth 2 times a day.  amLODIPine  (Norvasc ) 5 MG tablet, Take 5 mg by mouth in the morning.  atorvastatin  (Lipitor) 20 MG tablet, Take 20 mg by mouth in the morning.  dilTIAZem CD (Cardizem CD) 240 MG 24 hr capsule, Take 1 capsule by mouth in the morning. Do not start before April 23, 2024.  Eliquis  5 MG tablet, Take 5 mg by mouth 2 times a day.  ferrous sulfate 325 (65 Fe) MG tablet, Take 325 mg by mouth in the  morning. Take with meals.  fish oil concentrate (Omega-3) 300 MG capsule, Take 300 mg by mouth in the morning.  levothyroxine (Synthroid, Levoxyl) 25 MCG tablet, Take 25 mcg by mouth in the morning. Take before meals.  metoprolol  tartrate (Lopressor ) 50 MG tablet, Take 1 tablet by mouth 3 times a  day.  PRN  ALPRAZolam (Xanax) 0.5 MG tablet, Take 0.5 mg by mouth daily as needed for anxiety.  furosemide (Lasix) 40 MG tablet, Take 1 tablet by mouth as needed (for lower extremity swelling or weight gain more than 3 lbs).  XR Chest AP View EXAM: 1 VIEW(S) XRAY OF THE CHEST 04/22/2024 08:19:18 AM  COMPARISON: 04/13/2024  CLINICAL HISTORY: Non improving oxygen requirement.  FINDINGS:  LUNGS AND PLEURA: Stable bibasilar airspace disease, either atelectasis, edema, or pneumonia. Stable small bilateral pleural effusions. No pneumothorax.   HEART AND MEDIASTINUM: Cardiomegaly.  BONES AND SOFT TISSUES: Degenerative changes of left glenohumeral joint. No acute osseous abnormality.   IMPRESSION: 1. Stable bibasilar airspace disease, possibly atelectasis, edema, or pneumonia. 2. Stable small bilateral pleural effusions. 3. Cardiomegaly.  Electronically signed by: Garnette Oman MD 04/22/2024 08:35 AM EST RP Workstation: [id number]    Discharge Physical Exam:   Vitals:   04/22/24 2100 04/22/24 2300 04/23/24 0400 04/23/24 0802  BP: 118/58 (!) 102/49 105/58 112/63  BP Location: Left arm Left arm Left arm Left arm  Patient Position: Sitting Sitting Sitting Lying  Pulse: (!) 111 (!) 115 89 99  Resp: 18 16 16 17   Temp: 36.2 C (97.2 F) 36.7 C (98.1 F) 36.2 C (97.2 F) 36.1 C (97 F)  TempSrc: Temporal Temporal Temporal Temporal  SpO2: 93% 94% 92% 93%  Weight:      Height:       I/O last 3 completed shifts: In: 240 (3 mL/kg) [I.V.:240 (3 mL/kg)] Out: 1 (0 mL/kg) [Urine:1 (0 mL/kg/hr)] Weight: 79.1 kg  No intake/output data recorded. Visit Vitals BP 112/63 (BP Location: Left arm, Patient Position: Lying)  Pulse 99  Temp 36.1 C (97 F) (Temporal)  Resp 17    Physical Exam Constitutional: Alert, awake, not in acute distress HEENT: atraumatic, normocephalic Chest: b/l equal breath sounds, no wheezing CVS: S1, S2 heard, no murmur/rubs/gallops  appreciated Abdomen: soft, non-tender, non distended, bowel sounds heard Extremities: no lower extremity edema, no skin changes Neuro: AAOx3, following commands   Pending Labs:  none   Spent 35 minutes discharging this patient from the hospital.  Allan Karst, MD

## 2024-04-23 NOTE — Nursing Note (Signed)
 Pt. Education and medications completed for dc. Sent home with home O2. Follow up appointments explained. No further concerns at this time. DC nurse to take pt. Down in wheelchair.

## 2024-04-27 NOTE — Progress Notes (Unsigned)
 Cardiology Office Note    Date:  04/29/2024  ID:  Carla Petersen, DOB [birth year], MRN [id number] PCP:  Ransom Other, MD  Cardiologist:  Lonni LITTIE Nanas, MD  Electrophysiologist:  None   Chief Complaint: Follow up for persistent atrial fibrillation  History of Present Illness: .   Carla Petersen is a 83 y.o. female with visit-pertinent history of Graves' disease, hyperlipidemia, prediabetes, breast cancer, HFpEF, persistent atrial fibrillation on Eliquis  and amiodarone.  Patient has been followed by Dr. Nanas, initially referred by her PCP for evaluation of palpitations.  She was initially seen on 10/14/2019.  She reported that she been having palpitations and a few weeks prior to her appointment described as heart racing.  Echocardiogram in 10/2019 showed normal biventricular function, no significant valvular disease.  Zio patch worn for 7 days in 10/2019 showed 1% atrial fibrillation/atrial flutter burden with an average heart rate of 152 bpm, longest episode lasting for 4 minutes.  She was started Eliquis  5 mg twice daily and metoprolol  25 mg twice daily.  Calcium  score 179, 54th percentile in 04/2022.  Echo in 09/2022 showed EF 6065%, G1 DD, normal RV function, aortic valve sclerosis.  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  Patient was last seen in clinic on 09/17/2023 by Dr. Nanas.  Patient reported she was doing okay, denied any chest pain.  She continued to have intermittent lower extremity edema, felt to be related to use of amlodipine .  On chart review on 04/12/2024 patient presented to [facility] in [place], [place] , was in the area for the holidays.  She presented the emergency room due to shortness of breath and palpitations, was noted be in atrial fibrillation with RVR with heart rates in the 140s.  Oxygen saturation was 83% in the prehospital setting, she was admitted  and started on IV diltiazem.  Chest x-ray demonstrated moderate right sided pleural effusion and small left-sided pleural effusion.  CT chest did not demonstrate any pulmonary emboli.  She was given IV Lasix as well as initiated on antibiotic therapy.  EP was consulted, patient underwent cardioversion on 04/18/2024 but remained in atrial fibrillation, was started on amiodarone drip then transition to oral amiodarone.  Patient had bilateral pleural effusions and pericardial effusion, BNP 249.  Patient was given IV Lasix, had right sided thoracentesis for pleural effusion with removal of 500 mL fluid on 11/26.  Echo per notes showed EF 55 to 60%.  Patient required oxygen during hospitalization, discharged on 2 L nasal cannula.  Patient was discharged on amiodarone 200 mg twice daily.  Echocardiogram on 11/26 indicated mild concentric LVH, LVEF 55 to 60%, wall motion assessment was limited, diastolic dysfunction was indeterminant, RV systolic function was normal, LA was severely dilated, RA was normal.  Noted to have mild mitral annular calcification, no evidence of mitral valve prolapse, mild mitral regurgitation, aortic valve appeared moderately sclerotic, mild aortic stenosis, noted to have a small pericardial effusion.  Today she presents for hospital follow-up.  She reports that she has been experiencing increased shortness of breath since her hospital discharge.  She denies any chest pain, notes that her legs and ankles have been progressively more swollen since her discharge and she has noted weight gain since her discharge.  She denies any palpitations, presyncope or syncope.  She notes that her activity tolerance has significantly declined as a result of increased dyspnea.  Also notices increased  cough when trying to lay down in bed, sleeping sitting up.  She has not been regularly taking Lasix since her discharge as she notes that her last day inpatient her blood pressure significantly dropped, was  instructed to take as needed.   Labwork independently reviewed: 04/14/24: TSH 1.785  04/23/2024: WBC 11.4, hemoglobin 12.5, hematocrit 38.3, potassium 3.2, creatinine 1.03, AST 31, ALT 22 ROS: .   Today she denies chest pain, melena, hematuria, hemoptysis, diaphoresis, presyncope, syncope.  All other systems are reviewed and otherwise negative. Studies Reviewed: SABRA   EKG:  EKG is ordered today, personally reviewed, demonstrating  EKG Interpretation Date/Time:  Friday April 29 2024 14:22:36 EST Ventricular Rate:  71 PR Interval:  152 QRS Duration:  90 QT Interval:  558 QTC Calculation: 606 R Axis:   79  Text Interpretation: Critical Test Result: Long QTc Normal sinus rhythm Prolonged QT When compared with ECG of 17-Sep-2023 16:07, Nonspecific T wave abnormality now evident in Inferior leads Nonspecific T wave abnormality, worse in Anterior leads QT has lengthened Confirmed by Kaedin Hicklin [phone]) on 04/29/2024 4:17:14 PM   CV Studies: Cardiac studies reviewed are outlined and summarized above. Otherwise please see EMR for full report. Cardiac Studies & Procedures   ______________________________________________________________________________________________     ECHOCARDIOGRAM  ECHOCARDIOGRAM COMPLETE 10/02/2022  Narrative ECHOCARDIOGRAM REPORT    Patient Name:   Carla Petersen Date of Exam: 10/02/2022 Medical Rec #:  [id number]      Height:       63.0 in Accession #:    [id number]     Weight:       188.4 lb Date of Birth:  [birth year]       BSA:          1.885 m Patient Age:    82 years       BP:           124/72 mmHg Patient Gender: F              HR:           72 bpm. Exam Location:  [address]  Procedure: 2D Echo, Cardiac Doppler and Color Doppler  Indications:    R01.1 Murmur  History:        Patient has prior history of Echocardiogram examinations, most recent 11/07/2019. Risk Factors:Hypertension, Dyslipidemia and Former Smoker. Palpitations. Prediabetes. Left  breast cancer. Radiation therapy.  Sonographer:    Jon Hacker RCS Referring Phys: [id number] CHRISTOPHER L SCHUMANN  IMPRESSIONS   1. Left ventricular ejection fraction, by estimation, is 60 to 65%. The left ventricle has normal function. The left ventricle has no regional wall motion abnormalities. Left ventricular diastolic parameters are consistent with Grade I diastolic dysfunction (impaired relaxation). 2. Right ventricular systolic function is normal. The right ventricular size is normal. 3. The mitral valve is normal in structure. Mild mitral valve regurgitation. No evidence of mitral stenosis. Moderate mitral annular calcification. 4. The aortic valve is normal in structure. There is mild calcification of the aortic valve. There is mild thickening of the aortic valve. Aortic valve regurgitation is not visualized. Aortic valve sclerosis is present, with no evidence of aortic valve stenosis. 5. The inferior vena cava is normal in size with greater than 50% respiratory variability, suggesting right atrial pressure of 3 mmHg.  Comparison(s): No significant change from prior study. Prior images reviewed side by side.  FINDINGS Left Ventricle: Left ventricular ejection fraction, by estimation, is 60 to 65%. The left ventricle has normal function. The  left ventricle has no regional wall motion abnormalities. The left ventricular internal cavity size was normal in size. There is no left ventricular hypertrophy. Left ventricular diastolic parameters are consistent with Grade I diastolic dysfunction (impaired relaxation).  Right Ventricle: The right ventricular size is normal. No increase in right ventricular wall thickness. Right ventricular systolic function is normal.  Left Atrium: Left atrial size was normal in size.  Right Atrium: Right atrial size was normal in size.  Pericardium: There is no evidence of pericardial effusion.  Mitral Valve: The mitral valve is normal in structure.  Moderate mitral annular calcification. Mild mitral valve regurgitation. No evidence of mitral valve stenosis.  Tricuspid Valve: The tricuspid valve is normal in structure. Tricuspid valve regurgitation is not demonstrated. No evidence of tricuspid stenosis.  Aortic Valve: The aortic valve is normal in structure. There is mild calcification of the aortic valve. There is mild thickening of the aortic valve. Aortic valve regurgitation is not visualized. Aortic valve sclerosis is present, with no evidence of aortic valve stenosis.  Pulmonic Valve: The pulmonic valve was normal in structure. Pulmonic valve regurgitation is not visualized. No evidence of pulmonic stenosis.  Aorta: The aortic root is normal in size and structure.  Venous: The inferior vena cava is normal in size with greater than 50% respiratory variability, suggesting right atrial pressure of 3 mmHg.  IAS/Shunts: No atrial level shunt detected by color flow Doppler.   LEFT VENTRICLE PLAX 2D LVIDd:         4.50 cm   Diastology LVIDs:         2.50 cm   LV e' medial:    6.20 cm/s LV PW:         1.00 cm   LV E/e' medial:  15.4 LV IVS:        1.00 cm   LV e' lateral:   6.53 cm/s LVOT diam:     1.90 cm   LV E/e' lateral: 14.7 LV SV:         91 LV SV Index:   48 LVOT Area:     2.84 cm   RIGHT VENTRICLE RV Basal diam:  3.30 cm RV S prime:     9.14 cm/s TAPSE (M-mode): 1.9 cm RVSP:           23.4 mmHg  LEFT ATRIUM             Index        RIGHT ATRIUM           Index LA diam:        3.70 cm 1.96 cm/m   RA Pressure: 3.00 mmHg LA Vol (A2C):   55.5 ml 29.44 ml/m  RA Area:     13.00 cm LA Vol (A4C):   54.9 ml 29.12 ml/m  RA Volume:   33.80 ml  17.93 ml/m LA Biplane Vol: 55.8 ml 29.60 ml/m AORTIC VALVE LVOT Vmax:   127.00 cm/s LVOT Vmean:  82.200 cm/s LVOT VTI:    0.320 m  AORTA Ao Root diam: 2.60 cm Ao Asc diam:  3.30 cm  MITRAL VALVE                TRICUSPID VALVE MV Area (PHT): 3.12 cm     TR Peak grad:   20.4  mmHg MV Decel Time: 243 msec     TR Vmax:        226.00 cm/s MV E velocity: 95.70 cm/s   Estimated RAP:  3.00 mmHg  MV A velocity: 137.00 cm/s  RVSP:           23.4 mmHg MV E/A ratio:  0.70 SHUNTS Systemic VTI:  0.32 m Systemic Diam: 1.90 cm  Oneil Parchment MD Electronically signed by Oneil Parchment MD Signature Date/Time: 10/02/2022/2:53:12 PM    Final    MONITORS  LONG TERM MONITOR (3-14 DAYS) 11/10/2019  Narrative  1% AF/AFL burden, with average HR 152 bpm and longest episode lasting 4 minutes.  7 days of data recorded on Zio monitor. Patient had a min HR of 55 bpm, max HR of 208 bpm, and avg HR of 152 bpm. Predominant underlying rhythm was Sinus Rhythm. No VT, high degree block, or pauses noted. 1 episode of SVT lasting 9 beats.  1% AF/AFL burden, with average HR 152 bpm and longest episode lasting 4 minutes.  Isolated atrial and ventricular ectopy was rare (<1%). There were 15 triggered events, which corresponded to AF/AFL, SVT, and sinus rhythm.   CT SCANS  CT CORONARY MORPH W/CTA COR W/SCORE 03/23/2023  Addendum 04/12/2023 11:36 PM ADDENDUM REPORT: 04/12/2023 23:33  EXAM: OVER-READ INTERPRETATION  CT CHEST  The following report is an over-read performed by radiologist Dr. Greig Ferraris [facility] on 04/12/2023. This over-read does not include interpretation of cardiac or coronary anatomy or pathology. The coronary CTA interpretation by the cardiologist is attached.  COMPARISON:  Cardiac CT 04/23/2022  FINDINGS: There is a stable linear scar in the right lung base. No acute findings in the lungs are lower mediastinum. The visualized upper abdomen and osseous structures are within normal limits.  IMPRESSION: No acute findings in the visualized extracardiac chest.   Electronically Signed By: Greig Pique M.D. On: 04/12/2023 23:33  Narrative CLINICAL DATA:  Chest pain  Chest pain  EXAM: Cardiac/Coronary CTA  TECHNIQUE: A non-contrast, gated  CT scan was obtained with axial slices of 3 mm through the heart for calcium  scoring. Calcium  scoring was performed using the Agatston method. A 120 kV prospective, gated, contrast cardiac scan was obtained. Gantry rotation speed was 250 msecs and collimation was 0.6 mm. Two sublingual nitroglycerin  tablets (0.8 mg) were given. The 3D data set was reconstructed in 5% intervals of the 35-75% of the R-R cycle. Diastolic phases were analyzed on a dedicated workstation using MPR, MIP, and VRT modes. The patient received 95 cc of contrast.  FINDINGS: Image quality: Excellent.  Noise artifact is: Limited.  Coronary Arteries:  Normal coronary origin.  Right dominance.  Left main: The left main is a large caliber vessel with a normal take off from the left coronary cusp that trifurcates to form a left anterior descending artery, ramus, and a left circumflex artery. There is minimal calcified plaque (<25%).  Left anterior descending artery: The proximal LAD contains minimal calcified plaque (<25%). The mid and distal segments are patent. Mid LAD myocardial bridge. The LAD gives off 2 patent diagonal branches.  Ramus intermedius: Patent with no evidence of plaque or stenosis.  Left circumflex artery: The LCX is non-dominant and patent with no evidence of plaque or stenosis. The LCX gives off 2 patent obtuse marginal branches.  Right coronary artery: The RCA is dominant with normal take off from the right coronary cusp. There is minimal mixed density plaque (<25%). The RCA terminates as a PDA and right posterolateral branch without evidence of plaque or stenosis.  Right Atrium: Right atrial size is within normal limits.  Right Ventricle: The right ventricular cavity is within normal limits.  Left Atrium:  Left atrial size is normal in size with no left atrial appendage filling defect. Small PFO.  Left Ventricle: The ventricular cavity size is within normal limits.  Pulmonary  arteries: Normal in size.  Pulmonary veins: Normal pulmonary venous drainage.  Pericardium: Normal thickness without significant effusion or calcium  present.  Cardiac valves: The aortic valve is trileaflet without significant calcification. The mitral valve is normal with mild annular calcification.  Aorta: Normal caliber without significant disease.  Extra-cardiac findings: See attached radiology report for non-cardiac structures.  IMPRESSION: 1. Coronary calcium  score of 212. This was 56th percentile for age-, sex, and race-matched controls.  2. Total plaque volume 147 mm3 which is 20th percentile for age- and sex-matched controls (calcified plaque 46 mm3; non-calcified plaque 101 mm3; low attenuation plaque 7 mm3). TPV is moderate.  3. Normal coronary origin with right dominance.  4. Minimal calcified plaque (<25%) in the LAD and minimal mixed density plaque in the RCA (<25%).  5. Mid LAD myocardial bridge (normal variant).  RECOMMENDATIONS: 1. CAD-RADS 1: Minimal non-obstructive CAD (0-24%). Consider non-atherosclerotic causes of chest pain. Consider preventive therapy and risk factor modification.  Darryle Decent, MD  Electronically Signed: By: Darryle Decent M.D. On: 03/23/2023 18:08   CT SCANS  CT CARDIAC SCORING (SELF PAY ONLY) 04/23/2022  Addendum 04/23/2022  4:39 PM ADDENDUM REPORT: 04/23/2022 16:37  EXAM: OVER-READ INTERPRETATION  CT CHEST  The following report is an over-read performed by radiologist Dr. Waddell Cola [facility] on 04/23/2022. This over-read does not include interpretation of cardiac or coronary anatomy or pathology. The coronary calcium  score interpretation by the cardiologist is attached.  COMPARISON:  None.  FINDINGS: Vascular: No acute abnormality.  Mediastinum/nodes: No mass or adenopathy identified.  Lungs/pleura: No pleural fluid or airspace disease. Scar like density identified within the posterior  right base and anterior right middle lobe. No suspicious pulmonary nodule or mass identified.  Upper Abdomen: No acute abnormality.  Musculoskeletal: Multiple remote healed left lateral rib fractures are also noted. No acute or suspicious osseous findings. Remote appearing mid body of sternum fracture.  IMPRESSION: 1. No acute extracardiac findings. 2. Multiple remote healed left lateral rib fractures and remote appearing mid body of sternum fracture. 3. Scar like density identified within the posterior right base and anterior right middle lobe. No suspicious pulmonary nodule or mass identified.   Electronically Signed By: Waddell Calk M.D. On: 04/23/2022 16:37  Narrative CLINICAL DATA:  Cardiovascular Disease Risk stratification  EXAM: Coronary Calcium  Score  TECHNIQUE: A gated, non-contrast computed tomography scan of the heart was performed using 3mm slice thickness. Axial images were analyzed on a dedicated workstation. Calcium  scoring of the coronary arteries was performed using the Agatston method.  FINDINGS: Coronary arteries: Normal origins.  Coronary Calcium  Score:  Left main: 97  Left anterior descending artery: 82  Left circumflex artery: 0  Right coronary artery: 0  Total: 179  Percentile: 54  Pericardium: Normal.  Ascending Aorta: Normal caliber.  Aortic atherosclerosis.  Mitral annular calcification.  Non-cardiac: See separate report from [facility].  IMPRESSION: 1. Coronary calcium  score of 179. This was 23 percentile for age-, race-, and sex-matched controls.  2.  Aortic atherosclerosis.  3.  Mitral annular calcification.  RECOMMENDATIONS: Coronary artery calcium  (CAC) score is a strong predictor of incident coronary heart disease (CHD) and provides predictive information beyond traditional risk factors. CAC scoring is reasonable to use in the decision to withhold, postpone, or initiate statin therapy in  intermediate-risk or selected borderline-risk  asymptomatic adults (age 48-75 years and LDL-C >=70 to <190 mg/dL) who do not have diabetes or established atherosclerotic cardiovascular disease (ASCVD).* In intermediate-risk (10-year ASCVD risk >=7.5% to <20%) adults or selected borderline-risk (10-year ASCVD risk >=5% to <7.5%) adults in whom a CAC score is measured for the purpose of making a treatment decision the following recommendations have been made:  If CAC=0, it is reasonable to withhold statin therapy and reassess in 5 to 10 years, as long as higher risk conditions are absent (diabetes mellitus, family history of premature CHD in first degree relatives (males <55 years; females <65 years), cigarette smoking, or LDL >=190 mg/dL).  If CAC is 1 to 99, it is reasonable to initiate statin therapy for patients >=19 years of age.  If CAC is >=100 or >=75th percentile, it is reasonable to initiate statin therapy at any age.  Cardiology referral should be considered for patients with CAC scores >=400 or >=75th percentile.  *2018 AHA/ACC/AACVPR/AAPA/ABC/ACPM/ADA/AGS/APhA/ASPC/NLA/PCNA Guideline on the Management of Blood Cholesterol: A Report of the American College of Cardiology/American Heart Association Task Force on Clinical Practice Guidelines. J Am Coll Cardiol. 2019;73(24):3168-3209.  Electronically Signed: By: Oneil Parchment M.D. On: 04/23/2022 16:16     ______________________________________________________________________________________________       Current Reported Medications:.    Current Meds  Medication Sig   ALPRAZolam (XANAX) 0.5 MG tablet Take 0.5 mg by mouth.   amiodarone (PACERONE) 200 MG tablet Take 200 mg by mouth 2 (two) times daily.   amLODipine  (NORVASC ) 5 MG tablet TAKE 1 TABLET BY MOUTH DAILY   atorvastatin  (LIPITOR) 20 MG tablet TAKE 1 TABLET BY MOUTH DAILY   calcium  carbonate (OS-CAL) 1250 (500 Ca) MG chewable tablet Chew by mouth.    diltiazem (CARDIZEM CD) 240 MG 24 hr capsule Take 240 mg by mouth daily.   ELIQUIS  5 MG TABS tablet TAKE 1 TABLET BY MOUTH 2 TIMES A DAY   ferrous sulfate 220 (44 FE) MG/5ML solution Take 65 mg by mouth daily.   furosemide (LASIX) 40 MG tablet Take 40 mg by mouth.   levothyroxine (SYNTHROID, LEVOTHROID) 25 MCG tablet Take 25 mcg by mouth daily.   metoprolol  tartrate (LOPRESSOR ) 50 MG tablet Take 50 mg by mouth 3 (three) times daily.   Multiple Vitamins-Minerals (PRESERVISION AREDS 2+MULTI VIT) CAPS    Nutritional Supplements (VITAMIN D MAINTENANCE PO) Take 800 Units by mouth daily.   Omega-3 Fatty Acids (FISH OIL CONCENTRATE) 300 MG CAPS 1 capsule   Vitamin D, Cholecalciferol, 25 MCG (1000 UT) CAPS 1 tablet    Physical Exam:    VS:  BP (!) 120/56   Pulse 71   Ht 5' 3 (1.6 m)   Wt 180 lb 6.4 oz (81.8 kg)   LMP 07/07/1983   SpO2 (!) 88% Comment: for couple seconds got to 90  BMI 31.96 kg/m    Wt Readings from Last 3 Encounters:  04/29/24 180 lb 6.4 oz (81.8 kg)  09/17/23 174 lb 3.2 oz (79 kg)  03/16/23 177 lb 3.2 oz (80.4 kg)   GEN: Well nourished NECK: unable to assess JVD; No carotid bruits CARDIAC: RRR, no murmurs, rubs, gallops RESPIRATORY:  Diminished bilateral, crackles left  ABDOMEN: Soft, non-tender, non-distended EXTREMITIES:  +3 bilateral lower extremity edema     Asessement and Plan:.    HFpEF/Pleural effusion: Patient admitted to outside hospital, discharged 1 week ago.  Patient had bilateral pleural effusions and pericardial effusion, BNP 249.  Patient was given IV Lasix, had right sided thoracentesis  for pleural effusion with removal of 500 mL fluid on 11/26. Echocardiogram on 11/26 indicated mild concentric LVH, LVEF 55 to 60%, wall motion assessment was limited, diastolic dysfunction was indeterminant, RV systolic function was normal, LA was severely dilated, RA was normal.  Noted to have mild mitral annular calcification, no evidence of mitral valve prolapse, mild  mitral regurgitation, aortic valve appeared moderately sclerotic, mild aortic stenosis, a small pericardial effusion.  Today she reports increased shortness of breath, has been consistently requiring oxygen since her hospital discharge.  Per her daughter was only to use on exertion.  Patient oxygen saturation not maintaining above 90% and appears volume overloaded, she has noted increased lower extremity edema as well as weight gain, has not been consistently taking Lasix given prior hypotension during hospitalization.  Discussed with Dr. Anner, DOD, patient to present to the emergency department for further evaluation.  Patient declined EMS transport.  Inpatient cardiology service and ED charge nurse notified.  Persistent atrial fibrillation/QT prolongation: Zio patch x7 days on 11/10/2019 showed 1% atrial fibrillation/atrial flutter burden, with average heart rate 152 bpm and longest episode lasting 4 minutes. She was started on Eliquis  5 mg twice daily and metoprolol . Patient recently admitted at [facility] with pleural effusions, found to have atrial fibrillation with RVR.  Underwent cardioversion however remained in atrial fibrillation, loaded on amiodarone. EKG today indicates normal sinus rhythm, QT prolonged, will need to discontinue amiodarone pending inpatient workup.  Patient denies any palpitations or feeling of atrial fibrillation.  She denies any bleeding problems on Eliquis .  CAD: Coronary CTA in 03/2023 showed nonobstructive CAD with less than 25% stenosis in LAD and RCA, calcium  score 212, 56 percentile. Patient denies chest pain, notes increased shortness of breath as noted above. Patient to present to the ED for further evaluation.    Hypertension: Blood pressure today 120/56.    Disposition: Patient to present to the ED for further evaluation. F/u with Dr. Kate or Kiing Deakin, NP in two weeks.  Signed, Nakea Gouger D Kendel Pesnell, NP

## 2024-04-29 ENCOUNTER — Emergency Department (HOSPITAL_COMMUNITY)

## 2024-04-29 ENCOUNTER — Inpatient Hospital Stay (HOSPITAL_COMMUNITY)
Admission: EM | Admit: 2024-04-29 | Discharge: 2024-05-14 | DRG: 291 | Disposition: A | Discharge status: Home / Self Care | Source: Ambulatory Visit | Attending: Internal Medicine | Admitting: Internal Medicine

## 2024-04-29 ENCOUNTER — Encounter (HOSPITAL_COMMUNITY): Payer: Self-pay

## 2024-04-29 ENCOUNTER — Ambulatory Visit (INDEPENDENT_AMBULATORY_CARE_PROVIDER_SITE_OTHER): Admitting: Cardiology

## 2024-04-29 ENCOUNTER — Other Ambulatory Visit: Payer: Self-pay

## 2024-04-29 ENCOUNTER — Encounter: Payer: Self-pay | Admitting: Cardiology

## 2024-04-29 VITALS — BP 120/56 | HR 71 | Ht 63.0 in | Wt 180.4 lb

## 2024-04-29 DIAGNOSIS — Z7901 Long term (current) use of anticoagulants: Secondary | ICD-10-CM

## 2024-04-29 DIAGNOSIS — R0602 Shortness of breath: Secondary | ICD-10-CM

## 2024-04-29 DIAGNOSIS — N179 Acute kidney failure, unspecified: Secondary | ICD-10-CM | POA: Diagnosis present

## 2024-04-29 DIAGNOSIS — I4891 Unspecified atrial fibrillation: Secondary | ICD-10-CM | POA: Insufficient documentation

## 2024-04-29 DIAGNOSIS — I5031 Acute diastolic (congestive) heart failure: Secondary | ICD-10-CM | POA: Diagnosis not present

## 2024-04-29 DIAGNOSIS — E876 Hypokalemia: Secondary | ICD-10-CM | POA: Diagnosis not present

## 2024-04-29 DIAGNOSIS — Z91040 Latex allergy status: Secondary | ICD-10-CM | POA: Diagnosis not present

## 2024-04-29 DIAGNOSIS — Y848 Other medical procedures as the cause of abnormal reaction of the patient, or of later complication, without mention of misadventure at the time of the procedure: Secondary | ICD-10-CM | POA: Diagnosis not present

## 2024-04-29 DIAGNOSIS — J9811 Atelectasis: Secondary | ICD-10-CM | POA: Diagnosis not present

## 2024-04-29 DIAGNOSIS — I10 Essential (primary) hypertension: Secondary | ICD-10-CM

## 2024-04-29 DIAGNOSIS — I3139 Other pericardial effusion (noninflammatory): Secondary | ICD-10-CM | POA: Diagnosis not present

## 2024-04-29 DIAGNOSIS — G4733 Obstructive sleep apnea (adult) (pediatric): Secondary | ICD-10-CM

## 2024-04-29 DIAGNOSIS — I4892 Unspecified atrial flutter: Secondary | ICD-10-CM | POA: Diagnosis present

## 2024-04-29 DIAGNOSIS — I2510 Atherosclerotic heart disease of native coronary artery without angina pectoris: Secondary | ICD-10-CM | POA: Diagnosis present

## 2024-04-29 DIAGNOSIS — I110 Hypertensive heart disease with heart failure: Principal | ICD-10-CM | POA: Diagnosis present

## 2024-04-29 DIAGNOSIS — J918 Pleural effusion in other conditions classified elsewhere: Secondary | ICD-10-CM | POA: Diagnosis present

## 2024-04-29 DIAGNOSIS — I340 Nonrheumatic mitral (valve) insufficiency: Secondary | ICD-10-CM | POA: Diagnosis not present

## 2024-04-29 DIAGNOSIS — I4819 Other persistent atrial fibrillation: Secondary | ICD-10-CM | POA: Diagnosis present

## 2024-04-29 DIAGNOSIS — E0500 Thyrotoxicosis with diffuse goiter without thyrotoxic crisis or storm: Secondary | ICD-10-CM | POA: Diagnosis present

## 2024-04-29 DIAGNOSIS — Z9289 Personal history of other medical treatment: Secondary | ICD-10-CM | POA: Diagnosis not present

## 2024-04-29 DIAGNOSIS — R918 Other nonspecific abnormal finding of lung field: Secondary | ICD-10-CM | POA: Diagnosis not present

## 2024-04-29 DIAGNOSIS — R0902 Hypoxemia: Secondary | ICD-10-CM

## 2024-04-29 DIAGNOSIS — Z79890 Hormone replacement therapy (postmenopausal): Secondary | ICD-10-CM

## 2024-04-29 DIAGNOSIS — Z923 Personal history of irradiation: Secondary | ICD-10-CM

## 2024-04-29 DIAGNOSIS — I5033 Acute on chronic diastolic (congestive) heart failure: Secondary | ICD-10-CM | POA: Diagnosis present

## 2024-04-29 DIAGNOSIS — L89321 Pressure ulcer of left buttock, stage 1: Secondary | ICD-10-CM | POA: Diagnosis present

## 2024-04-29 DIAGNOSIS — E039 Hypothyroidism, unspecified: Secondary | ICD-10-CM | POA: Diagnosis present

## 2024-04-29 DIAGNOSIS — Z1152 Encounter for screening for COVID-19: Secondary | ICD-10-CM

## 2024-04-29 DIAGNOSIS — I350 Nonrheumatic aortic (valve) stenosis: Secondary | ICD-10-CM | POA: Diagnosis not present

## 2024-04-29 DIAGNOSIS — I5023 Acute on chronic systolic (congestive) heart failure: Secondary | ICD-10-CM

## 2024-04-29 DIAGNOSIS — E8770 Fluid overload, unspecified: Secondary | ICD-10-CM

## 2024-04-29 DIAGNOSIS — Z87891 Personal history of nicotine dependence: Secondary | ICD-10-CM

## 2024-04-29 DIAGNOSIS — R600 Localized edema: Secondary | ICD-10-CM

## 2024-04-29 DIAGNOSIS — Z79899 Other long term (current) drug therapy: Secondary | ICD-10-CM

## 2024-04-29 DIAGNOSIS — J90 Pleural effusion, not elsewhere classified: Secondary | ICD-10-CM | POA: Diagnosis not present

## 2024-04-29 DIAGNOSIS — I080 Rheumatic disorders of both mitral and aortic valves: Secondary | ICD-10-CM | POA: Diagnosis present

## 2024-04-29 DIAGNOSIS — E782 Mixed hyperlipidemia: Secondary | ICD-10-CM | POA: Diagnosis present

## 2024-04-29 DIAGNOSIS — T502X5A Adverse effect of carbonic-anhydrase inhibitors, benzothiadiazides and other diuretics, initial encounter: Secondary | ICD-10-CM | POA: Diagnosis not present

## 2024-04-29 DIAGNOSIS — Z882 Allergy status to sulfonamides status: Secondary | ICD-10-CM

## 2024-04-29 DIAGNOSIS — R9431 Abnormal electrocardiogram [ECG] [EKG]: Secondary | ICD-10-CM

## 2024-04-29 DIAGNOSIS — R059 Cough, unspecified: Secondary | ICD-10-CM | POA: Diagnosis not present

## 2024-04-29 DIAGNOSIS — J209 Acute bronchitis, unspecified: Secondary | ICD-10-CM | POA: Diagnosis present

## 2024-04-29 DIAGNOSIS — Z9012 Acquired absence of left breast and nipple: Secondary | ICD-10-CM | POA: Diagnosis not present

## 2024-04-29 DIAGNOSIS — T80818A Extravasation of other vesicant agent, initial encounter: Secondary | ICD-10-CM | POA: Diagnosis not present

## 2024-04-29 DIAGNOSIS — I5021 Acute systolic (congestive) heart failure: Secondary | ICD-10-CM | POA: Diagnosis not present

## 2024-04-29 DIAGNOSIS — Z853 Personal history of malignant neoplasm of breast: Secondary | ICD-10-CM

## 2024-04-29 DIAGNOSIS — L89311 Pressure ulcer of right buttock, stage 1: Secondary | ICD-10-CM | POA: Diagnosis present

## 2024-04-29 DIAGNOSIS — Z48813 Encounter for surgical aftercare following surgery on the respiratory system: Secondary | ICD-10-CM | POA: Diagnosis not present

## 2024-04-29 DIAGNOSIS — I509 Heart failure, unspecified: Secondary | ICD-10-CM

## 2024-04-29 DIAGNOSIS — I959 Hypotension, unspecified: Secondary | ICD-10-CM | POA: Diagnosis present

## 2024-04-29 DIAGNOSIS — I808 Phlebitis and thrombophlebitis of other sites: Secondary | ICD-10-CM | POA: Diagnosis not present

## 2024-04-29 DIAGNOSIS — Z91048 Other nonmedicinal substance allergy status: Secondary | ICD-10-CM

## 2024-04-29 DIAGNOSIS — I517 Cardiomegaly: Secondary | ICD-10-CM | POA: Diagnosis not present

## 2024-04-29 DIAGNOSIS — Z880 Allergy status to penicillin: Secondary | ICD-10-CM

## 2024-04-29 DIAGNOSIS — L8990 Pressure ulcer of unspecified site, unspecified stage: Secondary | ICD-10-CM | POA: Insufficient documentation

## 2024-04-29 DIAGNOSIS — J9601 Acute respiratory failure with hypoxia: Secondary | ICD-10-CM | POA: Diagnosis present

## 2024-04-29 HISTORY — DX: Heart failure, unspecified: I50.9

## 2024-04-29 LAB — I-STAT CHEM 8, ED
BUN: 26 mg/dL — ABNORMAL HIGH (ref 8–23)
Calcium, Ion: 1.05 mmol/L — ABNORMAL LOW (ref 1.15–1.40)
Chloride: 94 mmol/L — ABNORMAL LOW (ref 98–111)
Creatinine, Ser: 1.3 mg/dL — ABNORMAL HIGH (ref 0.44–1.00)
Glucose, Bld: 120 mg/dL — ABNORMAL HIGH (ref 70–99)
HCT: 43 % (ref 36.0–46.0)
Hemoglobin: 14.6 g/dL (ref 12.0–15.0)
Potassium: 2.7 mmol/L — CL (ref 3.5–5.1)
Sodium: 142 mmol/L (ref 135–145)
TCO2: 36 mmol/L — ABNORMAL HIGH (ref 22–32)

## 2024-04-29 LAB — I-STAT CG4 LACTIC ACID, ED: Lactic Acid, Venous: 1.7 mmol/L (ref 0.5–1.9)

## 2024-04-29 LAB — I-STAT VENOUS BLOOD GAS, ED
Acid-Base Excess: 17 mmol/L — ABNORMAL HIGH (ref 0.0–2.0)
Bicarbonate: 42.8 mmol/L — ABNORMAL HIGH (ref 20.0–28.0)
Calcium, Ion: 1.05 mmol/L — ABNORMAL LOW (ref 1.15–1.40)
HCT: 42 % (ref 36.0–46.0)
Hemoglobin: 14.3 g/dL (ref 12.0–15.0)
O2 Saturation: 71 %
Potassium: 2.7 mmol/L — CL (ref 3.5–5.1)
Sodium: 140 mmol/L (ref 135–145)
TCO2: 44 mmol/L — ABNORMAL HIGH (ref 22–32)
pCO2, Ven: 52.6 mmHg (ref 44–60)
pH, Ven: 7.518 — ABNORMAL HIGH (ref 7.25–7.43)
pO2, Ven: 35 mmHg (ref 32–45)

## 2024-04-29 LAB — BASIC METABOLIC PANEL WITH GFR
Anion gap: 11 (ref 5–15)
Anion gap: 11 (ref 5–15)
BUN: 23 mg/dL (ref 8–23)
BUN: 22 mg/dL (ref 8–23)
CO2: 35 mmol/L — ABNORMAL HIGH (ref 22–32)
CO2: 34 mmol/L — ABNORMAL HIGH (ref 22–32)
Calcium: 8.9 mg/dL (ref 8.9–10.3)
Calcium: 8.6 mg/dL — ABNORMAL LOW (ref 8.9–10.3)
Chloride: 93 mmol/L — ABNORMAL LOW (ref 98–111)
Chloride: 95 mmol/L — ABNORMAL LOW (ref 98–111)
Creatinine, Ser: 1.24 mg/dL — ABNORMAL HIGH (ref 0.44–1.00)
Creatinine, Ser: 1.13 mg/dL — ABNORMAL HIGH (ref 0.44–1.00)
GFR, Estimated: 43 mL/min — ABNORMAL LOW (ref >60)
GFR, Estimated: 48 mL/min — ABNORMAL LOW (ref >60)
Glucose, Bld: 119 mg/dL — ABNORMAL HIGH (ref 70–99)
Glucose, Bld: 112 mg/dL — ABNORMAL HIGH (ref 70–99)
Potassium: 3.3 mmol/L — ABNORMAL LOW (ref 3.5–5.1)
Potassium: 3.7 mmol/L (ref 3.5–5.1)
Sodium: 139 mmol/L (ref 135–145)
Sodium: 140 mmol/L (ref 135–145)

## 2024-04-29 LAB — CBC
HCT: 42.4 % (ref 36.0–46.0)
Hemoglobin: 13.6 g/dL (ref 12.0–15.0)
MCH: 30.9 pg (ref 26.0–34.0)
MCHC: 32.1 g/dL (ref 30.0–36.0)
MCV: 96.4 fL (ref 80.0–100.0)
Platelets: 234 K/uL (ref 150–400)
RBC: 4.4 MIL/uL (ref 3.87–5.11)
RDW: 14.3 % (ref 11.5–15.5)
WBC: 12.5 K/uL — ABNORMAL HIGH (ref 4.0–10.5)
nRBC: 0 % (ref 0.0–0.2)

## 2024-04-29 LAB — MAGNESIUM: Magnesium: 2.6 mg/dL — ABNORMAL HIGH (ref 1.7–2.4)

## 2024-04-29 LAB — BRAIN NATRIURETIC PEPTIDE: B Natriuretic Peptide: 584.1 pg/mL — ABNORMAL HIGH (ref 0.0–100.0)

## 2024-04-29 LAB — TROPONIN I (HIGH SENSITIVITY)
Troponin I (High Sensitivity): 7 ng/L (ref <18)
Troponin I (High Sensitivity): 8 ng/L (ref <18)

## 2024-04-29 MED ORDER — LEVOTHYROXINE SODIUM 25 MCG PO TABS
25.0000 ug | ORAL_TABLET | Freq: Every day | ORAL | Status: DC
  Administered 2024-04-30 – 2024-05-14 (×15): 25 ug via ORAL
  Filled 2024-04-29 (×15): qty 1

## 2024-04-29 MED ORDER — ACETAMINOPHEN 650 MG RE SUPP: 650.0000 mg | Freq: Four times a day (QID) | RECTAL | Status: DC | PRN

## 2024-04-29 MED ORDER — ATORVASTATIN CALCIUM 10 MG PO TABS
20.0000 mg | ORAL_TABLET | Freq: Every day | ORAL | Status: DC
  Administered 2024-04-30 – 2024-05-14 (×15): 20 mg via ORAL
  Filled 2024-04-29 (×15): qty 2

## 2024-04-29 MED ORDER — ONDANSETRON HCL 4 MG PO TABS: 4.0000 mg | ORAL_TABLET | Freq: Four times a day (QID) | ORAL | Status: DC | PRN

## 2024-04-29 MED ORDER — ACETAMINOPHEN 325 MG PO TABS
650.0000 mg | ORAL_TABLET | Freq: Four times a day (QID) | ORAL | Status: DC | PRN
  Filled 2024-04-29: qty 2

## 2024-04-29 MED ORDER — POTASSIUM CHLORIDE CRYS ER 20 MEQ PO TBCR
40.0000 meq | EXTENDED_RELEASE_TABLET | Freq: Once | ORAL | Status: AC
  Administered 2024-04-29: 40 meq via ORAL
  Filled 2024-04-29: qty 2

## 2024-04-29 MED ORDER — AMIODARONE HCL 200 MG PO TABS
200.0000 mg | ORAL_TABLET | Freq: Two times a day (BID) | ORAL | Status: DC
  Administered 2024-04-29: 200 mg via ORAL
  Filled 2024-04-29: qty 1

## 2024-04-29 MED ORDER — POTASSIUM CHLORIDE 10 MEQ/100ML IV SOLN
10.0000 meq | INTRAVENOUS | Status: AC
  Administered 2024-04-29 (×3): 10 meq via INTRAVENOUS
  Filled 2024-04-29: qty 100

## 2024-04-29 MED ORDER — POTASSIUM CHLORIDE CRYS ER 20 MEQ PO TBCR
40.0000 meq | EXTENDED_RELEASE_TABLET | Freq: Once | ORAL | Status: AC
  Administered 2024-04-29: 40 meq via ORAL
  Filled 2024-04-29 (×2): qty 2

## 2024-04-29 MED ORDER — FUROSEMIDE 10 MG/ML IJ SOLN
2.0000 mg/h | INTRAVENOUS | Status: DC
  Administered 2024-04-29: 18:00:00 2 mg/h via INTRAVENOUS
  Filled 2024-04-29: qty 200

## 2024-04-29 MED ORDER — APIXABAN 5 MG PO TABS
5.0000 mg | ORAL_TABLET | Freq: Two times a day (BID) | ORAL | Status: DC
  Administered 2024-04-29 – 2024-04-30 (×3): 5 mg via ORAL
  Filled 2024-04-29 (×3): qty 1

## 2024-04-29 MED ORDER — MAGNESIUM SULFATE 2 GM/50ML IV SOLN
2.0000 g | Freq: Once | INTRAVENOUS | Status: AC
  Administered 2024-04-29: 2 g via INTRAVENOUS
  Filled 2024-04-29: qty 50

## 2024-04-29 MED ORDER — ONDANSETRON HCL 4 MG/2ML IJ SOLN: 4.0000 mg | Freq: Four times a day (QID) | INTRAMUSCULAR | Status: DC | PRN

## 2024-04-29 NOTE — ED Notes (Signed)
Not in room, pt in CT

## 2024-04-29 NOTE — ED Provider Notes (Cosign Needed)
  EMERGENCY DEPARTMENT AT [facility] Provider Note   CSN: [id number] Arrival date & time: 04/29/24  1523     Patient presents with: Shortness of Breath and Leg Swelling   Carla Petersen is a 83 y.o. female. Hx of paroxysmal A-fib(on eliquis ), hypertension, breast cancer, hypothyroidism, Grave's disease, HLD, prediabetes presenting with SOB from cardiology office.  Since discharge from the hospital, experiencing increasing shortness of breath.  Denies chest pain, notes her legs and ankles feel much more swollen, and she has noted significant weight gain since her discharge.  Denies palpitations, syncope, presyncope.  Endorses her activity tolerance is reduced, increased cough.  Primarily exacerbated when trying to lay down in bed.  Reports that she has to sleep sitting up now.  States that she has not been taking her Lasix since discharge, was instructed to take as needed secondary to reduced BP.  O2 sat 88 to 90%, reduced to low 80s with short ambulation with cardiology service.  Diminished lung sounds, crackles, and significant lower extremity edema.  Recommend to go to the ED for evaluation, likely admission for diuresis with associated lower BP.  Continues to endorse worsening shortness of breath, continues to endorse a productive cough.  Denies any fever or chills.  Currently on 4 L O2 [place].  83% on room air on arrival to the ED.  Per cards note: Patient has been followed by Dr. Kate, initially referred by her PCP for evaluation of palpitations.  She was initially seen on 10/14/2019.  She reported that she been having palpitations and a few weeks prior to her appointment described as heart racing.  Echocardiogram in 10/2019 showed normal biventricular function, no significant valvular disease.  Zio patch worn for 7 days in 10/2019 showed 1% atrial fibrillation/atrial flutter burden with an average heart rate of 152 bpm, longest episode lasting for 4 minutes.  She was started  Eliquis  5 mg twice daily and metoprolol  25 mg twice daily.  Calcium  score 179, 54th percentile in 04/2022.  Echo in 09/2022 showed EF 6065%, G1 DD, normal RV function, aortic valve sclerosis.  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  Echocardiogram on 11/26 indicated mild concentric LVH, LVEF 55 to 60%, wall motion assessment was limited, diastolic dysfunction was indeterminant, RV systolic function was normal, LA was severely dilated, RA was normal. Noted to have mild mitral annular calcification, no evidence of mitral valve prolapse, mild mitral regurgitation, aortic valve appeared moderately sclerotic, mild aortic stenosis, noted to have a small pericardial effusion.  On chart review on 04/12/2024 patient presented to [facility] in [place], [place] , was in the area for the holidays.  She presented the emergency room due to shortness of breath and palpitations, was noted be in atrial fibrillation with RVR with heart rates in the 140s.  Oxygen saturation was 83% in the prehospital setting, she was admitted and started on IV diltiazem.  Chest x-ray demonstrated moderate right sided pleural effusion and small left-sided pleural effusion.  CT chest did not demonstrate any pulmonary emboli.  She was given IV Lasix as well as initiated on antibiotic therapy.  EP was consulted, patient underwent cardioversion on 04/18/2024 but remained in atrial fibrillation, was started on amiodarone drip then transition to oral amiodarone.  Patient had bilateral pleural effusions and pericardial effusion, BNP 249.  Patient was given IV Lasix, had right sided thoracentesis for pleural effusion with removal of  500 mL fluid on 11/26.  Echo per notes showed EF 55 to 60%.  Patient required oxygen during hospitalization, discharged on 2 L nasal cannula.  Patient was discharged on amiodarone 200 mg twice daily.     Shortness of  Breath      Prior to Admission medications  Medication Sig Start Date End Date Taking? Authorizing Provider  acetaminophen (TYLENOL) 500 MG tablet Take 500 mg by mouth daily as needed. PRN 08/04/14  Yes [provider]  ALPRAZolam (XANAX) 0.5 MG tablet Take 0.5 mg by mouth daily as needed. 01/17/16  Yes [provider]  amiodarone (PACERONE) 200 MG tablet Take 200 mg by mouth 2 (two) times daily. 04/22/24 05/19/24 Yes [provider]  amLODipine  (NORVASC ) 5 MG tablet TAKE 1 TABLET BY MOUTH DAILY 09/29/23  Yes Kate Lonni CROME, MD  atorvastatin  (LIPITOR) 20 MG tablet TAKE 1 TABLET BY MOUTH DAILY 07/03/23  Yes Kate Lonni CROME, MD  calcium  carbonate (OS-CAL) 1250 (500 Ca) MG chewable tablet Chew 1 tablet by mouth daily.   Yes [provider]  diltiazem (CARDIZEM CD) 240 MG 24 hr capsule Take 240 mg by mouth daily.   Yes [provider]  ELIQUIS  5 MG TABS tablet TAKE 1 TABLET BY MOUTH 2 TIMES A DAY 11/16/23  Yes Kate Lonni CROME, MD  ferrous sulfate 325 (65 FE) MG EC tablet Take 325 mg by mouth daily with breakfast.   Yes [provider]  furosemide (LASIX) 40 MG tablet Take 40 mg by mouth daily as needed. 04/22/24 05/22/24 Yes [provider]  levothyroxine (SYNTHROID, LEVOTHROID) 25 MCG tablet Take 25 mcg by mouth daily.   Yes [provider]  metoprolol  tartrate (LOPRESSOR ) 50 MG tablet Take 50 mg by mouth 3 (three) times daily. 04/22/24 05/22/24 Yes [provider]  Multiple Vitamins-Minerals (PRESERVISION AREDS 2+MULTI VIT) CAPS Take 1 capsule by mouth daily.   Yes [provider]  Nutritional Supplements (VITAMIN D MAINTENANCE PO) Take 800 Units by mouth daily.   Yes [provider]  Omega-3 Fatty Acids (FISH OIL CONCENTRATE) 300 MG CAPS Take 1 capsule by mouth daily.   Yes [provider]  Vitamin D, Cholecalciferol, 25 MCG (1000 UT) CAPS Take 1 capsule by mouth daily.   Yes  [provider]    Allergies: Latex, Penicillins, Sulfa antibiotics, and Pedi-pre tape spray [wound dressing adhesive]    Review of Systems  Respiratory:  Positive for shortness of breath.     Updated Vital Signs BP 116/73   Pulse 98   Temp 97.7 F (36.5 C) (Oral)   Resp (!) 27   LMP 07/07/1983   SpO2 92%   Physical Exam Vitals and nursing note reviewed.  Constitutional:      General: She is not in acute distress.    Appearance: She is well-developed. She is ill-appearing.  HENT:     Head: Normocephalic and atraumatic.  Eyes:     Conjunctiva/sclera: Conjunctivae normal.  Cardiovascular:     Rate and Rhythm: Normal rate and regular rhythm.     Pulses: Normal pulses.     Heart sounds: Normal heart sounds. No murmur heard. Pulmonary:     Effort: Pulmonary effort is normal. No tachypnea or respiratory distress.     Breath sounds: Examination of the right-upper field reveals decreased breath sounds. Examination of the left-upper field reveals decreased breath sounds. Examination of the right-middle field reveals decreased breath sounds. Examination of the left-middle field reveals decreased breath sounds.  Examination of the right-lower field reveals decreased breath sounds. Examination of the left-lower field reveals decreased breath sounds. Decreased breath sounds present. No wheezing, rhonchi or rales.  Abdominal:     Palpations: Abdomen is soft.     Tenderness: There is no abdominal tenderness.  Musculoskeletal:        General: No swelling.     Cervical back: Neck supple.     Right lower leg: Edema present.     Left lower leg: Edema present.     Comments: 4+ pitting edema bilateral lower extremities  Skin:    General: Skin is warm and dry.     Capillary Refill: Capillary refill takes 2 to 3 seconds.  Neurological:     General: No focal deficit present.     Mental Status: She is alert and oriented to person, place, and time.  Psychiatric:        Mood and  Affect: Mood normal.     (all labs ordered are listed, but only abnormal results are displayed) Labs Reviewed  BASIC METABOLIC PANEL WITH GFR - Abnormal; Notable for the following components:      Result Value   Potassium 3.3 (*)    Chloride 93 (*)    CO2 35 (*)    Glucose, Bld 119 (*)    Creatinine, Ser 1.24 (*)    GFR, Estimated 43 (*)    All other components within normal limits  CBC - Abnormal; Notable for the following components:   WBC 12.5 (*)    All other components within normal limits  BRAIN NATRIURETIC PEPTIDE - Abnormal; Notable for the following components:   B Natriuretic Peptide 584.1 (*)    All other components within normal limits  MAGNESIUM - Abnormal; Notable for the following components:   Magnesium 2.6 (*)    All other components within normal limits  BASIC METABOLIC PANEL WITH GFR - Abnormal; Notable for the following components:   Chloride 95 (*)    CO2 34 (*)    Glucose, Bld 112 (*)    Creatinine, Ser 1.13 (*)    Calcium  8.6 (*)    GFR, Estimated 48 (*)    All other components within normal limits  I-STAT CHEM 8, ED - Abnormal; Notable for the following components:   Potassium 2.7 (*)    Chloride 94 (*)    BUN 26 (*)    Creatinine, Ser 1.30 (*)    Glucose, Bld 120 (*)    Calcium , Ion 1.05 (*)    TCO2 36 (*)    All other components within normal limits  I-STAT VENOUS BLOOD GAS, ED - Abnormal; Notable for the following components:   pH, Ven 7.518 (*)    Bicarbonate 42.8 (*)    TCO2 44 (*)    Acid-Base Excess 17.0 (*)    Potassium 2.7 (*)    Calcium , Ion 1.05 (*)    All other components within normal limits  BASIC METABOLIC PANEL WITH GFR  CBC  PROTIME-INR  MAGNESIUM  I-STAT CG4 LACTIC ACID, ED  TROPONIN I (HIGH SENSITIVITY)  TROPONIN I (HIGH SENSITIVITY)    EKG: None  Radiology: CT CHEST WO CONTRAST Result Date: 04/29/2024 EXAM: CT CHEST WITHOUT CONTRAST 04/29/2024 07:00:00 PM TECHNIQUE: CT of the chest was performed without the  administration of intravenous contrast. Multiplanar reformatted images are provided for review. Automated exposure control, iterative reconstruction, and/or weight based adjustment of the mA/kV was utilized to reduce the radiation dose to as low as reasonably achievable.  COMPARISON: None available. CLINICAL HISTORY: Dyspnea, chronic, chest wall or pleura disease suspected. FINDINGS: MEDIASTINUM: Mild cardiomegaly with small pericardial effusion. Scattered coronary calcifications. The central airways are clear. Scattered calcified plaque in descending thoracic aorta and arch. Dilated central pulmonary arteries suggesting pulmonary hypertension. LYMPH NODES: Mildly prominent mediastinal nodes, likely reactive. No hilar or axillary lymphadenopathy. LUNGS AND PLEURA: Moderate bilateral pleural effusions. Associated lingular and bilateral lower lobe compressive atelectasis. Patchy ground-glass opacities in right upper lobe, favoring infection/inflammation over mild edema. Coarse interstitial markings in periphery of both lung bases, suggesting mild interstitial lung disease. No pneumothorax. SOFT TISSUES/BONES: Bilateral shoulder DJD (degenerative joint disease). No acute abnormality of the bones or soft tissues. UPPER ABDOMEN: Small volume perihepatic ascites. Cirrhosis. IMPRESSION: 1. Patchy ground-glass opacities in the right upper lobe, favoring infection/inflammation over mild edema. 2. Moderate bilateral pleural effusions with associated lingular and bilateral lower lobe compressive atelectasis. 3. Mild cardiomegaly with small pericardial effusion. 4. Dilated central pulmonary arteries suggesting pulmonary hypertension. Electronically signed by: Pinkie Pebbles MD 04/29/2024 07:36 PM EST RP Workstation: [id number]   DG Chest Portable 1 View Result Date: 04/29/2024 EXAM: 1 VIEW(S) XRAY OF THE CHEST 04/29/2024 04:07:00 PM COMPARISON: 07/22/2022 CLINICAL HISTORY: SOB FINDINGS: LUNGS AND PLEURA: Small to moderate  right and small left pleural effusions. Increased diffuse interstitial opacities. Hazy opacities at bilateral lung bases. No pneumothorax. HEART AND MEDIASTINUM: Cardiomegaly. Atherosclerotic calcifications. BONES AND SOFT TISSUES: No acute osseous abnormality. IMPRESSION: 1. Small to moderate right and small left pleural effusions. 2. Increased diffuse interstitial opacities and hazy opacities at the lung bases, which may reflect pulmonary edema. 3. Cardiomegaly. Electronically signed by: Katheleen Faes MD 04/29/2024 04:31 PM EST RP Workstation: [id number]     Procedures   Medications Ordered in the ED  furosemide (LASIX) 200 mg in dextrose 5 % 100 mL (2 mg/mL) infusion (2 mg/hr Intravenous New Bag/Given 04/29/24 1826)  amiodarone (PACERONE) tablet 200 mg (200 mg Oral Given 04/29/24 2209)  atorvastatin  (LIPITOR) tablet 20 mg (has no administration in time range)  levothyroxine (SYNTHROID) tablet 25 mcg (has no administration in time range)  apixaban  (ELIQUIS ) tablet 5 mg (5 mg Oral Given 04/29/24 2209)  acetaminophen (TYLENOL) tablet 650 mg (has no administration in time range)    Or  acetaminophen (TYLENOL) suppository 650 mg (has no administration in time range)  ondansetron  (ZOFRAN ) tablet 4 mg (has no administration in time range)    Or  ondansetron  (ZOFRAN ) injection 4 mg (has no administration in time range)  potassium chloride SA (KLOR-CON M) CR tablet 40 mEq (40 mEq Oral Given 04/29/24 1817)  potassium chloride 10 mEq in 100 mL IVPB (0 mEq Intravenous Stopped 04/29/24 2124)  magnesium sulfate IVPB 2 g 50 mL (0 g Intravenous Stopped 04/29/24 1849)  potassium chloride SA (KLOR-CON M) CR tablet 40 mEq (40 mEq Oral Given 04/29/24 2209)    Clinical Course as of 04/29/24 2350  Fri Apr 29, 2024  1659 Bedside echo performed, LVEF 55 to 60%, no pericardial effusion appreciated.  No RV strain appreciated.  Patient also has reportedly been taking her Eliquis  for several years, has not missed  any doses.  IVC is approximately 2.5 cm in size, extremely posterior, not collapsible with inspiration.  Concerning for diastolic heart failure.  Severely dilated bilateral atria. [BS]  1711 Per discussion with cardiology, they recommend starting on a very low dosage of Lasix infusion, this patient is overall very sensitive to Lasix at home.  This will be able to help with starting  diuresis, without causing significant hypotension per prior hx. [BS]    Clinical Course User Index [BS] Arlee Katz, MD                                 Medical Decision Making Amount and/or Complexity of Data Reviewed Labs: ordered. Radiology: ordered.  Risk Prescription drug management. Decision regarding hospitalization.   Based on patient presentation, history, evaluation, high suspicion for severe CHF exacerbation with associated HFpEF, primarily diastolic in nature, with severe fluid overload status.  Also, concern for significant hypokalemia, cardiorenal syndrome.  Patient initially requiring nonrebreather on arrival, initial plan to be placed on BiPAP however was able to titrate down to 6 L O2 [place].  Patient initially mildly hypotensive on arrival, very symptomatic associated with diuresis, therefore started on a low-dose Lasix infusion per recommendation from cardiology service.  Cardiology service evaluated patient, recommend admission to hospitalist service for continued management, and will continue to consult.  Of note, EF well-maintained while in the ED.  Discussed with hospitalist service, and agreeable to admission for above.  Improvement in hemodynamic status, have been able to titrate down on oxygen while in the ED.     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

## 2024-04-29 NOTE — H&P (Signed)
 History and Physical    Carla Petersen FMW:[id number] DOB: 1940/11/29 DOA: 04/29/2024  PCP: Carla Other, MD   Chief Complaint:  claudette  HPI: Carla Petersen is a 83 y.o. female with medical history significant of CHF, Graves who presented to the emergency department due to heart palpitations.  Patient was found to be in atrial fibs/flutter at routine cardiology follow-up.  She had been developing worsening shortness of breath and swelling.  Given her increased work of breathing she was transferred to [facility] for further assessment.  On arrival she was placed on BiPAP.  Labs were obtained which showed potassium 3.3, creatinine 1.2, WBC 12.5, BNP 584, troponin 7.  Chest x-ray was obtained which showed pleural effusions and increasing interstitial opacities.  CT chest showed similar findings.  Patient had intermittent hypotension so cardiology was consulted.  They recommended repeat echocardiogram and starting Lasix drip.  Patient was admitted for further workup.   Review of Systems: Review of Systems  Constitutional: Negative.   HENT: Negative.    Eyes: Negative.   Respiratory: Negative.    Cardiovascular: Negative.   Gastrointestinal: Negative.   Genitourinary: Negative.   Musculoskeletal: Negative.   Skin: Negative.   Neurological: Negative.   Endo/Heme/Allergies: Negative.   Psychiatric/Behavioral: Negative.       As per HPI otherwise 10 point review of systems negative.   Allergies[1]  Past Medical History:  Diagnosis Date   CHF (congestive heart failure) (HCC)    Graves disease    Personal history of radiation therapy    Left Breast Cancer    Past Surgical History:  Procedure Laterality Date   BREAST EXCISIONAL BIOPSY Left    BREAST EXCISIONAL BIOPSY Left    BREAST LUMPECTOMY Left 1995   MASTECTOMY Left      reports that she has quit smoking. She quit smokeless tobacco use about 30 years ago. She reports that she does not drink alcohol and does not use  drugs.  Family History  Problem Relation Age of Onset   Breast cancer Neg Hx     Prior to Admission medications  Medication Sig Start Date End Date Taking? Authorizing Provider  acetaminophen (TYLENOL) 500 MG tablet Take 500 mg by mouth daily as needed. PRN 08/04/14  Yes [provider]  ALPRAZolam (XANAX) 0.5 MG tablet Take 0.5 mg by mouth daily as needed. 01/17/16  Yes [provider]  amiodarone (PACERONE) 200 MG tablet Take 200 mg by mouth 2 (two) times daily. 04/22/24 05/19/24 Yes [provider]  amLODipine  (NORVASC ) 5 MG tablet TAKE 1 TABLET BY MOUTH DAILY 09/29/23  Yes Kate Lonni CROME, MD  atorvastatin  (LIPITOR) 20 MG tablet TAKE 1 TABLET BY MOUTH DAILY 07/03/23  Yes Kate Lonni CROME, MD  calcium  carbonate (OS-CAL) 1250 (500 Ca) MG chewable tablet Chew 1 tablet by mouth daily.   Yes [provider]  diltiazem (CARDIZEM CD) 240 MG 24 hr capsule Take 240 mg by mouth daily.   Yes [provider]  ELIQUIS  5 MG TABS tablet TAKE 1 TABLET BY MOUTH 2 TIMES A DAY 11/16/23  Yes Kate Lonni CROME, MD  ferrous sulfate 325 (65 FE) MG EC tablet Take 325 mg by mouth daily with breakfast.   Yes [provider]  furosemide (LASIX) 40 MG tablet Take 40 mg by mouth daily as needed. 04/22/24 05/22/24 Yes [provider]  levothyroxine (SYNTHROID, LEVOTHROID) 25 MCG tablet Take 25 mcg by mouth daily.   Yes [provider]  metoprolol  tartrate (LOPRESSOR ) 50  MG tablet Take 50 mg by mouth 3 (three) times daily. 04/22/24 05/22/24 Yes [provider]  Multiple Vitamins-Minerals (PRESERVISION AREDS 2+MULTI VIT) CAPS Take 1 capsule by mouth daily.   Yes [provider]  Nutritional Supplements (VITAMIN D MAINTENANCE PO) Take 800 Units by mouth daily.   Yes [provider]  Omega-3 Fatty Acids (FISH OIL CONCENTRATE) 300 MG CAPS Take 1 capsule by mouth daily.   Yes [provider]  Vitamin D,  Cholecalciferol, 25 MCG (1000 UT) CAPS Take 1 capsule by mouth daily.   Yes [provider]    Physical Exam: Vitals:   04/29/24 1915 04/29/24 1930 04/29/24 1945 04/29/24 1956  BP: 115/71 114/87 121/72   Pulse: 98 93 94   Resp: (!) 25 15 (!) 22   Temp:    97.7 F (36.5 C)  TempSrc:    Oral  SpO2: 94% 95% (!) 86%    Physical Exam Vitals reviewed.  Constitutional:      Appearance: She is normal weight.  HENT:     Head: Normocephalic.     Mouth/Throat:     Mouth: Mucous membranes are moist.  Eyes:     Pupils: Pupils are equal, round, and reactive to light.  Cardiovascular:     Rate and Rhythm: Normal rate and regular rhythm.  Pulmonary:     Effort: Pulmonary effort is normal.     Breath sounds: Normal breath sounds.  Abdominal:     Palpations: Abdomen is soft.  Musculoskeletal:        General: Normal range of motion.     Cervical back: Normal range of motion.  Skin:    General: Skin is warm.     Capillary Refill: Capillary refill takes less than 2 seconds.  Neurological:     General: No focal deficit present.     Mental Status: She is alert.        Labs on Admission: I have personally reviewed the patients's labs and imaging studies.  Assessment/Plan Principal Problem:   Acute exacerbation of CHF (congestive heart failure) (HCC)   # Acute on chronic heart failure heart failure exacerbation # Acute hypoxic respiratory failure most likely secondary to volume overload - Patient had echocardiogram 2024 which showed EF 60% - Pleural effusions on imaging-  Plan: Continue IV diuresis Check labs in morning - Paroxysmal A-fib-continue Eliquis , amiodarone  # Hyperlipidemia-continue Lipitor  # Hypertension-hold antihypertensives due to hypotension  # Hypothyroidism-continue levothyroxine  # Hypokalemia-replete potassium   Admission status: Inpatient Progressive  Certification: The appropriate patient status for this patient is INPATIENT. Inpatient  status is judged to be reasonable and necessary in order to provide the required intensity of service to ensure the patient's safety. The patient's presenting symptoms, physical exam findings, and initial radiographic and laboratory data in the context of their chronic comorbidities is felt to place them at high risk for further clinical deterioration. Furthermore, it is not anticipated that the patient will be medically stable for discharge from the hospital within 2 midnights of admission.   * I certify that at the point of admission it is my clinical judgment that the patient will require inpatient hospital care spanning beyond 2 midnights from the point of admission due to high intensity of service, high risk for further deterioration and high frequency of surveillance required.DEWAINE Lamar Dess MD [facility] If 7PM-7AM, please contact night-coverage [email or web address]  04/29/2024, 8:20 PM           [1]  Allergies Allergen Reactions   Latex Swelling   Penicillins Rash    Total body rash from ankles to neck.   Sulfa Antibiotics Nausea And Vomiting   Pedi-Pre Tape Spray [Wound Dressing Adhesive] Rash    A band-aid caused rash on finger

## 2024-04-29 NOTE — ED Notes (Signed)
 EDP at [facility]. RT into room with Bipap. Family x2 at [facility]. Pt remains alert, NAD, calm, interactive.

## 2024-04-29 NOTE — ED Notes (Signed)
 Pt being weined off high flow o2 slowly in triage. Placed on [place] at 4lpm to see how she tolerates same.

## 2024-04-29 NOTE — Patient Instructions (Signed)
 Medication Instructions:  Your physician recommends that you continue on your current medications as directed. Please refer to the Current Medication list given to you today.  *If you need a refill on your cardiac medications before your next appointment, please call your pharmacy*  Lab Work: NONE If you have labs (blood work) drawn today and your tests are completely normal, you will receive your results only by: MyChart Message (if you have MyChart) OR A paper copy in the mail If you have any lab test that is abnormal or we need to change your treatment, we will call you to review the results.  Testing/Procedures: NONE  Follow-Up: At [facility], you and your health needs are our priority.  As part of our continuing mission to provide you with exceptional heart care, our providers are all part of one team.  This team includes your primary Cardiologist (physician) and Advanced Practice Providers or APPs (Physician Assistants and Nurse Practitioners) who all work together to provide you with the care you need, when you need it.  Your next appointment:   2 week(s)  Provider:   Katlyn West, NP  We recommend signing up for the patient portal called MyChart.  Sign up information is provided on this After Visit Summary.  MyChart is used to connect with patients for Virtual Visits (Telemedicine).  Patients are able to view lab/test results, encounter notes, upcoming appointments, etc.  Non-urgent messages can be sent to your provider as well.   To learn more about what you can do with MyChart, go to [email or web address].   Other Instructions Please go right to the emergency department.

## 2024-04-29 NOTE — ED Notes (Signed)
 Cards at [facility].

## 2024-04-29 NOTE — Consult Note (Addendum)
 Cardiology Consultation   Patient ID: Carla Petersen MRN: [id number]; DOB: May 13, 1941  Admit date: 04/29/2024 Date of Consult: 04/29/2024  PCP:  Ransom Other, MD   [facility] Providers Cardiologist:  Lonni LITTIE Nanas, MD      Patient Profile: Carla Petersen is a 83 y.o. female with a hx of Graves disease, HLD, prediabetes, breast cancer, HFpEF, persistent Afib on eliquis  and amiodarone who is being seen 04/29/2024 for the evaluation of CHF and AFib at the request of Dr. Darra.  History of Present Illness: [name] was referred to cardiology for palpitations found ot have Afibflutter on heart monitor with a burden of less than 1%. Echocardiogram in 10/2019 showed normal biventricular function, no significant valvular disease. She was started Eliquis  5 mg twice daily and metoprolol  25 mg twice daily. Calcium  score 179, 54th percentile in 04/2022. Echo in 09/2022 showed EF 6065%, G1 DD, normal RV function, aortic valve sclerosis. 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   On chart review on 04/12/2024 patient presented to [facility] in [place], Carla Petersen , was in the area for the holidays.  She presented the emergency room due to shortness of breath and palpitations, was noted be in atrial fibrillation with RVR with heart rates in the 140s.  Oxygen saturation was 83% in the prehospital setting, she was admitted and started on IV diltiazem.  Chest x-ray demonstrated moderate right sided pleural effusion and small left-sided pleural effusion.  CT chest negative for PE.  She was given IV Lasix as well as initiated on antibiotic therapy.  EP was consulted, patient underwent cardioversion on 04/18/2024 but remained in atrial fibrillation, was started on amiodarone drip then transition to oral amiodarone.  Patient had bilateral pleural effusions and pericardial effusion, BNP 249.   Patient was given IV Lasix, had right sided thoracentesis for pleural effusion with removal of 500 mL fluid on 04/13/24.  Echo per notes showed EF 55 to 60%.  Patient required oxygen during hospitalization, discharged on 2 L nasal cannula.  Patient was discharged on amiodarone 200 mg twice daily.   Echocardiogram on 04/13/24 indicated mild concentric LVH, LVEF 55 to 60%, wall motion assessment was limited, diastolic dysfunction was indeterminant, RV systolic function was normal, LA was severely dilated, RA was normal.  Noted to have mild mitral annular calcification, no evidence of mitral valve prolapse, mild mitral regurgitation, aortic valve appeared moderately sclerotic, mild aortic stenosis, noted to have a small pericardial effusion.  Of note, she  was discharged 04/23/24 with new supplemental O2.    She was seen in the office today for routine cardiology follow up. She was significantly SOB with hypoxia and lower extremity swelling, EKG with SR but prolonged QTC. Given hypoxia, she was referred to [facility] for further evaluation.   On arrival, she was placed on supplemental O2 with plans for BiPAP. She has had SOB since early Nov. She denies palpitations.   No chest pain, but LE edema. She has not responded to lasix at home. She has gained about 4-5 lbs over the last several days. She has been compliant with medications including eliquis .   CXR today with right > left pleural effusions with pulmonary edema.  sCr 1.3  K 2.7  RT preparing for BiPAP - O2 sat now 94% on O2, will hold off on BiPAP for now.     Past Medical History:  Diagnosis Date   CHF (  congestive heart failure) (HCC)    Graves disease    Personal history of radiation therapy    Left Breast Cancer    Past Surgical History:  Procedure Laterality Date   BREAST EXCISIONAL BIOPSY Left    BREAST EXCISIONAL BIOPSY Left    BREAST LUMPECTOMY Left 1995   MASTECTOMY Left      Home Medications:  Prior to Admission medications   Medication Sig Start Date End Date Taking? Authorizing Provider  acetaminophen (TYLENOL) 500 MG tablet Take 500 mg by mouth. PRN Patient not taking: Reported on 04/29/2024 08/04/14   [provider]  ALPRAZolam (XANAX) 0.5 MG tablet Take 0.5 mg by mouth. 01/17/16   [provider]  amiodarone (PACERONE) 200 MG tablet Take 200 mg by mouth 2 (two) times daily. 04/22/24 05/19/24  [provider]  amLODipine  (NORVASC ) 5 MG tablet TAKE 1 TABLET BY MOUTH DAILY 09/29/23   Kate Lonni CROME, MD  ascorbic acid (VITAMIN C) 500 MG tablet Take by mouth. Patient not taking: Reported on 04/29/2024 10/05/14   [provider]  atorvastatin  (LIPITOR) 20 MG tablet TAKE 1 TABLET BY MOUTH DAILY 07/03/23   Kate Lonni CROME, MD  calcium  carbonate (OS-CAL) 1250 (500 Ca) MG chewable tablet Chew by mouth.    [provider]  diltiazem (CARDIZEM CD) 240 MG 24 hr capsule Take 240 mg by mouth daily.    [provider]  ELIQUIS  5 MG TABS tablet TAKE 1 TABLET BY MOUTH 2 TIMES A DAY 11/16/23   Kate Lonni CROME, MD  ferrous sulfate 220 (44 FE) MG/5ML solution Take 65 mg by mouth daily.    [provider]  furosemide (LASIX) 40 MG tablet Take 40 mg by mouth. 04/22/24 05/22/24  [provider]  levothyroxine (SYNTHROID, LEVOTHROID) 25 MCG tablet Take 25 mcg by mouth daily.    [provider]  metoprolol  tartrate (LOPRESSOR ) 50 MG tablet Take 50 mg by mouth 3 (three) times daily. 04/22/24 05/22/24  [provider]  Multiple Vitamins-Minerals (PRESERVISION AREDS 2+MULTI VIT) CAPS     [provider]  Nutritional Supplements (VITAMIN D MAINTENANCE PO) Take 800 Units by mouth daily.    [provider]  Omega-3 Fatty Acids (FISH OIL CONCENTRATE) 300 MG CAPS 1 capsule    [provider]  Vitamin D, Cholecalciferol, 25 MCG (1000 UT) CAPS 1 tablet    [provider]    Scheduled Meds:  potassium chloride   40 mEq Oral Once   Continuous Infusions:  furosemide (LASIX) 200 mg in dextrose 5 % 100 mL (2 mg/mL) infusion     magnesium sulfate bolus IVPB 2 g (04/29/24 1749)   potassium chloride 10 mEq (04/29/24 1748)   PRN Meds:   Allergies:   Allergies[1]  Social History:   Social History   Socioeconomic History   Marital status: Married    Spouse name: Not on file   Number of children: Not on file   Years of education: Not on file   Highest education level: Not on file  Occupational History   Not on file  Tobacco Use   Smoking status: Former   Smokeless tobacco: Former    Quit date: 07/06/1993  Substance and Sexual Activity   Alcohol use: No   Drug use: No   Sexual activity: Not on file  Other Topics Concern   Not on file  Social History Narrative   Not on file   Social Drivers of Health   Tobacco Use: Medium  Risk (04/29/2024)   Patient History    Smoking Tobacco Use: Former    Smokeless Tobacco Use: Former    Passive Exposure: Not on Stage Manager: Not on file  Food Insecurity: Not on file  Transportation Needs: No Transportation Needs (04/13/2024)   Received from [facility]   Epic    In the past 12 months, has lack of transportation kept you from medical appointments or from getting medications?: No    In the past 12 months, has lack of transportation kept you from meetings, work, or from getting things needed for daily living?: No  Physical Activity: Not on file  Stress: Not on file  Social Connections: Not on file  Intimate Partner Violence: Not on file  Depression (EYV7-0): Not on file  Alcohol Screen: Not on file  Housing: Low Risk (04/13/2024)   Received from [facility]   Epic    In the last 12 months, was there a time when you were not able to pay the mortgage or rent on time?: No    In the past 12 months, how many times have you moved where you were living?: 0    At any time in the past 12 months, were you  homeless or living in a shelter (including now)?: No  Utilities: Not on file  Health Literacy: Not on file    Family History:    Family History  Problem Relation Age of Onset   Breast cancer Neg Hx      ROS:  Please see the history of present illness.   All other ROS reviewed and negative.     Physical Exam/Data: Vitals:   04/29/24 1657 04/29/24 1658 04/29/24 1659 04/29/24 1715  BP:  (!) 106/48  (!) 101/57  Pulse: 70 69 70 71  Resp: 19 17 (!) 26 (!) 27  Temp:      SpO2: 100% 100% 100% 99%   No intake or output data in the 24 hours ending 04/29/24 1809    04/29/2024    2:19 PM 09/17/2023    4:05 PM 03/16/2023    1:27 PM  Last 3 Weights  Weight (lbs) 180 lb 6.4 oz 174 lb 3.2 oz 177 lb 3.2 oz  Weight (kg) 81.829 kg 79.017 kg 80.377 kg     There is no height or weight on file to calculate BMI.  General:  Well nourished, well developed, in no acute distress HEENT: normal Neck: + JVD Vascular: No carotid bruits; Distal pulses 2+ bilaterally Cardiac:  normal S1, S2; RRR; no murmur  Lungs:  crackles bilaterally  Abd: soft, nontender, no hepatomegaly  Ext: B LE edema, cool LE extremities Musculoskeletal:  No deformities, BUE and BLE strength normal and equal Skin: warm and dry  Neuro:  CNs 2-12 intact, no focal abnormalities noted Psych:  Normal affect   EKG:  The EKG was personally reviewed and demonstrates:  SR with HR 71, QTC 606 ms Telemetry:  Telemetry was personally reviewed and demonstrates:  SR in the 70s  Relevant CV Studies:  Echo limited pending   Laboratory Data: High Sensitivity Troponin:  No results for input(s): TROPONINIHS in the last 720 hours. No results for input(s): TRNPT in the last 720 hours.    Chemistry Recent Labs  Lab 04/29/24 1546 04/29/24 1713  NA 139 142  140  K 3.3* 2.7*  2.7*  CL 93* 94*  CO2 35*  --   GLUCOSE 119* 120*  BUN 23 26*  CREATININE 1.24* 1.30*  CALCIUM  8.9  --   GFRNONAA 43*  --   ANIONGAP 11  --     No  results for input(s): PROT, ALBUMIN, AST, ALT, ALKPHOS, BILITOT in the last 168 hours. Lipids No results for input(s): CHOL, TRIG, HDL, LABVLDL, LDLCALC, CHOLHDL in the last 168 hours.  Hematology Recent Labs  Lab 04/29/24 1546 04/29/24 1713  WBC 12.5*  --   RBC 4.40  --   HGB 13.6 14.6  14.3  HCT 42.4 43.0  42.0  MCV 96.4  --   MCH 30.9  --   MCHC 32.1  --   RDW 14.3  --   PLT 234  --    Thyroid  No results for input(s): TSH, FREET4 in the last 168 hours.  BNP Recent Labs  Lab 04/29/24 1546  BNP 584.1*    DDimer No results for input(s): DDIMER in the last 168 hours.  Radiology/Studies:  DG Chest Portable 1 View Result Date: 04/29/2024 EXAM: 1 VIEW(S) XRAY OF THE CHEST 04/29/2024 04:07:00 PM COMPARISON: 07/22/2022 CLINICAL HISTORY: SOB FINDINGS: LUNGS AND PLEURA: Small to moderate right and small left pleural effusions. Increased diffuse interstitial opacities. Hazy opacities at bilateral lung bases. No pneumothorax. HEART AND MEDIASTINUM: Cardiomegaly. Atherosclerotic calcifications. BONES AND SOFT TISSUES: No acute osseous abnormality. IMPRESSION: 1. Small to moderate right and small left pleural effusions. 2. Increased diffuse interstitial opacities and hazy opacities at the lung bases, which may reflect pulmonary edema. 3. Cardiomegaly. Electronically signed by: Katheleen Faes MD 04/29/2024 04:31 PM EST RP Workstation: [id number]     Assessment and Plan:  Acute HFpEF - recent echo with preserved EF - will obtain limited echo to confirm preserved LVEF - will start low dose lasix gtt - she reports significant hypotension with IV lasix during recent hospitalization - will need aggressive potassium supplementation   R > L pleural effusions - recent right thoracentesis with 500 cc drained during last hospitalization - CXR appears to have re-accumulation of effusions - suspect we will need CT chest to better examine effusion, low suspicion for  PE given recent TEE and eliquis  compliance   Hypokalemia AKI - K 2.9, sCr 1.3 - IV and PO potassium has been ordered - suspect she may need additional PO dose given planned lasix gtt -  Mg not yet drawn, agree with 2 g IV Mg while supplementing K   PAF - remains in SR - but QTC prolonged - discontinue amiodarone - start 25 mg lopressor  BID for now, can increase depending on BP and rate and rhythm   Chronic anticoagulation - hold eliquis  for now, but needs anticoagulation given recent DCCV - would start heparin gtt, per pharmacy due to possible need for repeat thoracentesis    Risk Assessment/Risk Scores:    New [place]  Heart Association (NYHA) Functional Class NYHA Class IV  CHA2DS2-VASc Score = 5   This indicates a 7.2% annual risk of stroke. The patient's score is based upon: CHF History: 1 HTN History: 1 Diabetes History: 0 Stroke History: 0 Vascular Disease History: 0 Age Score: 2 Gender Score: 1      For questions or updates, please contact [facility] Please consult [email or web address] for contact info under      Signed, Jon Nat Hails, [place]  04/29/2024 6:09 PM   Patient seen and examined, note reviewed with the signed Advanced Practice Provider. I personally reviewed laboratory data, imaging studies and relevant notes. I independently examined the patient and formulated the important aspects of  the plan. I have personally discussed the plan with the patient and/or family. Comments or changes to the note/plan are indicated below.  Patient profile: This is an 83 year old female with past medical history of Graves' disease, hyperlipidemia, prediabetes, breast cancer, HFpEF, persistent atrial fibrillation on Eliquis  and amiodarone with a recent hospitalization on 04/12/2024 at [facility] in [place]  with shortness of breath and palpitations noted to be in atrial fibrillation with RVR and hypoxia.  She was found to have a  right-sided pleural effusion and small left-sided pleural effusion and subsequently underwent right-sided thoracentesis and had a cardioversion on 12/1.  She returned to atrial fibrillation and was started on amiodarone.  She was discharged this past Saturday. She was seen in the cardiology office today and was noted to be hypoxic with lower extremity swelling and sinus rhythm with prolonged QT prompting her to be referred to the ED.  Cardiology was consulted to assist with heart failure and atrial fibrillation.     My Exam:  Physical Exam Vitals and nursing note reviewed.  Constitutional:      Appearance: Normal appearance.  HENT:     Head: Normocephalic and atraumatic.  Eyes:     Conjunctiva/sclera: Conjunctivae normal.  Neck:     Comments: Positive JVD Cardiovascular:     Rate and Rhythm: Normal rate and regular rhythm.  Pulmonary:     Comments: Decreased right-sided breath sounds with left-sided rales Musculoskeletal:        General: No swelling or tenderness.  Skin:    Coloration: Skin is not jaundiced or pale.  Neurological:     Mental Status: She is alert.      Telemetry: Normal sinus rhythm- Personally reviewed EKG: Normal sinus rhythm with PVCs and prolonged QT- Personally reviewed   Assessment & Plan:  Acute on chronic diastolic heart failure patient has a history of high sensitivity to Lasix.  Will start on low-dose Lasix drip with potassium supplementation.  Limited echocardiogram.  Daily weights and intake/output. Bilateral pleural effusions right greater than left with a history of recent right-sided thoracentesis-CT chest shows moderate bilateral pleural effusions and findings suggestive of pneumonia Acute hypoxic respiratory failure, likely multifactorial: Acute heart failure exacerbation as well as pneumonia-diuresis and will defer antibiotics to the primary service Prolonged QT likely due to electrolyte derangements.  Will discontinue  amiodarone AKI Hypokalemia Paroxysmal atrial fibrillation, currently in sinus rhythm  Signed, Emeline Calender, DO [place]  [facility]  04/29/2024 8:29 PM       [1]  Allergies Allergen Reactions   Latex Swelling   Penicillins Rash    Total body rash from ankles to neck.   Sulfa Antibiotics Nausea And Vomiting   Pedi-Pre Tape Spray [Wound Dressing Adhesive] Rash    A band-aid caused rash on finger

## 2024-04-29 NOTE — Progress Notes (Signed)
 RT came to patient room due to patient being ordered bipap.  Upon arrival patient was noted to be on NRB and [place] with sats of 100%.  RT removed NRB in order to setup bipap and to see how patient does.  While RT was setting up the bipap machine, patient's sats maintained at 94-96% on 6L [place].  Cardiology MD also came to patient room.  While patient was answering questions for MD sats still remained between 94-96%.  Per MD, will hold on bipap at this time.  Will leave at bedside due patient needing to go to CT and prior desat issues.  Will continue to monitor.

## 2024-04-29 NOTE — ED Triage Notes (Signed)
 Pt sent by cardiologist office for SOB, hypoxia and leg edema. Pt has hx of CHF, not taking lasix daily- last does Tuesday. Pt 81% on 2L [place], pt placed on NRB in triage and up to 92%.  Pt has recent hospitalization out of town

## 2024-04-29 NOTE — ED Notes (Signed)
 EDP at [facility]

## 2024-04-30 ENCOUNTER — Inpatient Hospital Stay (HOSPITAL_COMMUNITY)

## 2024-04-30 ENCOUNTER — Other Ambulatory Visit: Payer: Self-pay

## 2024-04-30 DIAGNOSIS — J9601 Acute respiratory failure with hypoxia: Secondary | ICD-10-CM | POA: Diagnosis not present

## 2024-04-30 DIAGNOSIS — I4891 Unspecified atrial fibrillation: Secondary | ICD-10-CM | POA: Insufficient documentation

## 2024-04-30 DIAGNOSIS — I5033 Acute on chronic diastolic (congestive) heart failure: Secondary | ICD-10-CM | POA: Diagnosis not present

## 2024-04-30 DIAGNOSIS — I5021 Acute systolic (congestive) heart failure: Secondary | ICD-10-CM | POA: Diagnosis not present

## 2024-04-30 LAB — BASIC METABOLIC PANEL WITH GFR
Anion gap: 8 (ref 5–15)
BUN: 18 mg/dL (ref 8–23)
CO2: 34 mmol/L — ABNORMAL HIGH (ref 22–32)
Calcium: 8.5 mg/dL — ABNORMAL LOW (ref 8.9–10.3)
Chloride: 99 mmol/L (ref 98–111)
Creatinine, Ser: 1.12 mg/dL — ABNORMAL HIGH (ref 0.44–1.00)
GFR, Estimated: 48 mL/min — ABNORMAL LOW (ref >60)
Glucose, Bld: 112 mg/dL — ABNORMAL HIGH (ref 70–99)
Potassium: 4.1 mmol/L (ref 3.5–5.1)
Sodium: 141 mmol/L (ref 135–145)

## 2024-04-30 LAB — HEPATIC FUNCTION PANEL
ALT: 17 U/L (ref 0–44)
AST: 21 U/L (ref 15–41)
Albumin: 2.5 g/dL — ABNORMAL LOW (ref 3.5–5.0)
Alkaline Phosphatase: 128 U/L — ABNORMAL HIGH (ref 38–126)
Bilirubin, Direct: 0.2 mg/dL (ref 0.0–0.2)
Indirect Bilirubin: 0.9 mg/dL (ref 0.3–0.9)
Total Bilirubin: 1.1 mg/dL (ref 0.0–1.2)
Total Protein: 6.4 g/dL — ABNORMAL LOW (ref 6.5–8.1)

## 2024-04-30 LAB — ECHOCARDIOGRAM LIMITED
AV Mean grad: 12.3 mmHg
AV Peak grad: 22 mmHg
Ao pk vel: 2.35 m/s
Est EF: >75
Height: 63 in
S' Lateral: 2.6 cm
Weight: 2610.25 [oz_av]

## 2024-04-30 LAB — CBC
HCT: 39.9 % (ref 36.0–46.0)
Hemoglobin: 12.6 g/dL (ref 12.0–15.0)
MCH: 30.4 pg (ref 26.0–34.0)
MCHC: 31.6 g/dL (ref 30.0–36.0)
MCV: 96.1 fL (ref 80.0–100.0)
Platelets: 230 K/uL (ref 150–400)
RBC: 4.15 MIL/uL (ref 3.87–5.11)
RDW: 14.6 % (ref 11.5–15.5)
WBC: 9.6 K/uL (ref 4.0–10.5)
nRBC: 0 % (ref 0.0–0.2)

## 2024-04-30 LAB — MRSA NEXT GEN BY PCR, NASAL: MRSA by PCR Next Gen: NOT DETECTED

## 2024-04-30 LAB — MAGNESIUM: Magnesium: 2.1 mg/dL (ref 1.7–2.4)

## 2024-04-30 LAB — PROTIME-INR
INR: 2.3 — ABNORMAL HIGH (ref 0.8–1.2)
Prothrombin Time: 26.1 s — ABNORMAL HIGH (ref 11.4–15.2)

## 2024-04-30 MED ORDER — FUROSEMIDE 10 MG/ML IJ SOLN
40.0000 mg | Freq: Two times a day (BID) | INTRAMUSCULAR | Status: DC
  Administered 2024-04-30: 40 mg via INTRAVENOUS
  Filled 2024-04-30: qty 4

## 2024-04-30 MED ORDER — FUROSEMIDE 10 MG/ML IJ SOLN
60.0000 mg | Freq: Once | INTRAMUSCULAR | Status: AC
  Administered 2024-04-30: 60 mg via INTRAVENOUS
  Filled 2024-04-30: qty 6

## 2024-04-30 MED ORDER — ORAL CARE MOUTH RINSE: 15.0000 mL | OROMUCOSAL | Status: DC | PRN

## 2024-04-30 MED ORDER — METOPROLOL TARTRATE 25 MG PO TABS
25.0000 mg | ORAL_TABLET | Freq: Two times a day (BID) | ORAL | Status: DC
  Administered 2024-04-30: 25 mg via ORAL
  Filled 2024-04-30: qty 1

## 2024-04-30 NOTE — ED Notes (Signed)
 Receiving floor RN noted of pt's change in BP and message sent to Dr. Lue.

## 2024-04-30 NOTE — Progress Notes (Addendum)
 Rounding Note   Patient Name: Carla Petersen Date of Encounter: 04/30/2024  [facility] Cardiologist: Lonni LITTIE Nanas, MD   Subjective I/Os not recorded.  Cr 1.1.  Potassium improved to 4.1. Reports continues to report shortness of breath.  On 5L [place]  Scheduled Meds:  amiodarone   200 mg Oral BID   apixaban   5 mg Oral BID   atorvastatin   20 mg Oral Daily   levothyroxine   25 mcg Oral Q0600   Continuous Infusions:  furosemide  (LASIX ) 200 mg in dextrose  5 % 100 mL (2 mg/mL) infusion 2 mg/hr (04/30/24 0345)   PRN Meds: acetaminophen  **OR** acetaminophen , ondansetron  **OR** ondansetron  (ZOFRAN ) IV   Vital Signs  Vitals:   04/30/24 0445 04/30/24 0500 04/30/24 0515 04/30/24 0545  BP: (!) 110/59 108/64 131/72 (!) 116/44  Pulse: 99 91 (!) 103 (!) 110  Resp: 18 17 (!) 25 (!) 27  Temp:      TempSrc:      SpO2: 93% 94% 93% 92%    Intake/Output Summary (Last 24 hours) at 04/30/2024 0821 Last data filed at 04/30/2024 0345 Gross per 24 hour  Intake 159.28 ml  Output --  Net 159.28 ml      04/29/2024    2:19 PM 09/17/2023    4:05 PM 03/16/2023    1:27 PM  Last 3 Weights  Weight (lbs) 180 lb 6.4 oz 174 lb 3.2 oz 177 lb 3.2 oz  Weight (kg) 81.829 kg 79.017 kg 80.377 kg      Telemetry Afib 100-110s - Personally Reviewed  ECG  No new ECG - Personally Reviewed  Physical Exam  GEN: No acute distress.   Neck: + JVD Cardiac: irregular, tachycardic, no murmurs Respiratory: Bibasilar crackles GI: Soft MS: 2+ edema Neuro:  Nonfocal  Psych: Normal affect   Labs High Sensitivity Troponin:   Recent Labs  Lab 04/29/24 1624 04/29/24 1957  TROPONINIHS 7 8   No results for input(s): TRNPT in the last 720 hours.     Chemistry Recent Labs  Lab 04/29/24 1546 04/29/24 1713 04/29/24 1957 04/30/24 0342  NA 139 142  140 140 141  K 3.3* 2.7*  2.7* 3.7 4.1  CL 93* 94* 95* 99  CO2 35*  --  34* 34*  GLUCOSE 119* 120* 112* 112*  BUN 23 26* 22 18   CREATININE 1.24* 1.30* 1.13* 1.12*  CALCIUM  8.9  --  8.6* 8.5*  MG  --   --  2.6* 2.1  GFRNONAA 43*  --  48* 48*  ANIONGAP 11  --  11 8    Lipids No results for input(s): CHOL, TRIG, HDL, LABVLDL, LDLCALC, CHOLHDL in the last 168 hours.  Hematology Recent Labs  Lab 04/29/24 1546 04/29/24 1713 04/30/24 0342  WBC 12.5*  --  9.6  RBC 4.40  --  4.15  HGB 13.6 14.6  14.3 12.6  HCT 42.4 43.0  42.0 39.9  MCV 96.4  --  96.1  MCH 30.9  --  30.4  MCHC 32.1  --  31.6  RDW 14.3  --  14.6  PLT 234  --  230   Thyroid  No results for input(s): TSH, FREET4 in the last 168 hours.  BNP Recent Labs  Lab 04/29/24 1546  BNP 584.1*    DDimer No results for input(s): DDIMER in the last 168 hours.   Radiology  CT CHEST WO CONTRAST Result Date: 04/29/2024 EXAM: CT CHEST WITHOUT CONTRAST 04/29/2024 07:00:00 PM TECHNIQUE: CT of the chest was performed without  the administration of intravenous contrast. Multiplanar reformatted images are provided for review. Automated exposure control, iterative reconstruction, and/or weight based adjustment of the mA/kV was utilized to reduce the radiation dose to as low as reasonably achievable. COMPARISON: None available. CLINICAL HISTORY: Dyspnea, chronic, chest wall or pleura disease suspected. FINDINGS: MEDIASTINUM: Mild cardiomegaly with small pericardial effusion. Scattered coronary calcifications. The central airways are clear. Scattered calcified plaque in descending thoracic aorta and arch. Dilated central pulmonary arteries suggesting pulmonary hypertension. LYMPH NODES: Mildly prominent mediastinal nodes, likely reactive. No hilar or axillary lymphadenopathy. LUNGS AND PLEURA: Moderate bilateral pleural effusions. Associated lingular and bilateral lower lobe compressive atelectasis. Patchy ground-glass opacities in right upper lobe, favoring infection/inflammation over mild edema. Coarse interstitial markings in periphery of both lung bases,  suggesting mild interstitial lung disease. No pneumothorax. SOFT TISSUES/BONES: Bilateral shoulder DJD (degenerative joint disease). No acute abnormality of the bones or soft tissues. UPPER ABDOMEN: Small volume perihepatic ascites. Cirrhosis. IMPRESSION: 1. Patchy ground-glass opacities in the right upper lobe, favoring infection/inflammation over mild edema. 2. Moderate bilateral pleural effusions with associated lingular and bilateral lower lobe compressive atelectasis. 3. Mild cardiomegaly with small pericardial effusion. 4. Dilated central pulmonary arteries suggesting pulmonary hypertension. Electronically signed by: Pinkie Pebbles MD 04/29/2024 07:36 PM EST RP Workstation: [id number]   DG Chest Portable 1 View Result Date: 04/29/2024 EXAM: 1 VIEW(S) XRAY OF THE CHEST 04/29/2024 04:07:00 PM COMPARISON: 07/22/2022 CLINICAL HISTORY: SOB FINDINGS: LUNGS AND PLEURA: Small to moderate right and small left pleural effusions. Increased diffuse interstitial opacities. Hazy opacities at bilateral lung bases. No pneumothorax. HEART AND MEDIASTINUM: Cardiomegaly. Atherosclerotic calcifications. BONES AND SOFT TISSUES: No acute osseous abnormality. IMPRESSION: 1. Small to moderate right and small left pleural effusions. 2. Increased diffuse interstitial opacities and hazy opacities at the lung bases, which may reflect pulmonary edema. 3. Cardiomegaly. Electronically signed by: Dayne Hassell MD 04/29/2024 04:31 PM EST RP Workstation: [id number]    Cardiac Studies   Patient Profile   83 y.o. female with Graves' disease, hyperlipidemia, prediabetes, breast cancer, HFpEF, persistent atrial fibrillation who we are consulted for evaluation of heart failure.  She was admitted 04/12/2024 at [facility], presented with shortness of breath and palpitations and found to be in A-fib with RVR.  SpO2 down to 83%.  She was treated with IV Lasix  as well as antibiotics.  She underwent cardioversion on 04/18/2024  remains in A-fib.  Ultimately was started on amiodarone  drip and then transition to oral amiodarone .  She received IV Lasix  throughout hospitalization.  Echo showed EF 55 to 60%.  She was discharged on amiodarone  200 mg twice daily.  She presented for follow-up appointment in clinic 12/12, appeared volume overloaded and was sent to ED.  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 -She was started on low-dose Lasix  drip overnight because she reported hypotension with bolus Lasix   dosing during recent hospitalization.  From review of records, she was given IV Lasix  for days without issues but became hypotensive at the end of her hospitalization, suspect likely overdiuresis.  Would switch to bolus Lasix  dosing while volume overloaded and be cautious about overdiuresis -Check echocardiogram  Pleural effusions: CT chest shows moderate bilateral pleural effusions.  Will monitor with diuresis, if not improving will need thoracentesis  Persistent atrial fibrillation: Underwent unsuccessful cardioversion for A-fib 12/1 and was discharged on p.o. amiodarone .  At follow-up 12/12 was back in sinus rhythm.  She is back  in A-fib currently - Amiodarone  discontinued given prolonged QTc.  Suspect hypokalemia driving QT prolongation, will recheck EKG with improvement in hypokalemia - Continue Eliquis  5 mg twice daily - Start metoprolol  25 mg twice daily  Hypokalemia: 2.7 on presentation.  Repleted, goal greater than 4  For questions or updates, please contact  [facility] Please consult [email or web address] for contact info under    Signed, Lonni LITTIE Nanas, MD  04/30/2024, 8:21 AM

## 2024-04-30 NOTE — Progress Notes (Signed)
°  Echocardiogram 2D Echocardiogram has been performed.  Carla Petersen 04/30/2024, 1:49 PM

## 2024-04-30 NOTE — Progress Notes (Signed)
 PROGRESS NOTE    Carla Petersen  FMW:[id number] DOB: 1940/07/10 DOA: 04/29/2024 PCP: Ransom Other, MD   Brief Narrative:  Carla Petersen is a 83 y.o. female with medical history significant of CHF, Graves who presented to the emergency department due to heart palpitations -noted to be in A-fib/flutter with worsening shortness of breath orthopnea and dyspnea with exertion consistent with heart failure exacerbation.  Cardiology called in consult, hospitalist called to admit.  Assessment & Plan:   Principal Problem:   Acute exacerbation of CHF (congestive heart failure) (HCC)  Acute heart failure exacerbation, preserved ejection fraction -Cardiology following, appreciate insight recommendations -Current goal-directed medical therapy limited by patient's hypotension, Lasix  drip discontinued for twice daily bolus Lasix , p.m. dose on hold as below -Patient's metoprolol  also on hold as below -Repeat echocardiogram 12/13 shows EF greater than 75% with hyperdynamic function indeterminate diastolic parameters  Acute hypoxic respiratory failure secondary to above -Chest x-ray notable for bilateral pleural effusions, diuretics ongoing as above -Oxygen supplementation initially 6 L nasal cannula -wean as appropriate and tolerated   Paroxysmal A-fib -currently rate controlled, continue Eliquis , amiodarone  Hyperlipidemia-continue Lipitor Hypertension -on metoprolol  and Lasix  as above, holding parameters given hypotension   Hypothyroidism - Continue levothyroxine  Hypokalemia-replete potassium as indicated, currently within normal limits  DVT prophylaxis: SCDs Start: 04/29/24 2013 apixaban  (ELIQUIS ) tablet 5 mg   Code Status:   Code Status: Full Code  Family Communication: None present  Status is: Inpatient  Dispo: The patient is from: Home              Anticipated d/c is to: Home              Anticipated d/c date is: 48 to 72 hours              Patient currently not medically stable for  discharge  Consultants:  Cardiology  Procedures:  None  Antimicrobials:  None  Subjective: No acute issues or events overnight, respiratory status appears to be improving somewhat from prior but not yet back to baseline  Objective: Vitals:   04/30/24 0445 04/30/24 0500 04/30/24 0515 04/30/24 0545  BP: (!) 110/59 108/64 131/72 (!) 116/44  Pulse: 99 91 (!) 103 (!) 110  Resp: 18 17 (!) 25 (!) 27  Temp:      TempSrc:      SpO2: 93% 94% 93% 92%    Intake/Output Summary (Last 24 hours) at 04/30/2024 0714 Last data filed at 04/30/2024 0345 Gross per 24 hour  Intake 159.28 ml  Output --  Net 159.28 ml   There were no vitals filed for this visit.  Examination:  General:  Pleasantly resting in bed, No acute distress. HEENT:  Normocephalic atraumatic.  Sclerae nonicteric, noninjected.  Extraocular movements intact bilaterally. Neck:  Without mass or deformity.  Trachea is midline. Lungs: Bibasilar rales. Heart:  Regular rate and rhythm.  Without murmurs, rubs, or gallops. Abdomen:  Soft, nontender, nondistended.  Without guarding or rebound. Extremities: Without cyanosis, clubbing, 1-2+ pitting edema bilateral lower extremities. Skin:  Warm and dry: noted pressure injury Wound 04/30/24 1250 Pressure Injury Buttocks Medial;Bilateral Stage 1 -  Intact skin with non-blanchable redness of a localized area usually over a bony prominence. (Active)   Data Reviewed: I have personally reviewed following labs and imaging studies  CBC: Recent Labs  Lab 04/29/24 1546 04/29/24 1713 04/30/24 0342  WBC 12.5*  --  9.6  HGB 13.6 14.6  14.3 12.6  HCT 42.4 43.0  42.0 39.9  MCV 96.4  --  96.1  PLT 234  --  230   Basic Metabolic Panel: Recent Labs  Lab 04/29/24 1546 04/29/24 1713 04/29/24 1957 04/30/24 0342  NA 139 142  140 140 141  K 3.3* 2.7*  2.7* 3.7 4.1  CL 93* 94* 95* 99  CO2 35*  --  34* 34*  GLUCOSE 119* 120* 112* 112*  BUN 23 26* 22 18  CREATININE 1.24* 1.30*  1.13* 1.12*  CALCIUM  8.9  --  8.6* 8.5*  MG  --   --  2.6* 2.1   GFR: Estimated Creatinine Clearance: 37.2 mL/min (A) (by C-G formula based on SCr of 1.12 mg/dL (H)).  Coagulation Profile: Recent Labs  Lab 04/30/24 0342  INR 2.3*   Sepsis Labs: Recent Labs  Lab 04/29/24 1725  LATICACIDVEN 1.7   No results found for this or any previous visit (from the past 240 hours).   Radiology Studies: CT CHEST WO CONTRAST Result Date: 04/29/2024 EXAM: CT CHEST WITHOUT CONTRAST 04/29/2024 07:00:00 PM TECHNIQUE: CT of the chest was performed without the administration of intravenous contrast. Multiplanar reformatted images are provided for review. Automated exposure control, iterative reconstruction, and/or weight based adjustment of the mA/kV was utilized to reduce the radiation dose to as low as reasonably achievable. COMPARISON: None available. CLINICAL HISTORY: Dyspnea, chronic, chest wall or pleura disease suspected. FINDINGS: MEDIASTINUM: Mild cardiomegaly with small pericardial effusion. Scattered coronary calcifications. The central airways are clear. Scattered calcified plaque in descending thoracic aorta and arch. Dilated central pulmonary arteries suggesting pulmonary hypertension. LYMPH NODES: Mildly prominent mediastinal nodes, likely reactive. No hilar or axillary lymphadenopathy. LUNGS AND PLEURA: Moderate bilateral pleural effusions. Associated lingular and bilateral lower lobe compressive atelectasis. Patchy ground-glass opacities in right upper lobe, favoring infection/inflammation over mild edema. Coarse interstitial markings in periphery of both lung bases, suggesting mild interstitial lung disease. No pneumothorax. SOFT TISSUES/BONES: Bilateral shoulder DJD (degenerative joint disease). No acute abnormality of the bones or soft tissues. UPPER ABDOMEN: Small volume perihepatic ascites. Cirrhosis. IMPRESSION: 1. Patchy ground-glass opacities in the right upper lobe, favoring  infection/inflammation over mild edema. 2. Moderate bilateral pleural effusions with associated lingular and bilateral lower lobe compressive atelectasis. 3. Mild cardiomegaly with small pericardial effusion. 4. Dilated central pulmonary arteries suggesting pulmonary hypertension. Electronically signed by: Pinkie Pebbles MD 04/29/2024 07:36 PM EST RP Workstation: [id number]   DG Chest Portable 1 View Result Date: 04/29/2024 EXAM: 1 VIEW(S) XRAY OF THE CHEST 04/29/2024 04:07:00 PM COMPARISON: 07/22/2022 CLINICAL HISTORY: SOB FINDINGS: LUNGS AND PLEURA: Small to moderate right and small left pleural effusions. Increased diffuse interstitial opacities. Hazy opacities at bilateral lung bases. No pneumothorax. HEART AND MEDIASTINUM: Cardiomegaly. Atherosclerotic calcifications. BONES AND SOFT TISSUES: No acute osseous abnormality. IMPRESSION: 1. Small to moderate right and small left pleural effusions. 2. Increased diffuse interstitial opacities and hazy opacities at the lung bases, which may reflect pulmonary edema. 3. Cardiomegaly. Electronically signed by: Dayne Hassell MD 04/29/2024 04:31 PM EST RP Workstation: [id number]   Scheduled Meds:  amiodarone   200 mg Oral BID   apixaban   5 mg Oral BID   atorvastatin   20 mg Oral Daily   levothyroxine   25 mcg Oral Q0600   Continuous Infusions:  furosemide  (LASIX ) 200 mg in dextrose  5 % 100 mL (2 mg/mL) infusion 2 mg/hr (04/30/24 0345)     LOS: 1 day   Time spent:  Elsie JAYSON Mont[place], DO [facility]  If 7PM-7AM, please contact night-coverage [email or web address]  04/30/2024, 7:14 AM

## 2024-04-30 NOTE — Progress Notes (Signed)
 Per MD, due to hypotension hold patients 1800 dose of lasix , may need to hold bedtime metoprolol  as well, parameters added to metoprolol  instructions.

## 2024-05-01 ENCOUNTER — Inpatient Hospital Stay (HOSPITAL_COMMUNITY)

## 2024-05-01 ENCOUNTER — Other Ambulatory Visit: Payer: Self-pay

## 2024-05-01 ENCOUNTER — Encounter (HOSPITAL_COMMUNITY): Payer: Self-pay | Admitting: Internal Medicine

## 2024-05-01 DIAGNOSIS — I5033 Acute on chronic diastolic (congestive) heart failure: Secondary | ICD-10-CM | POA: Diagnosis not present

## 2024-05-01 DIAGNOSIS — J90 Pleural effusion, not elsewhere classified: Secondary | ICD-10-CM

## 2024-05-01 DIAGNOSIS — J9601 Acute respiratory failure with hypoxia: Secondary | ICD-10-CM | POA: Diagnosis not present

## 2024-05-01 DIAGNOSIS — R918 Other nonspecific abnormal finding of lung field: Secondary | ICD-10-CM | POA: Diagnosis not present

## 2024-05-01 DIAGNOSIS — I4891 Unspecified atrial fibrillation: Secondary | ICD-10-CM | POA: Diagnosis not present

## 2024-05-01 DIAGNOSIS — Z48813 Encounter for surgical aftercare following surgery on the respiratory system: Secondary | ICD-10-CM | POA: Diagnosis not present

## 2024-05-01 DIAGNOSIS — R9431 Abnormal electrocardiogram [ECG] [EKG]: Secondary | ICD-10-CM

## 2024-05-01 HISTORY — PX: PR THORACENTESIS NEEDLE/CATH PLEURA W/IMAGING: 32555

## 2024-05-01 LAB — BASIC METABOLIC PANEL WITH GFR
Anion gap: 12 (ref 5–15)
BUN: 17 mg/dL (ref 8–23)
CO2: 36 mmol/L — ABNORMAL HIGH (ref 22–32)
Calcium: 8.1 mg/dL — ABNORMAL LOW (ref 8.9–10.3)
Chloride: 91 mmol/L — ABNORMAL LOW (ref 98–111)
Creatinine, Ser: 1.14 mg/dL — ABNORMAL HIGH (ref 0.44–1.00)
GFR, Estimated: 47 mL/min — ABNORMAL LOW (ref >60)
Glucose, Bld: 112 mg/dL — ABNORMAL HIGH (ref 70–99)
Potassium: 3.1 mmol/L — ABNORMAL LOW (ref 3.5–5.1)
Sodium: 139 mmol/L (ref 135–145)

## 2024-05-01 LAB — BODY FLUID CELL COUNT WITH DIFFERENTIAL
Eos, Fluid: 0 %
Lymphs, Fluid: 82 %
Monocyte-Macrophage-Serous Fluid: 6 % — ABNORMAL LOW (ref 50–90)
Neutrophil Count, Fluid: 10 % (ref 0–25)
Other Cells, Fluid: 2 %
Total Nucleated Cell Count, Fluid: 1370 uL — ABNORMAL HIGH (ref 0–1000)

## 2024-05-01 LAB — LACTATE DEHYDROGENASE, PLEURAL OR PERITONEAL FLUID: LD, Fluid: 106 U/L — ABNORMAL HIGH (ref 3–23)

## 2024-05-01 LAB — PROTEIN, TOTAL: Total Protein: 6.7 g/dL (ref 6.5–8.1)

## 2024-05-01 LAB — ALBUMIN: Albumin: 2.5 g/dL — ABNORMAL LOW (ref 3.5–5.0)

## 2024-05-01 LAB — MAGNESIUM: Magnesium: 2 mg/dL (ref 1.7–2.4)

## 2024-05-01 LAB — GLUCOSE, PLEURAL OR PERITONEAL FLUID: Glucose, Fluid: 118 mg/dL

## 2024-05-01 LAB — BRAIN NATRIURETIC PEPTIDE: B Natriuretic Peptide: 396 pg/mL — ABNORMAL HIGH (ref 0.0–100.0)

## 2024-05-01 LAB — PROTEIN, PLEURAL OR PERITONEAL FLUID: Total protein, fluid: <3 g/dL

## 2024-05-01 LAB — LACTATE DEHYDROGENASE: LDH: 180 U/L (ref 105–235)

## 2024-05-01 MED ORDER — POTASSIUM CHLORIDE CRYS ER 20 MEQ PO TBCR
40.0000 meq | EXTENDED_RELEASE_TABLET | ORAL | Status: AC
  Administered 2024-05-01 (×2): 40 meq via ORAL
  Filled 2024-05-01 (×2): qty 2

## 2024-05-01 MED ORDER — HYDROMORPHONE HCL-NACL 50-0.9 MG/50ML-% IV SOLN: 0.5000 mg/h | INTRAVENOUS | Status: DC

## 2024-05-01 MED ORDER — HYDROMORPHONE HCL 1 MG/ML IJ SOLN
INTRAMUSCULAR | Status: AC
  Administered 2024-05-01: 0.5 mg
  Filled 2024-05-01: qty 0.5

## 2024-05-01 MED ORDER — BUPIVACAINE HCL (PF) 0.5 % IJ SOLN
30.0000 mL | Freq: Once | INTRAMUSCULAR | Status: AC
  Administered 2024-05-01: 30 mL
  Filled 2024-05-01 (×2): qty 30

## 2024-05-01 MED ORDER — HYDROMORPHONE HCL 1 MG/ML IJ SOLN: 0.5000 mg | Freq: Once | INTRAMUSCULAR | Status: AC

## 2024-05-01 MED ORDER — AMIODARONE HCL IN DEXTROSE 360-4.14 MG/200ML-% IV SOLN
60.0000 mg/h | INTRAVENOUS | Status: AC
  Administered 2024-05-01: 60 mg/h via INTRAVENOUS
  Filled 2024-05-01 (×2): qty 200

## 2024-05-01 MED ORDER — FUROSEMIDE 10 MG/ML IJ SOLN
20.0000 mg | Freq: Two times a day (BID) | INTRAMUSCULAR | Status: DC
  Administered 2024-05-01 – 2024-05-03 (×5): 20 mg via INTRAVENOUS
  Filled 2024-05-01 (×5): qty 2

## 2024-05-01 MED ORDER — AMIODARONE HCL IN DEXTROSE 360-4.14 MG/200ML-% IV SOLN
60.0000 mg/h | INTRAVENOUS | Status: DC
  Administered 2024-05-01: 30 mg/h via INTRAVENOUS
  Administered 2024-05-02: 12:00:00 60 mg/h via INTRAVENOUS
  Administered 2024-05-02: 02:00:00 30 mg/h via INTRAVENOUS
  Administered 2024-05-02 – 2024-05-04 (×7): 60 mg/h via INTRAVENOUS
  Filled 2024-05-01 (×10): qty 200

## 2024-05-01 NOTE — Progress Notes (Addendum)
 PROGRESS NOTE    Carla Petersen  FMW:[id number] DOB: Jun 03, 1940 DOA: 04/29/2024 PCP: Ransom Other, MD   Brief Narrative:  Carla Petersen is a 83 y.o. female with medical history significant of CHF, Graves who presented to the emergency department due to heart palpitations -noted to be in A-fib/flutter with worsening shortness of breath orthopnea and dyspnea with exertion consistent with heart failure exacerbation.  Cardiology called in consult, hospitalist called to admit.  Assessment & Plan:   Principal Problem:   Acute exacerbation of CHF (congestive heart failure) (HCC) Active Problems:   Acute on chronic diastolic heart failure (HCC)   Acute respiratory failure with hypoxia (HCC)   Atrial fibrillation with RVR (HCC)  Acute heart failure exacerbation, preserved ejection fraction -Cardiology following, appreciate insight recommendations -Current goal-directed medical therapy limited by patient's hypotension -Lasix  dose decreased per cardiology - Plan for right thoracentesis per discussion with IR and cardiology - IR unable to perform thora today due to staffing - Dr Laurence to evaluate for possible bedside thora this afternoon. - Tachycardia ongoing, continue amiodarone  - Eliquis  on hold for thoracentesis, plan to restart tonight  - Repeat echocardiogram 12/13 shows EF greater than 75% with hyperdynamic function indeterminate diastolic parameters  Acute hypoxic respiratory failure secondary to above, ongoing -Chest x-ray notable for bilateral pleural effusions, diuretics ongoing as above -Oxygen supplementation initially 6 L nasal cannula -wean as appropriate and tolerated   Paroxysmal A-fib -currently rate controlled, continue Eliquis , amiodarone  Hyperlipidemia-continue Lipitor Hypertension -on metoprolol  and Lasix  as above, holding parameters given hypotension   Hypothyroidism - Continue levothyroxine  Hypokalemia-repleted  DVT prophylaxis: SCDs Start: 04/29/24 2013 apixaban   (ELIQUIS ) tablet 5 mg   Code Status:   Code Status: Full Code  Family Communication: None present  Status is: Inpatient  Dispo: The patient is from: Home              Anticipated d/c is to: Home              Anticipated d/c date is: 48 to 72 hours              Patient currently not medically stable for discharge  Consultants:  Cardiology  Procedures:  None  Antimicrobials:  None  Subjective: No acute issues or events overnight, respiratory status stable, plan for thoracentesis as above  Objective: Vitals:   05/01/24 0001 05/01/24 0100 05/01/24 0342 05/01/24 0720  BP: (!) 91/55 100/66 93/61   Pulse: (!) 104 (!) 109 (!) 112   Resp: 20 19 (!) 27   Temp:   98.1 F (36.7 C) 97.9 F (36.6 C)  TempSrc:   Oral Oral  SpO2: 92% 92% 91%   Weight:   75.7 kg   Height:        Intake/Output Summary (Last 24 hours) at 05/01/2024 0738 Last data filed at 05/01/2024 9391 Gross per 24 hour  Intake 5.89 ml  Output 1250 ml  Net -1244.11 ml   Filed Weights   04/30/24 1246 05/01/24 0342  Weight: 74 kg 75.7 kg    Examination:  General:  Pleasantly resting in bed, No acute distress. HEENT:  Normocephalic atraumatic.  Sclerae nonicteric, noninjected.  Extraocular movements intact bilaterally. Neck:  Without mass or deformity.  Trachea is midline. Lungs: Bibasilar rales. Heart:  Regular rate and rhythm.  Without murmurs, rubs, or gallops. Abdomen:  Soft, nontender, nondistended.  Without guarding or rebound. Extremities: Without cyanosis, clubbing, 1-2+ pitting edema bilateral lower extremities. Skin:  Warm and dry: noted pressure injury Wound 04/30/24  1250 Pressure Injury Buttocks Medial;Bilateral Stage 1 -  Intact skin with non-blanchable redness of a localized area usually over a bony prominence. (Active)   Data Reviewed: I have personally reviewed following labs and imaging studies  CBC: Recent Labs  Lab 04/29/24 1546 04/29/24 1713 04/30/24 0342  WBC 12.5*  --  9.6   HGB 13.6 14.6  14.3 12.6  HCT 42.4 43.0  42.0 39.9  MCV 96.4  --  96.1  PLT 234  --  230   Basic Metabolic Panel: Recent Labs  Lab 04/29/24 1546 04/29/24 1713 04/29/24 1957 04/30/24 0342 05/01/24 0325  NA 139 142  140 140 141 139  K 3.3* 2.7*  2.7* 3.7 4.1 3.1*  CL 93* 94* 95* 99 91*  CO2 35*  --  34* 34* 36*  GLUCOSE 119* 120* 112* 112* 112*  BUN 23 26* 22 18 17   CREATININE 1.24* 1.30* 1.13* 1.12* 1.14*  CALCIUM  8.9  --  8.6* 8.5* 8.1*  MG  --   --  2.6* 2.1 2.0   GFR: Estimated Creatinine Clearance: 35.1 mL/min (A) (by C-G formula based on SCr of 1.14 mg/dL (H)).  Coagulation Profile: Recent Labs  Lab 04/30/24 0342  INR 2.3*   Sepsis Labs: Recent Labs  Lab 04/29/24 1725  LATICACIDVEN 1.7   Recent Results (from the past 240 hours)  MRSA Next Gen by PCR, Nasal     Status: None   Collection Time: 04/30/24 12:50 PM   Specimen: Nasal Mucosa; Nasal Swab  Result Value Ref Range Status   MRSA by PCR Next Gen NOT DETECTED NOT DETECTED Final    Comment: (NOTE) The GeneXpert MRSA Assay (FDA approved for NASAL specimens only), is one component of a comprehensive MRSA colonization surveillance program. It is not intended to diagnose MRSA infection nor to guide or monitor treatment for MRSA infections. Test performance is not FDA approved in patients less than 59 years old. Performed at [facility], [address], [place], [place] [address]      Radiology Studies: ECHOCARDIOGRAM LIMITED Result Date: 04/30/2024    ECHOCARDIOGRAM LIMITED REPORT   Patient Name:   Carla Petersen Date of Exam: 04/30/2024 Medical Rec #:  [id number]      Height:       63.0 in Accession #:    [id number]     Weight:       163.1 lb Date of Birth:  1941/05/16       BSA:          1.773 m Patient Age:    83 years       BP:           117/64 mmHg Patient Gender: F              HR:           110 bpm. Exam Location:  Inpatient Procedure: Limited Color Doppler, Cardiac Doppler and Limited Echo  (Both            Spectral and Color Flow Doppler were utilized during procedure). Indications:    acute systolic chf  History:        Patient has prior history of Echocardiogram examinations, most                 recent 10/02/2022. CHF, Arrythmias:Atrial Fibrillation; Risk                 Factors:Sleep Apnea.  Sonographer:    Tinnie Barefoot RDCS Referring Phys:  [id number] ROBERT DORRELL IMPRESSIONS  1. Left ventricular ejection fraction, by estimation, is >75%. The left ventricle has hyperdynamic function. The left ventricle has no regional wall motion abnormalities. Left ventricular diastolic parameters are indeterminate.  2. Right ventricular systolic function is normal. The right ventricular size is normal. There is mildly elevated pulmonary artery systolic pressure.  3. Left atrial size was severely dilated.  4. Right atrial size was mildly dilated.  5. Moderate pleural effusion in the left lateral region.  6. The mitral valve is normal in structure. Mild to moderate mitral valve regurgitation. No evidence of mitral stenosis.  7. The aortic valve is calcified. Aortic valve regurgitation is not visualized. Mild aortic valve stenosis.  8. The inferior vena cava is dilated in size with >50% respiratory variability, suggesting right atrial pressure of 8 mmHg. FINDINGS  Left Ventricle: Left ventricular ejection fraction, by estimation, is >75%. The left ventricle has hyperdynamic function. The left ventricle has no regional wall motion abnormalities. The left ventricular internal cavity size was normal in size. There is no left ventricular hypertrophy. Left ventricular diastolic parameters are indeterminate. Left ventricular diastolic function could not be evaluated due to atrial fibrillation. Right Ventricle: The right ventricular size is normal. Right ventricular systolic function is normal. There is mildly elevated pulmonary artery systolic pressure. The tricuspid regurgitant velocity is 2.89 m/s, and with an  assumed right atrial pressure of 8 mmHg, the estimated right ventricular systolic pressure is 41.4 mmHg. Left Atrium: Left atrial size was severely dilated. Right Atrium: Right atrial size was mildly dilated. Pericardium: There is no evidence of pericardial effusion. Mitral Valve: The mitral valve is normal in structure. Mild mitral annular calcification. Mild to moderate mitral valve regurgitation. No evidence of mitral valve stenosis. Tricuspid Valve: The tricuspid valve is normal in structure. Tricuspid valve regurgitation is mild . No evidence of tricuspid stenosis. Aortic Valve: The aortic valve is calcified. Aortic valve regurgitation is not visualized. Mild aortic stenosis is present. Aortic valve mean gradient measures 12.2 mmHg. Aortic valve peak gradient measures 22.0 mmHg. Pulmonic Valve: The pulmonic valve was normal in structure. Pulmonic valve regurgitation is not visualized. No evidence of pulmonic stenosis. Aorta: The aortic root is normal in size and structure. Venous: The inferior vena cava is dilated in size with greater than 50% respiratory variability, suggesting right atrial pressure of 8 mmHg. IAS/Shunts: No atrial level shunt detected by color flow Doppler. Additional Comments: There is a moderate pleural effusion in the left lateral region. Spectral Doppler performed. Color Doppler performed.  LEFT VENTRICLE PLAX 2D LVIDd:         4.30 cm LVIDs:         2.60 cm  IVC IVC diam: 2.20 cm AORTIC VALVE AV Vmax:           234.50 cm/s AV Vmean:          160.250 cm/s AV VTI:            0.361 m AV Peak Grad:      22.0 mmHg AV Mean Grad:      12.2 mmHg LVOT Vmax:         98.25 cm/s LVOT Vmean:        63.900 cm/s LVOT VTI:          0.176 m LVOT/AV VTI ratio: 0.49  AORTA Ao Asc diam: 3.00 cm TRICUSPID VALVE TR Peak grad:   33.4 mmHg TR Vmax:        289.00 cm/s  SHUNTS Systemic  VTI: 0.18 m Redell Shallow MD Electronically signed by Redell Shallow MD Signature Date/Time: 04/30/2024/1:57:13 PM    Final     CT CHEST WO CONTRAST Result Date: 04/29/2024 EXAM: CT CHEST WITHOUT CONTRAST 04/29/2024 07:00:00 PM TECHNIQUE: CT of the chest was performed without the administration of intravenous contrast. Multiplanar reformatted images are provided for review. Automated exposure control, iterative reconstruction, and/or weight based adjustment of the mA/kV was utilized to reduce the radiation dose to as low as reasonably achievable. COMPARISON: None available. CLINICAL HISTORY: Dyspnea, chronic, chest wall or pleura disease suspected. FINDINGS: MEDIASTINUM: Mild cardiomegaly with small pericardial effusion. Scattered coronary calcifications. The central airways are clear. Scattered calcified plaque in descending thoracic aorta and arch. Dilated central pulmonary arteries suggesting pulmonary hypertension. LYMPH NODES: Mildly prominent mediastinal nodes, likely reactive. No hilar or axillary lymphadenopathy. LUNGS AND PLEURA: Moderate bilateral pleural effusions. Associated lingular and bilateral lower lobe compressive atelectasis. Patchy ground-glass opacities in right upper lobe, favoring infection/inflammation over mild edema. Coarse interstitial markings in periphery of both lung bases, suggesting mild interstitial lung disease. No pneumothorax. SOFT TISSUES/BONES: Bilateral shoulder DJD (degenerative joint disease). No acute abnormality of the bones or soft tissues. UPPER ABDOMEN: Small volume perihepatic ascites. Cirrhosis. IMPRESSION: 1. Patchy ground-glass opacities in the right upper lobe, favoring infection/inflammation over mild edema. 2. Moderate bilateral pleural effusions with associated lingular and bilateral lower lobe compressive atelectasis. 3. Mild cardiomegaly with small pericardial effusion. 4. Dilated central pulmonary arteries suggesting pulmonary hypertension. Electronically signed by: Pinkie Pebbles MD 04/29/2024 07:36 PM EST RP Workstation: [id number]   DG Chest Portable 1 View Result Date:  04/29/2024 EXAM: 1 VIEW(S) XRAY OF THE CHEST 04/29/2024 04:07:00 PM COMPARISON: 07/22/2022 CLINICAL HISTORY: SOB FINDINGS: LUNGS AND PLEURA: Small to moderate right and small left pleural effusions. Increased diffuse interstitial opacities. Hazy opacities at bilateral lung bases. No pneumothorax. HEART AND MEDIASTINUM: Cardiomegaly. Atherosclerotic calcifications. BONES AND SOFT TISSUES: No acute osseous abnormality. IMPRESSION: 1. Small to moderate right and small left pleural effusions. 2. Increased diffuse interstitial opacities and hazy opacities at the lung bases, which may reflect pulmonary edema. 3. Cardiomegaly. Electronically signed by: Dayne Hassell MD 04/29/2024 04:31 PM EST RP Workstation: [id number]   Scheduled Meds:  apixaban   5 mg Oral BID   atorvastatin   20 mg Oral Daily   furosemide   40 mg Intravenous BID   levothyroxine   25 mcg Oral Q0600   metoprolol  tartrate  25 mg Oral BID   potassium chloride   40 mEq Oral Q4H   Continuous Infusions:     LOS: 2 days   Time spent:  Elsie JAYSON Mont[place], DO [facility]  If 7PM-7AM, please contact night-coverage [email or web address]  05/01/2024, 7:38 AM

## 2024-05-01 NOTE — Progress Notes (Signed)
 Rounding Note   Patient Name: Carla Petersen Date of Encounter: 05/01/2024  [facility] Cardiologist: Lonni LITTIE Nanas, MD   Subjective I/Os incomplete but recorded as -1.2 L yesterday.  Creatinine stable at 1.1.  Potassium 3.1.  BP 93/61.  On 10 L high flow nasal cannula.  Evening Lasix  dose and metoprolol  were held due to low BP.  She reports feeling short of breath  Scheduled Meds:  apixaban   5 mg Oral BID   atorvastatin   20 mg Oral Daily   furosemide   40 mg Intravenous BID   levothyroxine   25 mcg Oral Q0600   metoprolol  tartrate  25 mg Oral BID   potassium chloride   40 mEq Oral Q4H   Continuous Infusions:   PRN Meds: acetaminophen  **OR** acetaminophen , ondansetron  **OR** ondansetron  (ZOFRAN ) IV, mouth rinse   Vital Signs  Vitals:   04/30/24 2339 05/01/24 0001 05/01/24 0100 05/01/24 0342  BP:  (!) 91/55 100/66 93/61  Pulse: (!) 104 (!) 104 (!) 109 (!) 112  Resp: 18 20 19  (!) 27  Temp:    98.1 F (36.7 C)  TempSrc:    Oral  SpO2: 91% 92% 92% 91%  Weight:    75.7 kg  Height:        Intake/Output Summary (Last 24 hours) at 05/01/2024 0714 Last data filed at 05/01/2024 9391 Gross per 24 hour  Intake 5.89 ml  Output 1250 ml  Net -1244.11 ml      05/01/2024    3:42 AM 04/30/2024   12:46 PM 04/29/2024    2:19 PM  Last 3 Weights  Weight (lbs) 166 lb 14.2 oz 163 lb 2.3 oz 180 lb 6.4 oz  Weight (kg) 75.7 kg 74 kg 81.829 kg      Telemetry Afib 120-130s - Personally Reviewed  ECG  A-fib, rate 109, QTc 463- Personally Reviewed  Physical Exam  GEN: No acute distress.   Neck: + JVD Cardiac: irregular, tachycardic, no murmurs Respiratory: Bibasilar crackles GI: Soft MS: 1+ edema Neuro:  Nonfocal  Psych: Normal affect   Labs High Sensitivity Troponin:   Recent Labs  Lab 04/29/24 1624 04/29/24 1957  TROPONINIHS 7 8   No results for input(s): TRNPT in the last 720 hours.     Chemistry Recent Labs  Lab 04/29/24 1957  04/30/24 0342 04/30/24 0815 05/01/24 0325  NA 140 141  --  139  K 3.7 4.1  --  3.1*  CL 95* 99  --  91*  CO2 34* 34*  --  36*  GLUCOSE 112* 112*  --  112*  BUN 22 18  --  17  CREATININE 1.13* 1.12*  --  1.14*  CALCIUM  8.6* 8.5*  --  8.1*  MG 2.6* 2.1  --  2.0  PROT  --   --  6.4*  --   ALBUMIN  --   --  2.5*  --   AST  --   --  21  --   ALT  --   --  17  --   ALKPHOS  --   --  128*  --   BILITOT  --   --  1.1  --   GFRNONAA 48* 48*  --  47*  ANIONGAP 11 8  --  12    Lipids No results for input(s): CHOL, TRIG, HDL, LABVLDL, LDLCALC, CHOLHDL in the last 168 hours.  Hematology Recent Labs  Lab 04/29/24 1546 04/29/24 1713 04/30/24 0342  WBC 12.5*  --  9.6  RBC 4.40  --  4.15  HGB 13.6 14.6  14.3 12.6  HCT 42.4 43.0  42.0 39.9  MCV 96.4  --  96.1  MCH 30.9  --  30.4  MCHC 32.1  --  31.6  RDW 14.3  --  14.6  PLT 234  --  230   Thyroid  No results for input(s): TSH, FREET4 in the last 168 hours.  BNP Recent Labs  Lab 04/29/24 1546 05/01/24 0325  BNP 584.1* 396.0*    DDimer No results for input(s): DDIMER in the last 168 hours.   Radiology  ECHOCARDIOGRAM LIMITED Result Date: 04/30/2024    ECHOCARDIOGRAM LIMITED REPORT   Patient Name:   Carla Petersen Date of Exam: 04/30/2024 Medical Rec #:  [id number]      Height:       63.0 in Accession #:    [id number]     Weight:       163.1 lb Date of Birth:  1940-08-22       BSA:          1.773 m Patient Age:    83 years       BP:           117/64 mmHg Patient Gender: F              HR:           110 bpm. Exam Location:  Inpatient Procedure: Limited Color Doppler, Cardiac Doppler and Limited Echo (Both            Spectral and Color Flow Doppler were utilized during procedure). Indications:    acute systolic chf  History:        Patient has prior history of Echocardiogram examinations, most                 recent 10/02/2022. CHF, Arrythmias:Atrial Fibrillation; Risk                 Factors:Sleep Apnea.  Sonographer:     Tinnie Barefoot RDCS Referring Phys: [id number] ROBERT DORRELL IMPRESSIONS  1. Left ventricular ejection fraction, by estimation, is >75%. The left ventricle has hyperdynamic function. The left ventricle has no regional wall motion abnormalities. Left ventricular diastolic parameters are indeterminate.  2. Right ventricular systolic function is normal. The right ventricular size is normal. There is mildly elevated pulmonary artery systolic pressure.  3. Left atrial size was severely dilated.  4. Right atrial size was mildly dilated.  5. Moderate pleural effusion in the left lateral region.  6. The mitral valve is normal in structure. Mild to moderate mitral valve regurgitation. No evidence of mitral stenosis.  7. The aortic valve is calcified. Aortic valve regurgitation is not visualized. Mild aortic valve stenosis.  8. The inferior vena cava is dilated in size with >50% respiratory variability, suggesting right atrial pressure of 8 mmHg. FINDINGS  Left Ventricle: Left ventricular ejection fraction, by estimation, is >75%. The left ventricle has hyperdynamic function. The left ventricle has no regional wall motion abnormalities. The left ventricular internal cavity size was normal in size. There is no left ventricular hypertrophy. Left ventricular diastolic parameters are indeterminate. Left ventricular diastolic function could not be evaluated due to atrial fibrillation. Right Ventricle: The right ventricular size is normal. Right ventricular systolic function is normal. There is mildly elevated pulmonary artery systolic pressure. The tricuspid regurgitant velocity is 2.89 m/s, and with an assumed right atrial pressure of 8 mmHg, the estimated right ventricular systolic pressure is 41.4 mmHg. Left  Atrium: Left atrial size was severely dilated. Right Atrium: Right atrial size was mildly dilated. Pericardium: There is no evidence of pericardial effusion. Mitral Valve: The mitral valve is normal in structure. Mild  mitral annular calcification. Mild to moderate mitral valve regurgitation. No evidence of mitral valve stenosis. Tricuspid Valve: The tricuspid valve is normal in structure. Tricuspid valve regurgitation is mild . No evidence of tricuspid stenosis. Aortic Valve: The aortic valve is calcified. Aortic valve regurgitation is not visualized. Mild aortic stenosis is present. Aortic valve mean gradient measures 12.2 mmHg. Aortic valve peak gradient measures 22.0 mmHg. Pulmonic Valve: The pulmonic valve was normal in structure. Pulmonic valve regurgitation is not visualized. No evidence of pulmonic stenosis. Aorta: The aortic root is normal in size and structure. Venous: The inferior vena cava is dilated in size with greater than 50% respiratory variability, suggesting right atrial pressure of 8 mmHg. IAS/Shunts: No atrial level shunt detected by color flow Doppler. Additional Comments: There is a moderate pleural effusion in the left lateral region. Spectral Doppler performed. Color Doppler performed.  LEFT VENTRICLE PLAX 2D LVIDd:         4.30 cm LVIDs:         2.60 cm  IVC IVC diam: 2.20 cm AORTIC VALVE AV Vmax:           234.50 cm/s AV Vmean:          160.250 cm/s AV VTI:            0.361 m AV Peak Grad:      22.0 mmHg AV Mean Grad:      12.2 mmHg LVOT Vmax:         98.25 cm/s LVOT Vmean:        63.900 cm/s LVOT VTI:          0.176 m LVOT/AV VTI ratio: 0.49  AORTA Ao Asc diam: 3.00 cm TRICUSPID VALVE TR Peak grad:   33.4 mmHg TR Vmax:        289.00 cm/s  SHUNTS Systemic VTI: 0.18 m Redell Shallow MD Electronically signed by Redell Shallow MD Signature Date/Time: 04/30/2024/1:57:13 PM    Final    CT CHEST WO CONTRAST Result Date: 04/29/2024 EXAM: CT CHEST WITHOUT CONTRAST 04/29/2024 07:00:00 PM TECHNIQUE: CT of the chest was performed without the administration of intravenous contrast. Multiplanar reformatted images are provided for review. Automated exposure control, iterative reconstruction, and/or weight based  adjustment of the mA/kV was utilized to reduce the radiation dose to as low as reasonably achievable. COMPARISON: None available. CLINICAL HISTORY: Dyspnea, chronic, chest wall or pleura disease suspected. FINDINGS: MEDIASTINUM: Mild cardiomegaly with small pericardial effusion. Scattered coronary calcifications. The central airways are clear. Scattered calcified plaque in descending thoracic aorta and arch. Dilated central pulmonary arteries suggesting pulmonary hypertension. LYMPH NODES: Mildly prominent mediastinal nodes, likely reactive. No hilar or axillary lymphadenopathy. LUNGS AND PLEURA: Moderate bilateral pleural effusions. Associated lingular and bilateral lower lobe compressive atelectasis. Patchy ground-glass opacities in right upper lobe, favoring infection/inflammation over mild edema. Coarse interstitial markings in periphery of both lung bases, suggesting mild interstitial lung disease. No pneumothorax. SOFT TISSUES/BONES: Bilateral shoulder DJD (degenerative joint disease). No acute abnormality of the bones or soft tissues. UPPER ABDOMEN: Small volume perihepatic ascites. Cirrhosis. IMPRESSION: 1. Patchy ground-glass opacities in the right upper lobe, favoring infection/inflammation over mild edema. 2. Moderate bilateral pleural effusions with associated lingular and bilateral lower lobe compressive atelectasis. 3. Mild cardiomegaly with small pericardial effusion. 4. Dilated central pulmonary arteries suggesting  pulmonary hypertension. Electronically signed by: Pinkie Pebbles MD 04/29/2024 07:36 PM EST RP Workstation: [id number]   DG Chest Portable 1 View Result Date: 04/29/2024 EXAM: 1 VIEW(S) XRAY OF THE CHEST 04/29/2024 04:07:00 PM COMPARISON: 07/22/2022 CLINICAL HISTORY: SOB FINDINGS: LUNGS AND PLEURA: Small to moderate right and small left pleural effusions. Increased diffuse interstitial opacities. Hazy opacities at bilateral lung bases. No pneumothorax. HEART AND MEDIASTINUM:  Cardiomegaly. Atherosclerotic calcifications. BONES AND SOFT TISSUES: No acute osseous abnormality. IMPRESSION: 1. Small to moderate right and small left pleural effusions. 2. Increased diffuse interstitial opacities and hazy opacities at the lung bases, which may reflect pulmonary edema. 3. Cardiomegaly. Electronically signed by: Dayne Hassell MD 04/29/2024 04:31 PM EST RP Workstation: [id number]    Cardiac Studies   Patient Profile   83 y.o. female with Graves' disease, hyperlipidemia, prediabetes, breast cancer, HFpEF, persistent atrial fibrillation who we are consulted for evaluation of heart failure.  She was admitted 04/12/2024 at [facility], presented with shortness of breath and palpitations and found to be in A-fib with RVR.  SpO2 down to 83%.  She was treated with IV Lasix  as well as antibiotics.  She underwent cardioversion on 04/18/2024 remains in A-fib.  Ultimately was started on amiodarone  drip and then transition to oral amiodarone .  She received IV Lasix  throughout hospitalization.  Echo showed EF 55 to 60%.  She was discharged on amiodarone  200 mg twice daily.  She presented for follow-up appointment in clinic 12/12, appeared volume overloaded and was sent to ED.  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 Echo with EF >75%, normal RV function, mild AS -Diuresing with IV lasix .  Lasix  dose held yesterday evening due to soft BP.  Appears this was an issue during her recent hospitalization as well, BP would drop with Lasix .  She continues to appear volume overloaded on exam.  Will try smaller bolus dosing, reduce to IV Lasix  20 mg twice daily.  She is on 10 L HFNC and has significant pleural effusions, suspect diuresis will be insufficient to treat effusions given limited by BP.  Recommend therapeutic thoracentesis  Pleural effusions: CT chest shows moderate bilateral pleural  effusions.  Recommend thoracentesis as above  Persistent atrial fibrillation: Underwent unsuccessful cardioversion for A-fib 12/1 and was discharged on p.o. amiodarone .  On admission was back in sinus rhythm but has converted back to A-fib on 12/12 - Amiodarone  discontinued given prolonged QTc on admission.  Suspect hypokalemia driving QT prolongation, as potassium 2.7 on presentation.  Repeat EKG on 12/13 showed QTc had improved to 463 once K was repleted.  She was started on metoprolol  but held yesterday due to low BP.  Rates are elevated to 120s to 130s today.  Given her soft BP that limits AV nodal blockers and considering improvement in QT with potassium repletion, recommend starting amiodarone  drip.  Will monitor QT and replete potassium for goal greater than 4, mag greater than 2 - Continue Eliquis  5 mg twice daily; can switch to heparin gtt if needs held for thoracentesis  Hypokalemia: 2.7 on presentation.  Replete, goal greater than 4  For questions or updates, please contact [facility] Please consult [email or web address] for contact info under    Signed, Lonni LITTIE Nanas, MD  05/01/2024, 7:14 AM

## 2024-05-01 NOTE — Plan of Care (Signed)

## 2024-05-01 NOTE — Procedures (Signed)
 Date of Procedure: 05/01/2024  3:30 PM Procedure: Right Ultrasound guided thoracentesis Operator: Camellia Door, DO  Indications for procedure: Pleural effusion  Description of procedure:  After verbal and written consent was obtained from the patient, the patient was placed in the upright sitting position.  2 patient Carla Petersen's are used due to the patient.  Preprocedural pause was performed by the nurse and myself.  The Right posterior chest wall was imaged with ultrasound.  Suitable pocket of fluid was found on theRight of the chest.  This area was marked with a marking pen.  Subsequently, this area was draped and prepped in the usual sterile fashion with 2% chlorhexidine.  The selected area was anesthetized with 15 milliliters of 0.5 % bupivacaine  without epinephrine .  Using a thoracentesis needle catheter combination, theRight pleural space was punctured on the first attempt. Bloody serous Pleural fluid was aspirated.  The catheter was advanced into the pleural space and the needle was removed in its entirety.  The catheter was connected to a three-way stopcock.  Subsequently,1000 mL of bloody serous pleural fluid was hand aspirated using a 50 mL syringe.  Catheter was removed in its entirety with the tip visualized.  Sterile Band-Aid was placed over the puncture site.  The patient tolerated the procedure well without complication or difficulty.  A stat portable chest x-ray will be ordered to rule out pneumothorax.  Pleural fluid will be sent off for routine cytology, cell count, glucose, LDH, total protein, culture.  Estimated blood loss 0 ml.  Camellia Door, DO [facility]  05/01/2024 3:30 PM

## 2024-05-01 NOTE — Progress Notes (Signed)
°   05/01/24 1953  BiPAP/CPAP/SIPAP  Reason BIPAP/CPAP not in use Non-compliant;Other(comment) (Pt does not want to wear BiPAP tonight. Tolerating HFNC well.)  BiPAP/CPAP /SiPAP Vitals  Pulse Rate (!) 126  Resp (!) 23  SpO2 91 %  MEWS Score/Color  MEWS Score 3  MEWS Score Color Yellow

## 2024-05-02 ENCOUNTER — Inpatient Hospital Stay (HOSPITAL_COMMUNITY)

## 2024-05-02 DIAGNOSIS — I5033 Acute on chronic diastolic (congestive) heart failure: Secondary | ICD-10-CM | POA: Diagnosis not present

## 2024-05-02 DIAGNOSIS — I4891 Unspecified atrial fibrillation: Secondary | ICD-10-CM | POA: Diagnosis not present

## 2024-05-02 DIAGNOSIS — J9601 Acute respiratory failure with hypoxia: Secondary | ICD-10-CM | POA: Diagnosis not present

## 2024-05-02 DIAGNOSIS — R9431 Abnormal electrocardiogram [ECG] [EKG]: Secondary | ICD-10-CM | POA: Diagnosis not present

## 2024-05-02 LAB — BASIC METABOLIC PANEL WITH GFR
Anion gap: 5 (ref 5–15)
BUN: 16 mg/dL (ref 8–23)
CO2: 38 mmol/L — ABNORMAL HIGH (ref 22–32)
Calcium: 8.1 mg/dL — ABNORMAL LOW (ref 8.9–10.3)
Chloride: 95 mmol/L — ABNORMAL LOW (ref 98–111)
Creatinine, Ser: 1.2 mg/dL — ABNORMAL HIGH (ref 0.44–1.00)
GFR, Estimated: 44 mL/min — ABNORMAL LOW (ref >60)
Glucose, Bld: 97 mg/dL (ref 70–99)
Potassium: 3.7 mmol/L (ref 3.5–5.1)
Sodium: 138 mmol/L (ref 135–145)

## 2024-05-02 LAB — MAGNESIUM: Magnesium: 1.8 mg/dL (ref 1.7–2.4)

## 2024-05-02 MED ORDER — LIDOCAINE-EPINEPHRINE 1 %-1:100000 IJ SOLN
INTRAMUSCULAR | Status: AC
  Filled 2024-05-02: qty 1

## 2024-05-02 MED ORDER — POTASSIUM CHLORIDE CRYS ER 20 MEQ PO TBCR
30.0000 meq | EXTENDED_RELEASE_TABLET | Freq: Once | ORAL | Status: AC
  Administered 2024-05-02: 15:00:00 30 meq via ORAL
  Filled 2024-05-02: qty 1

## 2024-05-02 MED ORDER — HYALURONIDASE HUMAN 150 UNIT/ML IJ SOLN
150.0000 [IU] | Freq: Once | INTRAMUSCULAR | Status: AC
  Administered 2024-05-02: 22:00:00 150 [IU] via SUBCUTANEOUS
  Filled 2024-05-02: qty 1

## 2024-05-02 MED ORDER — APIXABAN 5 MG PO TABS
5.0000 mg | ORAL_TABLET | Freq: Two times a day (BID) | ORAL | Status: DC
  Administered 2024-05-02 – 2024-05-14 (×24): 5 mg via ORAL
  Filled 2024-05-02 (×24): qty 1

## 2024-05-02 MED ORDER — MAGNESIUM SULFATE 2 GM/50ML IV SOLN
2.0000 g | Freq: Once | INTRAVENOUS | Status: AC
  Administered 2024-05-02: 15:00:00 2 g via INTRAVENOUS
  Filled 2024-05-02: qty 50

## 2024-05-02 NOTE — Progress Notes (Signed)
 Rounding Note   Patient Name: Carla Petersen Date of Encounter: 05/02/2024  [facility] Cardiologist: Carla LITTIE Nanas, MD   Subjective - No acute events overnight.  No complaints today. - Was net -486 cc.  Remains on 10 L O2 nasal cannula - Underwent thoracentesis yesterday  Scheduled Meds:  atorvastatin   20 mg Oral Daily   furosemide   20 mg Intravenous BID   levothyroxine   25 mcg Oral Q0600   Continuous Infusions:  amiodarone  30 mg/hr (05/02/24 0130)   PRN Meds: acetaminophen  **OR** acetaminophen , ondansetron  **OR** ondansetron  (ZOFRAN ) IV, mouth rinse   Vital Signs  Vitals:   05/01/24 2330 05/02/24 0327 05/02/24 0400 05/02/24 0800  BP: 115/68  113/74 123/73  Pulse: (!) 120  (!) 115 (!) 119  Resp: 16 16 20 20   Temp: 98.1 F (36.7 C)  97.8 F (36.6 C) 97.7 F (36.5 C)  TempSrc: Oral  Oral Oral  SpO2: 93%  93% 90%  Weight:   72.7 kg   Height:        Intake/Output Summary (Last 24 hours) at 05/02/2024 0924 Last data filed at 05/02/2024 0411 Gross per 24 hour  Intake 543.28 ml  Output 650 ml  Net -106.72 ml      05/02/2024    4:00 AM 05/01/2024    3:42 AM 04/30/2024   12:46 PM  Last 3 Weights  Weight (lbs) 160 lb 4.4 oz 166 lb 14.2 oz 163 lb 2.3 oz  Weight (kg) 72.7 kg 75.7 kg 74 kg      Telemetry Atrial fibrillation with RVR- Personally Reviewed  ECG  Atrial fibrillation with RVR- Personally Reviewed  Physical Exam  GEN: No acute distress.   Neck: JVD elevated 2 cm above clavicle at 45 degrees Cardiac: Tachycardic with irregularly irregular rhythm, no murmurs, rubs, or gallops.  Respiratory: Bibasilar rales, no wheezes or rhonchi GI: Soft, nontender, non-distended  MS: No edema; No deformity. Neuro:  Nonfocal  Psych: Normal affect   Labs High Sensitivity Troponin:   Recent Labs  Lab 04/29/24 1624 04/29/24 1957  TROPONINIHS 7 8   No results for input(s): TRNPT in the last 720 hours.     Chemistry Recent Labs  Lab  04/30/24 0342 04/30/24 0815 05/01/24 0325 05/01/24 1745 05/02/24 0343  NA 141  --  139  --  138  K 4.1  --  3.1*  --  3.7  CL 99  --  91*  --  95*  CO2 34*  --  36*  --  38*  GLUCOSE 112*  --  112*  --  97  BUN 18  --  17  --  16  CREATININE 1.12*  --  1.14*  --  1.20*  CALCIUM  8.5*  --  8.1*  --  8.1*  MG 2.1  --  2.0  --  1.8  PROT  --  6.4*  --  6.7  --   ALBUMIN  --  2.5*  --  2.5*  --   AST  --  21  --   --   --   ALT  --  17  --   --   --   ALKPHOS  --  128*  --   --   --   BILITOT  --  1.1  --   --   --   GFRNONAA 48*  --  47*  --  44*  ANIONGAP 8  --  12  --  5    Lipids  No results for input(s): CHOL, TRIG, HDL, LABVLDL, LDLCALC, CHOLHDL in the last 168 hours.  Hematology Recent Labs  Lab 04/29/24 1546 04/29/24 1713 04/30/24 0342  WBC 12.5*  --  9.6  RBC 4.40  --  4.15  HGB 13.6 14.6  14.3 12.6  HCT 42.4 43.0  42.0 39.9  MCV 96.4  --  96.1  MCH 30.9  --  30.4  MCHC 32.1  --  31.6  RDW 14.3  --  14.6  PLT 234  --  230   Thyroid  No results for input(s): TSH, FREET4 in the last 168 hours.  BNP Recent Labs  Lab 04/29/24 1546 05/01/24 0325  BNP 584.1* 396.0*    DDimer No results for input(s): DDIMER in the last 168 hours.   Radiology  DG CHEST PORT 1 VIEW Result Date: 05/01/2024 CLINICAL DATA:  [id number] S/P thoracentesis [id number] EXAM: PORTABLE CHEST 1 VIEW COMPARISON:  May 01, 2024 FINDINGS: The cardiomediastinal silhouette is unchanged and enlarged in contour. Moderate LEFT pleural effusion. Decreased haziness overlying the RIGHT hemithorax consistent with decreased RIGHT-sided pleural effusion. No significant pneumothorax. Persistent LEFT retrocardiac homogeneous opacity. Background of diffuse interstitial prominence, likely pulmonary edema. Remote rib fractures. IMPRESSION: 1. Decreased RIGHT-sided pleural effusion status post thoracentesis. No significant pneumothorax. 2. Moderate LEFT pleural effusion with persistent LEFT  retrocardiac opacity, likely atelectasis. Electronically Signed   By: Corean Salter M.D.   On: 05/01/2024 18:50   DG CHEST PORT 1 VIEW Result Date: 05/01/2024 EXAM: 1 VIEW(S) XRAY OF THE CHEST 05/01/2024 07:13:00 AM COMPARISON: Portable chest and chest CT both 04/29/2024. CLINICAL HISTORY: Pleural effusion. FINDINGS: LUNGS AND PLEURA: Moderate pleural effusions with overlying atelectasis and patchy airspace disease of the upper to mid lung regions. No pneumothorax. HEART AND MEDIASTINUM: Cardiomegaly. There is worsening central vascular prominence and central interstitial edema. Stable mediastinum with aortic atherosclerosis. BONES AND SOFT TISSUES: No acute osseous abnormality. IMPRESSION: 1. Moderate pleural effusions with overlying atelectasis and patchy airspace disease of the upper to mid lung regions. 2. Cardiomegaly with worsening central vascular prominence and central interstitial edema. Electronically signed by: Francis Quam MD 05/01/2024 07:43 AM EST RP Workstation: [id number]   ECHOCARDIOGRAM LIMITED Result Date: 04/30/2024    ECHOCARDIOGRAM LIMITED REPORT   Patient Name:   Carla Petersen Date of Exam: 04/30/2024 Medical Rec #:  [id number]      Height:       63.0 in Accession #:    [id number]     Weight:       163.1 lb Date of Birth:  10-07-1940       BSA:          1.773 m Patient Age:    83 years       BP:           117/64 mmHg Patient Gender: F              HR:           110 bpm. Exam Location:  Inpatient Procedure: Limited Color Doppler, Cardiac Doppler and Limited Echo (Both            Spectral and Color Flow Doppler were utilized during procedure). Indications:    acute systolic chf  History:        Patient has prior history of Echocardiogram examinations, most                 recent 10/02/2022. CHF, Arrythmias:Atrial Fibrillation; Risk  Factors:Sleep Apnea.  Sonographer:    Tinnie Barefoot RDCS Referring Phys: [id number] ROBERT DORRELL IMPRESSIONS  1. Left ventricular  ejection fraction, by estimation, is >75%. The left ventricle has hyperdynamic function. The left ventricle has no regional wall motion abnormalities. Left ventricular diastolic parameters are indeterminate.  2. Right ventricular systolic function is normal. The right ventricular size is normal. There is mildly elevated pulmonary artery systolic pressure.  3. Left atrial size was severely dilated.  4. Right atrial size was mildly dilated.  5. Moderate pleural effusion in the left lateral region.  6. The mitral valve is normal in structure. Mild to moderate mitral valve regurgitation. No evidence of mitral stenosis.  7. The aortic valve is calcified. Aortic valve regurgitation is not visualized. Mild aortic valve stenosis.  8. The inferior vena cava is dilated in size with >50% respiratory variability, suggesting right atrial pressure of 8 mmHg. FINDINGS  Left Ventricle: Left ventricular ejection fraction, by estimation, is >75%. The left ventricle has hyperdynamic function. The left ventricle has no regional wall motion abnormalities. The left ventricular internal cavity size was normal in size. There is no left ventricular hypertrophy. Left ventricular diastolic parameters are indeterminate. Left ventricular diastolic function could not be evaluated due to atrial fibrillation. Right Ventricle: The right ventricular size is normal. Right ventricular systolic function is normal. There is mildly elevated pulmonary artery systolic pressure. The tricuspid regurgitant velocity is 2.89 m/s, and with an assumed right atrial pressure of 8 mmHg, the estimated right ventricular systolic pressure is 41.4 mmHg. Left Atrium: Left atrial size was severely dilated. Right Atrium: Right atrial size was mildly dilated. Pericardium: There is no evidence of pericardial effusion. Mitral Valve: The mitral valve is normal in structure. Mild mitral annular calcification. Mild to moderate mitral valve regurgitation. No evidence of mitral  valve stenosis. Tricuspid Valve: The tricuspid valve is normal in structure. Tricuspid valve regurgitation is mild . No evidence of tricuspid stenosis. Aortic Valve: The aortic valve is calcified. Aortic valve regurgitation is not visualized. Mild aortic stenosis is present. Aortic valve mean gradient measures 12.2 mmHg. Aortic valve peak gradient measures 22.0 mmHg. Pulmonic Valve: The pulmonic valve was normal in structure. Pulmonic valve regurgitation is not visualized. No evidence of pulmonic stenosis. Aorta: The aortic root is normal in size and structure. Venous: The inferior vena cava is dilated in size with greater than 50% respiratory variability, suggesting right atrial pressure of 8 mmHg. IAS/Shunts: No atrial level shunt detected by color flow Doppler. Additional Comments: There is a moderate pleural effusion in the left lateral region. Spectral Doppler performed. Color Doppler performed.  LEFT VENTRICLE PLAX 2D LVIDd:         4.30 cm LVIDs:         2.60 cm  IVC IVC diam: 2.20 cm AORTIC VALVE AV Vmax:           234.50 cm/s AV Vmean:          160.250 cm/s AV VTI:            0.361 m AV Peak Grad:      22.0 mmHg AV Mean Grad:      12.2 mmHg LVOT Vmax:         98.25 cm/s LVOT Vmean:        63.900 cm/s LVOT VTI:          0.176 m LVOT/AV VTI ratio: 0.49  AORTA Ao Asc diam: 3.00 cm TRICUSPID VALVE TR Peak grad:   33.4 mmHg TR  Vmax:        289.00 cm/s  SHUNTS Systemic VTI: 0.18 m Redell Shallow MD Electronically signed by Redell Shallow MD Signature Date/Time: 04/30/2024/1:57:13 PM    Final     Cardiac Studies   TTE 04/30/24:  IMPRESSIONS     1. Left ventricular ejection fraction, by estimation, is >75%. The left  ventricle has hyperdynamic function. The left ventricle has no regional  wall motion abnormalities. Left ventricular diastolic parameters are  indeterminate.   2. Right ventricular systolic function is normal. The right ventricular  size is normal. There is mildly elevated pulmonary  artery systolic  pressure.   3. Left atrial size was severely dilated.   4. Right atrial size was mildly dilated.   5. Moderate pleural effusion in the left lateral region.   6. The mitral valve is normal in structure. Mild to moderate mitral valve  regurgitation. No evidence of mitral stenosis.   7. The aortic valve is calcified. Aortic valve regurgitation is not  visualized. Mild aortic valve stenosis.   8. The inferior vena cava is dilated in size with >50% respiratory  variability, suggesting right atrial pressure of 8 mmHg.   Patient Profile   Ms. Robards is a 83 y.o. female with a PMH of persistent AF, HFpEF, HLD, Graves' disease who was admitted for acute hypoxic respiratory failure in the setting of acute on chronic HFpEF exacerbation and atrial fibrillation with RVR.  Cardiology was consulted for the evaluation of her cardiovascular issues.  Assessment & Plan    #Acute on Chronic HFpEF Exacerbation - Patient admitted with volume overload in the setting of decompensated heart failure. - Diuresis has been challenging due to low blood pressures. - Ultimately the patient remains volume overloaded and does need to get additional fluid removed. - Will continue her current Lasix  dosing, but will try to push the dose if it is inadequate. Continue IV Lasix  20 mg twice daily for now Strict I's and O's, daily weights S/p thoracentesis R 12/14, going for left thoracentesis today  #Atrial Fibrillation with RVR - Patient found to be in atrial fibrillation with RVR on admission. - She underwent unsuccessful DCCV on 12/1 and was discharged at an outside hospital on p.o. Amio. - Currently she is tolerating IV amiodarone  well; however, she remains in A-fib with RVR. - Ultimately she may need repeat DCCV after completion of her IV amiodarone  load, but we will see if the amiodarone  can do the trick for her. - I spoke with primary team and they are okay with us  restarting Eliquis  later this  evening post thoracentesis. Increase IV amiodarone  to 60 mg/hr Restart Eliquis  tonight May ultimately need repeat DCCV, but will need a TEE first given interruptions in her anticoagulation status.  Given that she is still hypoxic I am not thrilled about the idea of her undergoing that procedure at this time. Maintain K>4, Mg>2 Maintain telemetry Daily ECG for QTc monitoring      For questions or updates, please contact [facility] Please consult [email or web address] for contact info under       Signed, Georganna Archer, MD  05/02/2024, 9:24 AM

## 2024-05-02 NOTE — Plan of Care (Signed)
°  Problem: Clinical Measurements: Goal: Diagnostic test results will improve Outcome: Progressing Goal: Respiratory complications will improve Outcome: Progressing   Problem: Coping: Goal: Level of anxiety will decrease Outcome: Progressing   Problem: Pain Managment: Goal: General experience of comfort will improve and/or be controlled Outcome: Progressing   Problem: Safety: Goal: Ability to remain free from injury will improve Outcome: Progressing

## 2024-05-02 NOTE — Progress Notes (Signed)
 Patient presents therapeutic left sided thoracentesis. US  limited chest shows small amount of left sided pleural fluid After discussion of the risks versus the benefits of the procedure the Patient elected to defer the left sided thoracentesis at this time.  Procedure not performed.

## 2024-05-02 NOTE — TOC CM/SW Note (Signed)
 Transition of Care [facility]) - Inpatient Brief Assessment   Patient Details  Name: Shawne Bulow MRN: [id number] Date of Birth: 08/01/1940  Transition of Care [facility]) CM/SW Contact:    Lauraine FORBES Saa, LCSWA Phone Number: 05/02/2024, 9:19 AM   Clinical Narrative:  9:20 AM Per chart review, patient resides at home with spouse. Patient has a PCP and insurance. Patient does not have SNF history. Patient has HH history with [facility]. Patient has DME (RW) history with [facility]. Patient's preferred pharmacy's are [facility] [phone] [facility] and [facility] [phone] [place]. No TOC needs identified at this time. TOC will continue to follow.  Transition of Care Asessment: Insurance and Status: Insurance coverage has been reviewed Patient has primary care physician: Yes Home environment has been reviewed: Private Residence Prior level of function:: N/A Prior/Current Home Services: No current home services Social Drivers of Health Review: SDOH reviewed no interventions necessary Readmission risk has been reviewed: Yes (Currently Green 14%) Transition of care needs: no transition of care needs at this time

## 2024-05-02 NOTE — Care Management Important Message (Signed)
 Important Message  Patient Details  Name: Carla Petersen MRN: [id number] Date of Birth: 07/15/1940   Important Message Given:  Yes - Medicare IM     Claretta Deed 05/02/2024, 3:33 PM

## 2024-05-02 NOTE — Progress Notes (Addendum)
 PROGRESS NOTE    Carla Petersen  FMW:[id number] DOB: 11/01/1940 DOA: 04/29/2024 PCP: Ransom Other, MD   Brief Narrative:  Carla Petersen is a 83 y.o. female with medical history significant of CHF, Graves who presented to the emergency department due to heart palpitations -noted to be in A-fib/flutter with worsening shortness of breath orthopnea and dyspnea with exertion consistent with heart failure exacerbation.  Cardiology called in consult, hospitalist called to admit.  Assessment & Plan:   Principal Problem:   Acute exacerbation of CHF (congestive heart failure) (HCC) Active Problems:   Acute on chronic diastolic heart failure (HCC)   Acute respiratory failure with hypoxia (HCC)   Atrial fibrillation with RVR (HCC)   Prolonged QT interval  Acute heart failure exacerbation, preserved ejection fraction -Cardiology following, appreciate insight recommendations -Current goal-directed medical therapy limited by patient's hypotension -Lasix  dose decreased per cardiology - Plan for right thoracentesis per discussion with IR and cardiology - IR consulted for thoracentesis, Left, today - Dr Laurence able to perform bedside Right thoracentesis 12/14 she tolerated well without complication -respiratory status moderately improved - Lights criteria not met (although pleural/serum LDH ratio 0.58(cutoff 0.6)) - consistent with heart failure exacerbation - Tachycardia ongoing, continue amiodarone  - Eliquis  to restart today - Repeat echocardiogram 12/13 shows EF greater than 75% with hyperdynamic function indeterminate diastolic parameters  Acute hypoxic respiratory failure secondary to above, ongoing -Chest x-ray notable for bilateral pleural effusions, diuretics ongoing as above -Oxygen supplementation initially 6 L nasal cannula - peaked at 10L HFNC - wean as tolerated   Paroxysmal A-fib -currently rate controlled, continue Eliquis , amiodarone  transition to p.o. per  cardiology Hyperlipidemia-continue Lipitor Hypertension -metoprolol  discontinued, continue Lasix  as above   Hypothyroidism - Continue levothyroxine  Hypokalemia-repleted, currently within normal limits  DVT prophylaxis: SCDs Start: 04/29/24 2013 Code Status:   Code Status: Full Code Family Communication: Daughter at bedside  Status is: Inpatient  Dispo: The patient is from: Home              Anticipated d/c is to: Home              Anticipated d/c date is: 48 to 72 hours              Patient currently not medically stable for discharge  Consultants:  Cardiology  Procedures:  None  Antimicrobials:  None  Subjective: No acute issues or events overnight, respiratory status stable, tolerated thoracentesis 12/14 -in good spirits, denies nausea vomiting diarrhea constipation headache fever chills or chest pain.  Objective: Vitals:   05/01/24 2100 05/01/24 2330 05/02/24 0327 05/02/24 0400  BP: 101/70 115/68  113/74  Pulse: (!) 114 (!) 120  (!) 115  Resp:  16 16 20   Temp:  98.1 F (36.7 C)  97.8 F (36.6 C)  TempSrc:  Oral  Oral  SpO2: 91% 93%  93%  Weight:    72.7 kg  Height:        Intake/Output Summary (Last 24 hours) at 05/02/2024 0719 Last data filed at 05/02/2024 0411 Gross per 24 hour  Intake 663.28 ml  Output 1150 ml  Net -486.72 ml   Filed Weights   04/30/24 1246 05/01/24 0342 05/02/24 0400  Weight: 74 kg 75.7 kg 72.7 kg    Examination:  General:  Pleasantly resting in bed, No acute distress. HEENT:  Normocephalic atraumatic.  Sclerae nonicteric, noninjected.  Extraocular movements intact bilaterally. Neck:  Without mass or deformity.  Trachea is midline. Lungs: Bibasilar rales. Heart:  Regular rate and  rhythm.  Without murmurs, rubs, or gallops. Abdomen:  Soft, nontender, nondistended.  Without guarding or rebound. Extremities: Without cyanosis, clubbing, 1-2+ pitting edema bilateral lower extremities. Skin:  Warm and dry: noted pressure  injury Wound 04/30/24 1250 Pressure Injury Buttocks Medial;Bilateral Stage 1 -  Intact skin with non-blanchable redness of a localized area usually over a bony prominence. (Active)   Data Reviewed: I have personally reviewed following labs and imaging studies  CBC: Recent Labs  Lab 04/29/24 1546 04/29/24 1713 04/30/24 0342  WBC 12.5*  --  9.6  HGB 13.6 14.6  14.3 12.6  HCT 42.4 43.0  42.0 39.9  MCV 96.4  --  96.1  PLT 234  --  230   Basic Metabolic Panel: Recent Labs  Lab 04/29/24 1546 04/29/24 1713 04/29/24 1957 04/30/24 0342 05/01/24 0325 05/02/24 0343  NA 139 142  140 140 141 139 138  K 3.3* 2.7*  2.7* 3.7 4.1 3.1* 3.7  CL 93* 94* 95* 99 91* 95*  CO2 35*  --  34* 34* 36* 38*  GLUCOSE 119* 120* 112* 112* 112* 97  BUN 23 26* 22 18 17 16   CREATININE 1.24* 1.30* 1.13* 1.12* 1.14* 1.20*  CALCIUM  8.9  --  8.6* 8.5* 8.1* 8.1*  MG  --   --  2.6* 2.1 2.0 1.8   GFR: Estimated Creatinine Clearance: 32.7 mL/min (A) (by C-G formula based on SCr of 1.2 mg/dL (H)).  Coagulation Profile: Recent Labs  Lab 04/30/24 0342  INR 2.3*   Sepsis Labs: Recent Labs  Lab 04/29/24 1725  LATICACIDVEN 1.7   Recent Results (from the past 240 hours)  MRSA Next Gen by PCR, Nasal     Status: None   Collection Time: 04/30/24 12:50 PM   Specimen: Nasal Mucosa; Nasal Swab  Result Value Ref Range Status   MRSA by PCR Next Gen NOT DETECTED NOT DETECTED Final    Comment: (NOTE) The GeneXpert MRSA Assay (FDA approved for NASAL specimens only), is one component of a comprehensive MRSA colonization surveillance program. It is not intended to diagnose MRSA infection nor to guide or monitor treatment for MRSA infections. Test performance is not FDA approved in patients less than 53 years old. Performed at [facility], [address], [place], [place] [address]   Pleural fluid culture w Gram Stain     Status: None (Preliminary result)   Collection Time: 05/01/24  2:04 PM    Specimen: Pleural Fluid  Result Value Ref Range Status   Specimen Description PLEURAL  Final   Special Requests NONE  Final   Gram Stain   Final    NO WBC SEEN NO ORGANISMS SEEN Performed at [facility], [address], [place], [place] [address]    Culture PENDING  Incomplete   Report Status PENDING  Incomplete     Radiology Studies: DG CHEST PORT 1 VIEW Result Date: 05/01/2024 CLINICAL DATA:  [id number] S/P thoracentesis [id number] EXAM: PORTABLE CHEST 1 VIEW COMPARISON:  May 01, 2024 FINDINGS: The cardiomediastinal silhouette is unchanged and enlarged in contour. Moderate LEFT pleural effusion. Decreased haziness overlying the RIGHT hemithorax consistent with decreased RIGHT-sided pleural effusion. No significant pneumothorax. Persistent LEFT retrocardiac homogeneous opacity. Background of diffuse interstitial prominence, likely pulmonary edema. Remote rib fractures. IMPRESSION: 1. Decreased RIGHT-sided pleural effusion status post thoracentesis. No significant pneumothorax. 2. Moderate LEFT pleural effusion with persistent LEFT retrocardiac opacity, likely atelectasis. Electronically Signed   By: Corean Salter M.D.   On: 05/01/2024 18:50  DG CHEST PORT 1 VIEW Result Date: 05/01/2024 EXAM: 1 VIEW(S) XRAY OF THE CHEST 05/01/2024 07:13:00 AM COMPARISON: Portable chest and chest CT both 04/29/2024. CLINICAL HISTORY: Pleural effusion. FINDINGS: LUNGS AND PLEURA: Moderate pleural effusions with overlying atelectasis and patchy airspace disease of the upper to mid lung regions. No pneumothorax. HEART AND MEDIASTINUM: Cardiomegaly. There is worsening central vascular prominence and central interstitial edema. Stable mediastinum with aortic atherosclerosis. BONES AND SOFT TISSUES: No acute osseous abnormality. IMPRESSION: 1. Moderate pleural effusions with overlying atelectasis and patchy airspace disease of the upper to mid lung regions. 2. Cardiomegaly with worsening central vascular  prominence and central interstitial edema. Electronically signed by: Francis Quam MD 05/01/2024 07:43 AM EST RP Workstation: [id number]   ECHOCARDIOGRAM LIMITED Result Date: 04/30/2024    ECHOCARDIOGRAM LIMITED REPORT   Patient Name:   JULIEN BERRYMAN Date of Exam: 04/30/2024 Medical Rec #:  [id number]      Height:       63.0 in Accession #:    [id number]     Weight:       163.1 lb Date of Birth:  09-12-1940       BSA:          1.773 m Patient Age:    83 years       BP:           117/64 mmHg Patient Gender: F              HR:           110 bpm. Exam Location:  Inpatient Procedure: Limited Color Doppler, Cardiac Doppler and Limited Echo (Both            Spectral and Color Flow Doppler were utilized during procedure). Indications:    acute systolic chf  History:        Patient has prior history of Echocardiogram examinations, most                 recent 10/02/2022. CHF, Arrythmias:Atrial Fibrillation; Risk                 Factors:Sleep Apnea.  Sonographer:    Tinnie Barefoot RDCS Referring Phys: [id number] ROBERT DORRELL IMPRESSIONS  1. Left ventricular ejection fraction, by estimation, is >75%. The left ventricle has hyperdynamic function. The left ventricle has no regional wall motion abnormalities. Left ventricular diastolic parameters are indeterminate.  2. Right ventricular systolic function is normal. The right ventricular size is normal. There is mildly elevated pulmonary artery systolic pressure.  3. Left atrial size was severely dilated.  4. Right atrial size was mildly dilated.  5. Moderate pleural effusion in the left lateral region.  6. The mitral valve is normal in structure. Mild to moderate mitral valve regurgitation. No evidence of mitral stenosis.  7. The aortic valve is calcified. Aortic valve regurgitation is not visualized. Mild aortic valve stenosis.  8. The inferior vena cava is dilated in size with >50% respiratory variability, suggesting right atrial pressure of 8 mmHg. FINDINGS  Left  Ventricle: Left ventricular ejection fraction, by estimation, is >75%. The left ventricle has hyperdynamic function. The left ventricle has no regional wall motion abnormalities. The left ventricular internal cavity size was normal in size. There is no left ventricular hypertrophy. Left ventricular diastolic parameters are indeterminate. Left ventricular diastolic function could not be evaluated due to atrial fibrillation. Right Ventricle: The right ventricular size is normal. Right ventricular systolic function is normal. There is mildly elevated pulmonary artery systolic pressure. The tricuspid  regurgitant velocity is 2.89 m/s, and with an assumed right atrial pressure of 8 mmHg, the estimated right ventricular systolic pressure is 41.4 mmHg. Left Atrium: Left atrial size was severely dilated. Right Atrium: Right atrial size was mildly dilated. Pericardium: There is no evidence of pericardial effusion. Mitral Valve: The mitral valve is normal in structure. Mild mitral annular calcification. Mild to moderate mitral valve regurgitation. No evidence of mitral valve stenosis. Tricuspid Valve: The tricuspid valve is normal in structure. Tricuspid valve regurgitation is mild . No evidence of tricuspid stenosis. Aortic Valve: The aortic valve is calcified. Aortic valve regurgitation is not visualized. Mild aortic stenosis is present. Aortic valve mean gradient measures 12.2 mmHg. Aortic valve peak gradient measures 22.0 mmHg. Pulmonic Valve: The pulmonic valve was normal in structure. Pulmonic valve regurgitation is not visualized. No evidence of pulmonic stenosis. Aorta: The aortic root is normal in size and structure. Venous: The inferior vena cava is dilated in size with greater than 50% respiratory variability, suggesting right atrial pressure of 8 mmHg. IAS/Shunts: No atrial level shunt detected by color flow Doppler. Additional Comments: There is a moderate pleural effusion in the left lateral region. Spectral  Doppler performed. Color Doppler performed.  LEFT VENTRICLE PLAX 2D LVIDd:         4.30 cm LVIDs:         2.60 cm  IVC IVC diam: 2.20 cm AORTIC VALVE AV Vmax:           234.50 cm/s AV Vmean:          160.250 cm/s AV VTI:            0.361 m AV Peak Grad:      22.0 mmHg AV Mean Grad:      12.2 mmHg LVOT Vmax:         98.25 cm/s LVOT Vmean:        63.900 cm/s LVOT VTI:          0.176 m LVOT/AV VTI ratio: 0.49  AORTA Ao Asc diam: 3.00 cm TRICUSPID VALVE TR Peak grad:   33.4 mmHg TR Vmax:        289.00 cm/s  SHUNTS Systemic VTI: 0.18 m Redell Shallow MD Electronically signed by Redell Shallow MD Signature Date/Time: 04/30/2024/1:57:13 PM    Final    Scheduled Meds:  atorvastatin   20 mg Oral Daily   furosemide   20 mg Intravenous BID   levothyroxine   25 mcg Oral Q0600   Continuous Infusions:  amiodarone  30 mg/hr (05/02/24 0130)    LOS: 3 days   Time spent:  Elsie JAYSON Montclair, DO [facility]  If 7PM-7AM, please contact night-coverage [email or web address]  05/02/2024, 7:19 AM

## 2024-05-03 ENCOUNTER — Inpatient Hospital Stay (HOSPITAL_COMMUNITY)

## 2024-05-03 DIAGNOSIS — I5033 Acute on chronic diastolic (congestive) heart failure: Secondary | ICD-10-CM | POA: Diagnosis not present

## 2024-05-03 DIAGNOSIS — I4891 Unspecified atrial fibrillation: Secondary | ICD-10-CM | POA: Diagnosis not present

## 2024-05-03 DIAGNOSIS — J9601 Acute respiratory failure with hypoxia: Secondary | ICD-10-CM | POA: Diagnosis not present

## 2024-05-03 DIAGNOSIS — R9431 Abnormal electrocardiogram [ECG] [EKG]: Secondary | ICD-10-CM | POA: Diagnosis not present

## 2024-05-03 LAB — BASIC METABOLIC PANEL WITH GFR
Anion gap: 7 (ref 5–15)
BUN: 16 mg/dL (ref 8–23)
CO2: 38 mmol/L — ABNORMAL HIGH (ref 22–32)
Calcium: 8 mg/dL — ABNORMAL LOW (ref 8.9–10.3)
Chloride: 91 mmol/L — ABNORMAL LOW (ref 98–111)
Creatinine, Ser: 1.15 mg/dL — ABNORMAL HIGH (ref 0.44–1.00)
GFR, Estimated: 47 mL/min — ABNORMAL LOW (ref >60)
Glucose, Bld: 112 mg/dL — ABNORMAL HIGH (ref 70–99)
Potassium: 3.2 mmol/L — ABNORMAL LOW (ref 3.5–5.1)
Sodium: 136 mmol/L (ref 135–145)

## 2024-05-03 LAB — CBC WITH DIFFERENTIAL/PLATELET
Abs Immature Granulocytes: 0.03 K/uL (ref 0.00–0.07)
Basophils Absolute: 0 K/uL (ref 0.0–0.1)
Basophils Relative: 0 %
Eosinophils Absolute: 0.1 K/uL (ref 0.0–0.5)
Eosinophils Relative: 1 %
HCT: 41 % (ref 36.0–46.0)
Hemoglobin: 13.3 g/dL (ref 12.0–15.0)
Immature Granulocytes: 0 %
Lymphocytes Relative: 8 %
Lymphs Abs: 0.7 K/uL (ref 0.7–4.0)
MCH: 30.3 pg (ref 26.0–34.0)
MCHC: 32.4 g/dL (ref 30.0–36.0)
MCV: 93.4 fL (ref 80.0–100.0)
Monocytes Absolute: 0.8 K/uL (ref 0.1–1.0)
Monocytes Relative: 8 %
Neutro Abs: 7.5 K/uL (ref 1.7–7.7)
Neutrophils Relative %: 83 %
Platelets: 219 K/uL (ref 150–400)
RBC: 4.39 MIL/uL (ref 3.87–5.11)
RDW: 14.4 % (ref 11.5–15.5)
WBC: 9.1 K/uL (ref 4.0–10.5)
nRBC: 0 % (ref 0.0–0.2)

## 2024-05-03 LAB — MAGNESIUM: Magnesium: 2 mg/dL (ref 1.7–2.4)

## 2024-05-03 LAB — CYTOLOGY - NON PAP

## 2024-05-03 MED ORDER — FUROSEMIDE 10 MG/ML IJ SOLN
40.0000 mg | Freq: Two times a day (BID) | INTRAMUSCULAR | Status: DC
  Administered 2024-05-03 – 2024-05-04 (×3): 40 mg via INTRAVENOUS
  Filled 2024-05-03 (×3): qty 4

## 2024-05-03 MED ORDER — POTASSIUM CHLORIDE CRYS ER 20 MEQ PO TBCR
40.0000 meq | EXTENDED_RELEASE_TABLET | ORAL | Status: AC
  Administered 2024-05-03 (×2): 40 meq via ORAL
  Filled 2024-05-03 (×2): qty 2

## 2024-05-03 MED ORDER — POTASSIUM CHLORIDE CRYS ER 20 MEQ PO TBCR: 40.0000 meq | EXTENDED_RELEASE_TABLET | Freq: Once | ORAL | Status: DC

## 2024-05-03 NOTE — Plan of Care (Signed)

## 2024-05-03 NOTE — Progress Notes (Addendum)
 PROGRESS NOTE    Carla Petersen  FMW:[id number] DOB: 01/13/1941 DOA: 04/29/2024 PCP: Ransom Other, MD   Brief Narrative:  Carla Petersen is a 83 y.o. female with medical history significant of CHF, Graves who presented to the emergency department due to heart palpitations -noted to be in A-fib/flutter with worsening shortness of breath orthopnea and dyspnea with exertion consistent with heart failure exacerbation.  Cardiology called in consult, hospitalist called to admit.  Assessment & Plan:   Principal Problem:   Acute exacerbation of CHF (congestive heart failure) (HCC) Active Problems:   Acute on chronic diastolic heart failure (HCC)   Acute respiratory failure with hypoxia (HCC)   Atrial fibrillation with rapid ventricular response (HCC)   Prolonged QT interval  Acute heart failure exacerbation, preserved ejection fraction - Cardiology following, appreciate insight recommendations - Current goal-directed medical therapy limited by patient's hypotension   - Lasix  dose decreased per cardiology - Status post right thoracentesis 12/14, left thoracentesis on hold given improvement in effusion - Oxygen weaned to 5 L nasal cannula, goal saturation is 88 to 91% at this time - Repeat CT chest without contrast to evaluate for ongoing effusions - Lights criteria not met (although pleural/serum LDH ratio 0.58(cutoff 0.6)) - consistent with heart failure exacerbation - Tachycardia ongoing but generally rate controlled, continue amiodarone  - Continue eliquis  - Repeat echocardiogram 12/13 shows EF greater than 75% with hyperdynamic function indeterminate diastolic parameters  Acute hypoxic respiratory failure secondary to above, ongoing -Chest x-ray notable for bilateral pleural effusions, diuretics ongoing as above -Oxygen supplementation initially 6 L nasal cannula - peaked at 10L HFNC - wean as tolerated   Paroxysmal A-fib -currently rate controlled, continue Eliquis ,  amiodarone  Hyperlipidemia-continue Lipitor Hypertension -metoprolol  discontinued, continue Lasix  as above Hypothyroidism - Continue levothyroxine  Hypokalemia-ongoing in the setting of diuresis, 80 mEq today  DVT prophylaxis: SCDs Start: 04/29/24 2013 apixaban  (ELIQUIS ) tablet 5 mg Code Status:   Code Status: Full Code Family Communication: Daughter at bedside  Status is: Inpatient  Dispo: The patient is from: Home              Anticipated d/c is to: Home              Anticipated d/c date is: 48 to 72 hours              Patient currently not medically stable for discharge  Consultants:  Cardiology  Procedures:  None  Antimicrobials:  None  Subjective: No acute issues or events overnight, respiratory status improving somewhat, patient more ambulatory over the past 48 hours, continue to encourage increased activity as tolerated.  Denies nausea vomit diarrhea constipation headache fevers chills chest pain.  Objective: Vitals:   05/02/24 1925 05/02/24 2315 05/03/24 0309 05/03/24 0500  BP: 102/64 (!) 104/57 (!) 108/57   Pulse: (!) 123 (!) 120 (!) 112   Resp: 19 20 19    Temp: 98.5 F (36.9 C) 98.2 F (36.8 C) 97.8 F (36.6 C)   TempSrc: Oral Oral Oral   SpO2: 94% 93% 94%   Weight:    77.6 kg  Height:        Intake/Output Summary (Last 24 hours) at 05/03/2024 0717 Last data filed at 05/03/2024 0500 Gross per 24 hour  Intake 698.85 ml  Output 1600 ml  Net -901.15 ml   Filed Weights   05/01/24 0342 05/02/24 0400 05/03/24 0500  Weight: 75.7 kg 72.7 kg 77.6 kg    Examination:  General:  Pleasantly resting in bed, No acute distress.  HEENT:  Normocephalic atraumatic.  Sclerae nonicteric, noninjected.  Extraocular movements intact bilaterally. Neck:  Without mass or deformity.  Trachea is midline. Lungs: Bibasilar rales. Heart:  Regular rate and rhythm.  Without murmurs, rubs, or gallops. Abdomen:  Soft, nontender, nondistended.  Without guarding or  rebound. Extremities: Without cyanosis, clubbing, scant edema bilateral lower extremities. Skin:  Warm and dry: noted pressure injury Wound 04/30/24 1250 Pressure Injury Buttocks Medial;Bilateral Stage 1 -  Intact skin with non-blanchable redness of a localized area usually over a bony prominence. (Active)   Data Reviewed: I have personally reviewed following labs and imaging studies  CBC: Recent Labs  Lab 04/29/24 1546 04/29/24 1713 04/30/24 0342  WBC 12.5*  --  9.6  HGB 13.6 14.6  14.3 12.6  HCT 42.4 43.0  42.0 39.9  MCV 96.4  --  96.1  PLT 234  --  230   Basic Metabolic Panel: Recent Labs  Lab 04/29/24 1957 04/30/24 0342 05/01/24 0325 05/02/24 0343 05/03/24 0509  NA 140 141 139 138 136  K 3.7 4.1 3.1* 3.7 3.2*  CL 95* 99 91* 95* 91*  CO2 34* 34* 36* 38* 38*  GLUCOSE 112* 112* 112* 97 112*  BUN 22 18 17 16 16   CREATININE 1.13* 1.12* 1.14* 1.20* 1.15*  CALCIUM  8.6* 8.5* 8.1* 8.1* 8.0*  MG 2.6* 2.1 2.0 1.8 2.0   GFR: Estimated Creatinine Clearance: 35.3 mL/min (A) (by C-G formula based on SCr of 1.15 mg/dL (H)).  Coagulation Profile: Recent Labs  Lab 04/30/24 0342  INR 2.3*   Sepsis Labs: Recent Labs  Lab 04/29/24 1725  LATICACIDVEN 1.7   Recent Results (from the past 240 hours)  MRSA Next Gen by PCR, Nasal     Status: None   Collection Time: 04/30/24 12:50 PM   Specimen: Nasal Mucosa; Nasal Swab  Result Value Ref Range Status   MRSA by PCR Next Gen NOT DETECTED NOT DETECTED Final    Comment: (NOTE) The GeneXpert MRSA Assay (FDA approved for NASAL specimens only), is one component of a comprehensive MRSA colonization surveillance program. It is not intended to diagnose MRSA infection nor to guide or monitor treatment for MRSA infections. Test performance is not FDA approved in patients less than 20 years old. Performed at [facility], [address] [place], [place] [address]   Pleural fluid culture w Gram Stain     Status: None  (Preliminary result)   Collection Time: 05/01/24  2:04 PM   Specimen: Pleural Fluid  Result Value Ref Range Status   Specimen Description PLEURAL  Final   Special Requests NONE  Final   Gram Stain NO WBC SEEN NO ORGANISMS SEEN   Final   Culture   Final    NO GROWTH < 24 HOURS Performed at [facility], [address] [place], [place] [address]    Report Status PENDING  Incomplete     Radiology Studies: IR US  CHEST Result Date: 05/02/2024 INDICATION: 83 year old female. History of CHF. Found to have bilateral pleural effusions. Request is for therapeutic right-sided the thoracentesis EXAM: CHEST ULTRASOUND COMPARISON:  Chest x-ray dated May 01, 2024 FINDINGS: Small left-sided pleural effusion. IMPRESSION: Small left-sided pleural effusion. Patient declined the procedure at this time. Read by: Delon Beagle, NP Electronically Signed   By: Cordella Banner   On: 05/02/2024 16:21   DG CHEST PORT 1 VIEW Result Date: 05/01/2024 CLINICAL DATA:  [id number] S/P thoracentesis [id number] EXAM: PORTABLE CHEST 1 VIEW COMPARISON:  May 01, 2024 FINDINGS: The cardiomediastinal silhouette is unchanged and enlarged in contour. Moderate LEFT pleural effusion. Decreased haziness overlying the RIGHT hemithorax consistent with decreased RIGHT-sided pleural effusion. No significant pneumothorax. Persistent LEFT retrocardiac homogeneous opacity. Background of diffuse interstitial prominence, likely pulmonary edema. Remote rib fractures. IMPRESSION: 1. Decreased RIGHT-sided pleural effusion status post thoracentesis. No significant pneumothorax. 2. Moderate LEFT pleural effusion with persistent LEFT retrocardiac opacity, likely atelectasis. Electronically Signed   By: Corean Salter M.D.   On: 05/01/2024 18:50   Scheduled Meds:  apixaban   5 mg Oral BID   atorvastatin   20 mg Oral Daily   furosemide   20 mg Intravenous BID   levothyroxine   25 mcg Oral Q0600   potassium chloride   40 mEq Oral Once    Continuous Infusions:  amiodarone  60 mg/hr (05/03/24 0355)    LOS: 4 days   Time spent:  Elsie JAYSON Montclair, DO [facility]  If 7PM-7AM, please contact night-coverage [email or web address]  05/03/2024, 7:17 AM

## 2024-05-03 NOTE — Progress Notes (Signed)
 Rounding Note   Patient Name: Carla Petersen Date of Encounter: 05/03/2024  [facility] Cardiologist: Lonni LITTIE Nanas, MD   Subjective - No acute events overnight.   - Still has no complaints today but remains on a lot of oxygen - Net -900 cc over 24 hours  Scheduled Meds:  apixaban   5 mg Oral BID   atorvastatin   20 mg Oral Daily   furosemide   40 mg Intravenous BID   levothyroxine   25 mcg Oral Q0600   potassium chloride   40 mEq Oral Q4H   Continuous Infusions:  amiodarone  60 mg/hr (05/03/24 0935)   PRN Meds: acetaminophen  **OR** acetaminophen , ondansetron  **OR** ondansetron  (ZOFRAN ) IV, mouth rinse   Vital Signs  Vitals:   05/02/24 1925 05/02/24 2315 05/03/24 0309 05/03/24 0500  BP: 102/64 (!) 104/57 (!) 108/57   Pulse: (!) 123 (!) 120 (!) 112   Resp: 19 20 19    Temp: 98.5 F (36.9 C) 98.2 F (36.8 C) 97.8 F (36.6 C)   TempSrc: Oral Oral Oral   SpO2: 94% 93% 94%   Weight:    77.6 kg  Height:        Intake/Output Summary (Last 24 hours) at 05/03/2024 1124 Last data filed at 05/03/2024 0851 Gross per 24 hour  Intake 938.85 ml  Output 1600 ml  Net -661.15 ml      05/03/2024    5:00 AM 05/02/2024    4:00 AM 05/01/2024    3:42 AM  Last 3 Weights  Weight (lbs) 171 lb 1.2 oz 160 lb 4.4 oz 166 lb 14.2 oz  Weight (kg) 77.6 kg 72.7 kg 75.7 kg      Telemetry Atrial fibrillation with RVR- Personally Reviewed  ECG  Atrial fibrillation with RVR- Personally Reviewed  Physical Exam  GEN: No acute distress.   Neck: no JVD Cardiac: Tachycardic with irregularly irregular rhythm, II/VI systolic murmur, no rubs, or gallops.  Respiratory: Bibasilar rales, no wheezes or rhonchi GI: Soft, nontender, non-distended  MS: 1+ pitting edema bilaterally; No deformity. Neuro:  Nonfocal  Psych: Normal affect   Labs High Sensitivity Troponin:   Recent Labs  Lab 04/29/24 1624 04/29/24 1957  TROPONINIHS 7 8   No results for input(s): TRNPT in the  last 720 hours.     Chemistry Recent Labs  Lab 04/30/24 0815 05/01/24 0325 05/01/24 1745 05/02/24 0343 05/03/24 0509  NA  --  139  --  138 136  K  --  3.1*  --  3.7 3.2*  CL  --  91*  --  95* 91*  CO2  --  36*  --  38* 38*  GLUCOSE  --  112*  --  97 112*  BUN  --  17  --  16 16  CREATININE  --  1.14*  --  1.20* 1.15*  CALCIUM   --  8.1*  --  8.1* 8.0*  MG  --  2.0  --  1.8 2.0  PROT 6.4*  --  6.7  --   --   ALBUMIN 2.5*  --  2.5*  --   --   AST 21  --   --   --   --   ALT 17  --   --   --   --   ALKPHOS 128*  --   --   --   --   BILITOT 1.1  --   --   --   --   GFRNONAA  --  47*  --  44* 47*  ANIONGAP  --  12  --  5 7    Lipids No results for input(s): CHOL, TRIG, HDL, LABVLDL, LDLCALC, CHOLHDL in the last 168 hours.  Hematology Recent Labs  Lab 04/29/24 1546 04/29/24 1713 04/30/24 0342 05/03/24 1027  WBC 12.5*  --  9.6 9.1  RBC 4.40  --  4.15 4.39  HGB 13.6 14.6  14.3 12.6 13.3  HCT 42.4 43.0  42.0 39.9 41.0  MCV 96.4  --  96.1 93.4  MCH 30.9  --  30.4 30.3  MCHC 32.1  --  31.6 32.4  RDW 14.3  --  14.6 14.4  PLT 234  --  230 219   Thyroid  No results for input(s): TSH, FREET4 in the last 168 hours.  BNP Recent Labs  Lab 04/29/24 1546 05/01/24 0325  BNP 584.1* 396.0*    DDimer No results for input(s): DDIMER in the last 168 hours.   Radiology  DG CHEST PORT 1 VIEW Result Date: 05/03/2024 EXAM: 1 VIEW(S) XRAY OF THE CHEST 05/03/2024 08:07:00 AM COMPARISON: 05/01/2024 CLINICAL HISTORY: Hypoxic respiratory failure (HCC) FINDINGS: LUNGS AND PLEURA: Mild pulmonary edema and interstitial opacities improved. Small bilateral pleural effusions, left greater than right. Bibasilar opacities, likely atelectasis. No pneumothorax. HEART AND MEDIASTINUM: Cardiomegaly. Aortic atherosclerosis. BONES AND SOFT TISSUES: Degenerative changes of left shoulder. No acute osseous abnormality. IMPRESSION: 1. Mild pulmonary edema and interstitial opacities, improved.  2. Small bilateral pleural effusions, left greater than right. 3. Cardiomegaly and aortic atherosclerosis. Electronically signed by: Evalene Coho MD 05/03/2024 08:16 AM EST RP Workstation: [id number]   IR US  CHEST Result Date: 05/02/2024 INDICATION: 83 year old female. History of CHF. Found to have bilateral pleural effusions. Request is for therapeutic right-sided the thoracentesis EXAM: CHEST ULTRASOUND COMPARISON:  Chest x-ray dated May 01, 2024 FINDINGS: Small left-sided pleural effusion. IMPRESSION: Small left-sided pleural effusion. Patient declined the procedure at this time. Read by: Delon Beagle, NP Electronically Signed   By: Cordella Banner   On: 05/02/2024 16:21   DG CHEST PORT 1 VIEW Result Date: 05/01/2024 CLINICAL DATA:  [id number] S/P thoracentesis [id number] EXAM: PORTABLE CHEST 1 VIEW COMPARISON:  May 01, 2024 FINDINGS: The cardiomediastinal silhouette is unchanged and enlarged in contour. Moderate LEFT pleural effusion. Decreased haziness overlying the RIGHT hemithorax consistent with decreased RIGHT-sided pleural effusion. No significant pneumothorax. Persistent LEFT retrocardiac homogeneous opacity. Background of diffuse interstitial prominence, likely pulmonary edema. Remote rib fractures. IMPRESSION: 1. Decreased RIGHT-sided pleural effusion status post thoracentesis. No significant pneumothorax. 2. Moderate LEFT pleural effusion with persistent LEFT retrocardiac opacity, likely atelectasis. Electronically Signed   By: Corean Salter M.D.   On: 05/01/2024 18:50    Cardiac Studies   TTE 04/30/24:  IMPRESSIONS     1. Left ventricular ejection fraction, by estimation, is >75%. The left  ventricle has hyperdynamic function. The left ventricle has no regional  wall motion abnormalities. Left ventricular diastolic parameters are  indeterminate.   2. Right ventricular systolic function is normal. The right ventricular  size is normal. There is mildly elevated  pulmonary artery systolic  pressure.   3. Left atrial size was severely dilated.   4. Right atrial size was mildly dilated.   5. Moderate pleural effusion in the left lateral region.   6. The mitral valve is normal in structure. Mild to moderate mitral valve  regurgitation. No evidence of mitral stenosis.   7. The aortic valve is calcified. Aortic valve regurgitation is not  visualized. Mild aortic valve stenosis.  8. The inferior vena cava is dilated in size with >50% respiratory  variability, suggesting right atrial pressure of 8 mmHg.   Patient Profile   Ms. Overacker is a 83 y.o. female with a PMH of persistent AF, HFpEF, HLD, Graves' disease who was admitted for acute hypoxic respiratory failure in the setting of acute on chronic HFpEF exacerbation and atrial fibrillation with RVR.  Cardiology was consulted for the evaluation of her cardiovascular issues.  Assessment & Plan    #Acute on Chronic HFpEF Exacerbation #Acute Hypoxic Respiratory Failure - Patient admitted with volume overload in the setting of decompensated heart failure. - Diuresis has been challenging due to low blood pressures. - Ultimately the patient remains volume overloaded and does need to get additional fluid removed. - Will plan to increase her Lasix  dosing today to see if that improves her overall volume status. Increase IV Lasix  to 40 mg twice daily Consider repeat chest CT given ongoing oxygen requirement Strict I's and O's, daily weights S/p thoracentesis R 12/14, s/p L thoracentesis 12/15  #Atrial Fibrillation with RVR - Patient found to be in atrial fibrillation with RVR on admission. - She underwent unsuccessful DCCV on 12/1 and was discharged at an outside hospital on p.o. Amio. - Currently she is tolerating IV amiodarone  well; however, she remains in A-fib with RVR. - Ultimately she may need repeat DCCV after completion of her IV amiodarone  load, but given her high oxygen requirement I am concerned  about the safety of a TEE at this time. Continue IV amiodarone  to 60 mg/hr Continue Eliquis  5 mg twice daily May ultimately need repeat DCCV, but will need a TEE first given interruptions in her anticoagulation status.  Given that she is still hypoxic I am not thrilled about the idea of her undergoing that procedure at this time. Maintain K>4, Mg>2 Maintain telemetry Daily ECG for QTc monitoring  #HLD Continue atorvastatin  20 mg daily    For questions or updates, please contact [facility] Please consult [email or web address] for contact info under       Signed, Georganna Archer, MD  05/03/2024, 11:24 AM

## 2024-05-04 ENCOUNTER — Inpatient Hospital Stay (HOSPITAL_COMMUNITY)

## 2024-05-04 DIAGNOSIS — I5031 Acute diastolic (congestive) heart failure: Secondary | ICD-10-CM | POA: Diagnosis not present

## 2024-05-04 DIAGNOSIS — I4891 Unspecified atrial fibrillation: Secondary | ICD-10-CM | POA: Diagnosis not present

## 2024-05-04 LAB — BODY FLUID CULTURE W GRAM STAIN
Culture: NO GROWTH
Gram Stain: NONE SEEN

## 2024-05-04 MED ORDER — AMIODARONE HCL 200 MG PO TABS
200.0000 mg | ORAL_TABLET | Freq: Two times a day (BID) | ORAL | Status: DC
  Administered 2024-05-04 – 2024-05-05 (×4): 200 mg via ORAL
  Filled 2024-05-04 (×4): qty 1

## 2024-05-04 MED ORDER — SODIUM CHLORIDE 0.45 % IV SOLN: INTRAVENOUS | Status: AC

## 2024-05-04 MED ORDER — HYALURONIDASE HUMAN 150 UNIT/ML IJ SOLN
150.0000 [IU] | Freq: Once | INTRAMUSCULAR | Status: DC
  Filled 2024-05-04: qty 1

## 2024-05-04 MED ORDER — AMIODARONE HCL 200 MG PO TABS: 200.0000 mg | ORAL_TABLET | Freq: Every day | ORAL | Status: DC

## 2024-05-04 NOTE — Progress Notes (Signed)
 Unable to get walking 02 as patient is on 5L O2 and desats when standing up.

## 2024-05-04 NOTE — Progress Notes (Signed)
 MD allowing purwick use throughout the night as patient is on IV lasix  with O2 requirements and increased HR, to be removed during the day to encourage ambulation.

## 2024-05-04 NOTE — Plan of Care (Signed)
   Problem: Clinical Measurements: Goal: Will remain free from infection Outcome: Progressing Goal: Diagnostic test results will improve Outcome: Progressing Goal: Respiratory complications will improve Outcome: Progressing Goal: Cardiovascular complication will be avoided Outcome: Progressing

## 2024-05-04 NOTE — Progress Notes (Signed)
 Rounding Note   Patient Name: Carla Petersen Date of Encounter: 05/04/2024  [facility] Cardiologist: Lonni LITTIE Nanas, MD   Subjective - Underwent chest CT overnight which demonstrated bilateral pleural effusions that looked unchanged - Her left upper extremity IV infiltrated with amiodarone  and she was treated with hyaluronidase .  The IV was removed, but she reports ongoing left upper extremity pain - She feels like her breathing is getting better and continues to deny any chest discomfort. - Remains in atrial fibrillation with RVR.  Scheduled Meds:  amiodarone   200 mg Oral BID   Followed by   [name] ON 05/09/2024] amiodarone   200 mg Oral Daily   apixaban   5 mg Oral BID   atorvastatin   20 mg Oral Daily   furosemide   40 mg Intravenous BID   levothyroxine   25 mcg Oral Q0600   Continuous Infusions:   PRN Meds: acetaminophen  **OR** acetaminophen , ondansetron  **OR** ondansetron  (ZOFRAN ) IV, mouth rinse   Vital Signs  Vitals:   05/04/24 0805 05/04/24 1200 05/04/24 1213 05/04/24 1227  BP: (!) 106/59   110/61  Pulse: 84   (!) 107  Resp: 20   20  Temp: 98.1 F (36.7 C) (P) 98 F (36.7 C)  98 F (36.7 C)  TempSrc: Oral   Oral  SpO2: 90%  93% 95%  Weight:      Height:        Intake/Output Summary (Last 24 hours) at 05/04/2024 1341 Last data filed at 05/04/2024 1200 Gross per 24 hour  Intake 120 ml  Output 2250 ml  Net -2130 ml      05/04/2024    3:19 AM 05/03/2024    5:00 AM 05/02/2024    4:00 AM  Last 3 Weights  Weight (lbs) 169 lb 12.1 oz 171 lb 1.2 oz 160 lb 4.4 oz  Weight (kg) 77 kg 77.6 kg 72.7 kg      Telemetry Atrial fibrillation with RVR- Personally Reviewed  ECG  No new ECG  Physical Exam  GEN: No acute distress.   Neck: no JVD Cardiac: Tachycardic with irregularly irregular rhythm, II/VI systolic murmur, no rubs, or gallops.  Respiratory: Bibasilar rales, no wheezes or rhonchi GI: Soft, nontender, non-distended  MS: 1+  pitting edema bilaterally (improving); left upper extremity with erythema and tenderness to palpation to the elbow Neuro:  Nonfocal  Psych: Normal affect   Labs High Sensitivity Troponin:   Recent Labs  Lab 04/29/24 1624 04/29/24 1957  TROPONINIHS 7 8   No results for input(s): TRNPT in the last 720 hours.     Chemistry Recent Labs  Lab 04/30/24 0815 05/01/24 0325 05/01/24 1745 05/02/24 0343 05/03/24 0509  NA  --  139  --  138 136  K  --  3.1*  --  3.7 3.2*  CL  --  91*  --  95* 91*  CO2  --  36*  --  38* 38*  GLUCOSE  --  112*  --  97 112*  BUN  --  17  --  16 16  CREATININE  --  1.14*  --  1.20* 1.15*  CALCIUM   --  8.1*  --  8.1* 8.0*  MG  --  2.0  --  1.8 2.0  PROT 6.4*  --  6.7  --   --   ALBUMIN 2.5*  --  2.5*  --   --   AST 21  --   --   --   --   ALT 17  --   --   --   --  ALKPHOS 128*  --   --   --   --   BILITOT 1.1  --   --   --   --   GFRNONAA  --  47*  --  44* 47*  ANIONGAP  --  12  --  5 7    Lipids No results for input(s): CHOL, TRIG, HDL, LABVLDL, LDLCALC, CHOLHDL in the last 168 hours.  Hematology Recent Labs  Lab 04/29/24 1546 04/29/24 1713 04/30/24 0342 05/03/24 1027  WBC 12.5*  --  9.6 9.1  RBC 4.40  --  4.15 4.39  HGB 13.6 14.6  14.3 12.6 13.3  HCT 42.4 43.0  42.0 39.9 41.0  MCV 96.4  --  96.1 93.4  MCH 30.9  --  30.4 30.3  MCHC 32.1  --  31.6 32.4  RDW 14.3  --  14.6 14.4  PLT 234  --  230 219   Thyroid  No results for input(s): TSH, FREET4 in the last 168 hours.  BNP Recent Labs  Lab 04/29/24 1546 05/01/24 0325  BNP 584.1* 396.0*    DDimer No results for input(s): DDIMER in the last 168 hours.   Radiology  CT CHEST WO CONTRAST Result Date: 05/04/2024 EXAM: CT CHEST WITHOUT CONTRAST 05/04/2024 12:32:18 AM TECHNIQUE: CT of the chest was performed without the administration of intravenous contrast. Multiplanar reformatted images are provided for review. Automated exposure control, iterative reconstruction,  and/or weight based adjustment of the mA/kV was utilized to reduce the radiation dose to as low as reasonably achievable. COMPARISON: Chest x-ray from the previous day, CT from 04/29/2024. CLINICAL HISTORY: Clinical history is pleural effusion and shortness of breath FINDINGS: MEDIASTINUM: Heart: No cardiac enlargement is seen. Coronary calcifications are noted. Pericardium is unremarkable. The central airways are clear. Atherosclerotic calcifications of the aorta are noted without an aneurysmal dilatation. The esophagus is unremarkable. LYMPH NODES: Scattered lymph nodes are noted with normal fatty hila within the mediastinum. These are likely reactive in nature. No hilar or axillary lymphadenopathy. LUNGS AND PLEURA: Bilateral pleural effusions are noted, similar to that seen on the prior exam. Bilateral lower lobe consolidation, also stable from the prior study. 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 No sizable parenchymal nodule is noted. No pneumothorax. SOFT TISSUES/BONES: Stable compression deformity at T11. No acute abnormality of the bones or soft tissues. UPPER ABDOMEN: Limited images of the upper abdomen demonstrate nodular changes of the liver, consistent with underlying cirrhosis. IMPRESSION: 1. Bilateral pleural effusions, similar to prior exam. 2. Bilateral lower lobe consolidation, stable from prior study. Electronically signed by: Oneil Devonshire MD 05/04/2024 12:44 AM EST RP Workstation: [id number]   DG CHEST PORT 1 VIEW Result Date: 05/03/2024 EXAM: 1 VIEW(S) XRAY OF THE CHEST 05/03/2024 08:07:00 AM COMPARISON: 05/01/2024 CLINICAL HISTORY: Hypoxic respiratory failure (HCC) FINDINGS: LUNGS AND PLEURA: Mild pulmonary edema and interstitial opacities improved. Small bilateral pleural effusions, left greater than right. Bibasilar opacities, likely atelectasis. No pneumothorax. HEART AND MEDIASTINUM: Cardiomegaly. Aortic atherosclerosis. BONES AND SOFT TISSUES:  Degenerative changes of left shoulder. No acute osseous abnormality. IMPRESSION: 1. Mild pulmonary edema and interstitial opacities, improved. 2. Small bilateral pleural effusions, left greater than right. 3. Cardiomegaly and aortic atherosclerosis. Electronically signed by: Evalene Coho MD 05/03/2024 08:16 AM EST RP Workstation: [id number]   IR US  CHEST Result Date: 05/02/2024 INDICATION: 83 year old female. History of CHF. Found to have bilateral pleural effusions. Request is for therapeutic right-sided the thoracentesis EXAM: CHEST ULTRASOUND COMPARISON:  Chest x-ray dated May 01, 2024 FINDINGS:  Small left-sided pleural effusion. IMPRESSION: Small left-sided pleural effusion. Patient declined the procedure at this time. Read by: Delon Beagle, NP Electronically Signed   By: Cordella Banner   On: 05/02/2024 16:21    Cardiac Studies   TTE 04/30/24:  IMPRESSIONS     1. Left ventricular ejection fraction, by estimation, is >75%. The left  ventricle has hyperdynamic function. The left ventricle has no regional  wall motion abnormalities. Left ventricular diastolic parameters are  indeterminate.   2. Right ventricular systolic function is normal. The right ventricular  size is normal. There is mildly elevated pulmonary artery systolic  pressure.   3. Left atrial size was severely dilated.   4. Right atrial size was mildly dilated.   5. Moderate pleural effusion in the left lateral region.   6. The mitral valve is normal in structure. Mild to moderate mitral valve  regurgitation. No evidence of mitral stenosis.   7. The aortic valve is calcified. Aortic valve regurgitation is not  visualized. Mild aortic valve stenosis.   8. The inferior vena cava is dilated in size with >50% respiratory  variability, suggesting right atrial pressure of 8 mmHg.   Patient Profile   Ms. Lykins is a 83 y.o. female with a PMH of persistent AF, HFpEF, HLD, Graves' disease who was admitted for  acute hypoxic respiratory failure in the setting of acute on chronic HFpEF exacerbation and atrial fibrillation with RVR.  Cardiology was consulted for the evaluation of her cardiovascular issues.  Assessment & Plan    #Acute on Chronic HFpEF Exacerbation #Acute Hypoxic Respiratory Failure - Patient admitted with volume overload in the setting of decompensated heart failure. - Diuresis has been challenging due to low blood pressures. - Ultimately the patient remains volume overloaded and does need to get additional fluid removed. - Will continue ongoing diuresis. Continue IV Lasix  to 40 mg twice daily Strict I's and O's, daily weights S/p thoracentesis R 12/14, s/p L thoracentesis 12/15  #Atrial Fibrillation with RVR - Patient found to be in atrial fibrillation with RVR on admission. - She underwent unsuccessful DCCV on 12/1 and was discharged at an outside hospital on p.o. Amio. - Currently she is tolerating IV amiodarone  well; however, she remains in A-fib with RVR. - Her oxygen requirements are now improving and I think she would be a safer candidate to undergo TEE plus DCCV.  Ultimately I do not think I will continue to make much improvement in her overall respiratory status without getting her into NSR. Discontinue IV amiodarone  due to extravasation Transition to p.o. Amio for ongoing load Continue Eliquis  5 mg twice daily N.p.o. at midnight for TEE plus DCCV tomorrow Maintain K>4, Mg>2 Maintain telemetry Daily ECG for QTc monitoring  #LUE Amiodarone  Extravasation - Developed left upper extremity pain and swelling overnight with concern for amiodarone  extravasation. - I checked the patient's MAR and confirm with pharmacy that the patient did in fact receive hyaluronidase  for treatment. - She still has some left arm erythema, swelling, and tenderness, but without evidence of skin necrosis. - I instructed the patient to be vigilant regarding her arm symptoms and to notify me if  things worsen. Discontinuing IV amiodarone  as described above S/p hyaluronidase  12/16 Supportive care with warm compresses to upper extremity  #HLD Continue atorvastatin  20 mg daily    For questions or updates, please contact [facility] Please consult [email or web address] for contact info under    Informed Consent   Shared Decision Making/Informed Consent   The  risks [stroke, cardiac arrhythmias rarely resulting in the need for a temporary or permanent pacemaker, skin irritation or burns, esophageal damage, perforation (1:10,000 risk), bleeding, pharyngeal hematoma as well as other potential complications associated with conscious sedation including aspiration, arrhythmia, respiratory failure and death], benefits (treatment guidance, restoration of normal sinus rhythm, diagnostic support) and alternatives of a transesophageal echocardiogram guided cardioversion were discussed in detail with Ms. Whitford and she is willing to proceed.       Signed, Georganna Archer, MD  05/04/2024, 1:41 PM

## 2024-05-04 NOTE — Progress Notes (Signed)
 PROGRESS NOTE    Carla Petersen  FMW:[id number] DOB: 1941/04/20 DOA: 04/29/2024 PCP: Ransom Other, MD   Brief Narrative:  Carla Petersen is a 83 y.o. female with medical history significant of CHF, Graves who presented to the emergency department due to heart palpitations -noted to be in A-fib/flutter with worsening shortness of breath orthopnea and dyspnea with exertion consistent with heart failure exacerbation.  Cardiology called in consult, hospitalist called to admit.  Assessment & Plan:   Principal Problem:   Acute exacerbation of CHF (congestive heart failure) (HCC) Active Problems:   Acute on chronic diastolic heart failure (HCC)   Acute respiratory failure with hypoxia (HCC)   Atrial fibrillation with rapid ventricular response (HCC)   Prolonged QT interval  Acute heart failure exacerbation, preserved ejection fraction - Cardiology following, appreciate insight recommendations - Current goal-directed medical therapy limited by patient's hypotension   - Lasix  dose decreased per cardiology - Status post right thoracentesis 12/14, left thoracentesis on hold given improvement in effusion - Oxygen weaned to 3L nasal cannula, goal saturation is 88 to 91% at this time - Repeat CT chest notes bilateral ongoing effusions - Lights criteria not met (although pleural/serum LDH ratio 0.58(cutoff 0.6)) - consistent with heart failure exacerbation - Tachycardia ongoing but generally rate controlled, continue amiodarone  - Continue eliquis  - Repeat echocardiogram 12/13 shows EF greater than 75% with hyperdynamic function indeterminate diastolic parameters  Acute hypoxic respiratory failure secondary to above, ongoing -Chest x-ray notable for bilateral pleural effusions, diuretics ongoing as above -Oxygen supplementation initially 6 L nasal cannula - peaked at 10L HFNC - wean as tolerated  Paroxysmal A-fib -cardiology planning for DCCV in the next 24 hours in hopes to improve cardiac  output, continue Eliquis , amiodarone    Hyperlipidemia-continue Lipitor Hypertension -metoprolol  discontinued, continue Lasix  as above Hypothyroidism - Continue levothyroxine  Hypokalemia-ongoing in the setting of diuresis, replete as appropriate  DVT prophylaxis: SCDs Start: 04/29/24 2013 apixaban  (ELIQUIS ) tablet 5 mg Code Status:   Code Status: Full Code Family Communication: Daughter at bedside  Status is: Inpatient  Dispo: The patient is from: Home              Anticipated d/c is to: Home              Anticipated d/c date is: 48 to 72 hours              Patient currently not medically stable for discharge  Consultants:  Cardiology  Procedures:  None  Antimicrobials:  None  Subjective: No acute issues or events overnight, respiratory status improving somewhat, patient more ambulatory over the past 48 hours, continue to encourage increased activity as tolerated.  Denies nausea vomit diarrhea constipation headache fevers chills chest pain.  Objective: Vitals:   05/03/24 2032 05/03/24 2300 05/04/24 0319 05/04/24 0326  BP: 106/68 106/66  (!) 104/54  Pulse: (!) 120 (!) 101  75  Resp: 18 18  15   Temp: 98 F (36.7 C) 97.9 F (36.6 C)  98 F (36.7 C)  TempSrc: Oral Oral  Oral  SpO2: 90% 92%  92%  Weight:   77 kg   Height:        Intake/Output Summary (Last 24 hours) at 05/04/2024 0732 Last data filed at 05/03/2024 2300 Gross per 24 hour  Intake 480 ml  Output 1875 ml  Net -1395 ml   Filed Weights   05/02/24 0400 05/03/24 0500 05/04/24 0319  Weight: 72.7 kg 77.6 kg 77 kg    Examination:  General:  Pleasantly resting in bed, No acute distress. HEENT:  Normocephalic atraumatic.  Sclerae nonicteric, noninjected.  Extraocular movements intact bilaterally. Neck:  Without mass or deformity.  Trachea is midline. Lungs: Bibasilar rales. Heart:  Regular rate and rhythm.  Without murmurs, rubs, or gallops. Abdomen:  Soft, nontender, nondistended.  Without guarding or  rebound. Extremities: Without cyanosis, clubbing, 1+ edema bilateral lower extremities. Skin:  Warm and dry: noted pressure injury Wound 04/30/24 1250 Pressure Injury Buttocks Medial;Bilateral Stage 1 -  Intact skin with non-blanchable redness of a localized area usually over a bony prominence. (Active)   Data Reviewed: I have personally reviewed following labs and imaging studies  CBC: Recent Labs  Lab 04/29/24 1546 04/29/24 1713 04/30/24 0342 05/03/24 1027  WBC 12.5*  --  9.6 9.1  NEUTROABS  --   --   --  7.5  HGB 13.6 14.6  14.3 12.6 13.3  HCT 42.4 43.0  42.0 39.9 41.0  MCV 96.4  --  96.1 93.4  PLT 234  --  230 219   Basic Metabolic Panel: Recent Labs  Lab 04/29/24 1957 04/30/24 0342 05/01/24 0325 05/02/24 0343 05/03/24 0509  NA 140 141 139 138 136  K 3.7 4.1 3.1* 3.7 3.2*  CL 95* 99 91* 95* 91*  CO2 34* 34* 36* 38* 38*  GLUCOSE 112* 112* 112* 97 112*  BUN 22 18 17 16 16   CREATININE 1.13* 1.12* 1.14* 1.20* 1.15*  CALCIUM  8.6* 8.5* 8.1* 8.1* 8.0*  MG 2.6* 2.1 2.0 1.8 2.0   GFR: Estimated Creatinine Clearance: 35.1 mL/min (A) (by C-G formula based on SCr of 1.15 mg/dL (H)).  Coagulation Profile: Recent Labs  Lab 04/30/24 0342  INR 2.3*   Sepsis Labs: Recent Labs  Lab 04/29/24 1725  LATICACIDVEN 1.7   Recent Results (from the past 240 hours)  MRSA Next Gen by PCR, Nasal     Status: None   Collection Time: 04/30/24 12:50 PM   Specimen: Nasal Mucosa; Nasal Swab  Result Value Ref Range Status   MRSA by PCR Next Gen NOT DETECTED NOT DETECTED Final    Comment: (NOTE) The GeneXpert MRSA Assay (FDA approved for NASAL specimens only), is one component of a comprehensive MRSA colonization surveillance program. It is not intended to diagnose MRSA infection nor to guide or monitor treatment for MRSA infections. Test performance is not FDA approved in patients less than 35 years old. Performed at [facility], [address], [place], [place] [address]    Pleural fluid culture w Gram Stain     Status: None (Preliminary result)   Collection Time: 05/01/24  2:04 PM   Specimen: Pleural Fluid  Result Value Ref Range Status   Specimen Description PLEURAL  Final   Special Requests NONE  Final   Gram Stain NO WBC SEEN NO ORGANISMS SEEN   Final   Culture   Final    NO GROWTH 2 DAYS Performed at [facility], [address], [place], [place] [address]    Report Status PENDING  Incomplete     Radiology Studies: CT CHEST WO CONTRAST Result Date: 05/04/2024 EXAM: CT CHEST WITHOUT CONTRAST 05/04/2024 12:32:18 AM TECHNIQUE: CT of the chest was performed without the administration of intravenous contrast. Multiplanar reformatted images are provided for review. Automated exposure control, iterative reconstruction, and/or weight based adjustment of the mA/kV was utilized to reduce the radiation dose to as low as reasonably achievable. COMPARISON: Chest x-ray from the previous day, CT from 04/29/2024. CLINICAL  HISTORY: Clinical history is pleural effusion and shortness of breath FINDINGS: MEDIASTINUM: Heart: No cardiac enlargement is seen. Coronary calcifications are noted. Pericardium is unremarkable. The central airways are clear. Atherosclerotic calcifications of the aorta are noted without an aneurysmal dilatation. The esophagus is unremarkable. LYMPH NODES: Scattered lymph nodes are noted with normal fatty hila within the mediastinum. These are likely reactive in nature. No hilar or axillary lymphadenopathy. LUNGS AND PLEURA: Bilateral pleural effusions are noted, similar to that seen on the prior exam. Bilateral lower lobe consolidation, also stable from the prior study. 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 No sizable parenchymal nodule is noted. No pneumothorax. SOFT TISSUES/BONES: Stable compression deformity at T11. No acute abnormality of the bones or soft tissues. UPPER ABDOMEN: Limited images of the upper abdomen  demonstrate nodular changes of the liver, consistent with underlying cirrhosis. IMPRESSION: 1. Bilateral pleural effusions, similar to prior exam. 2. Bilateral lower lobe consolidation, stable from prior study. Electronically signed by: Oneil Devonshire MD 05/04/2024 12:44 AM EST RP Workstation: [id number]   DG CHEST PORT 1 VIEW Result Date: 05/03/2024 EXAM: 1 VIEW(S) XRAY OF THE CHEST 05/03/2024 08:07:00 AM COMPARISON: 05/01/2024 CLINICAL HISTORY: Hypoxic respiratory failure (HCC) FINDINGS: LUNGS AND PLEURA: Mild pulmonary edema and interstitial opacities improved. Small bilateral pleural effusions, left greater than right. Bibasilar opacities, likely atelectasis. No pneumothorax. HEART AND MEDIASTINUM: Cardiomegaly. Aortic atherosclerosis. BONES AND SOFT TISSUES: Degenerative changes of left shoulder. No acute osseous abnormality. IMPRESSION: 1. Mild pulmonary edema and interstitial opacities, improved. 2. Small bilateral pleural effusions, left greater than right. 3. Cardiomegaly and aortic atherosclerosis. Electronically signed by: Evalene Coho MD 05/03/2024 08:16 AM EST RP Workstation: [id number]   IR US  CHEST Result Date: 05/02/2024 INDICATION: 83 year old female. History of CHF. Found to have bilateral pleural effusions. Request is for therapeutic right-sided the thoracentesis EXAM: CHEST ULTRASOUND COMPARISON:  Chest x-ray dated May 01, 2024 FINDINGS: Small left-sided pleural effusion. IMPRESSION: Small left-sided pleural effusion. Patient declined the procedure at this time. Read by: Delon Beagle, NP Electronically Signed   By: Cordella Banner   On: 05/02/2024 16:21   Scheduled Meds:  apixaban   5 mg Oral BID   atorvastatin   20 mg Oral Daily   furosemide   40 mg Intravenous BID   levothyroxine   25 mcg Oral Q0600   Continuous Infusions:  amiodarone  60 mg/hr (05/04/24 0328)    LOS: 5 days   Time spent:  Elsie JAYSON Montclair, DO [facility]  If 7PM-7AM, please  contact night-coverage [email or web address]  05/04/2024, 7:32 AM

## 2024-05-04 NOTE — H&P (View-Only) (Signed)
 Rounding Note   Patient Name: Carla Petersen Date of Encounter: 05/04/2024  [facility] Cardiologist: Lonni LITTIE Nanas, MD   Subjective - Underwent chest CT overnight which demonstrated bilateral pleural effusions that looked unchanged - Her left upper extremity IV infiltrated with amiodarone  and she was treated with hyaluronidase .  The IV was removed, but she reports ongoing left upper extremity pain - She feels like her breathing is getting better and continues to deny any chest discomfort. - Remains in atrial fibrillation with RVR.  Scheduled Meds:  amiodarone   200 mg Oral BID   Followed by   [name] ON 05/09/2024] amiodarone   200 mg Oral Daily   apixaban   5 mg Oral BID   atorvastatin   20 mg Oral Daily   furosemide   40 mg Intravenous BID   levothyroxine   25 mcg Oral Q0600   Continuous Infusions:   PRN Meds: acetaminophen  **OR** acetaminophen , ondansetron  **OR** ondansetron  (ZOFRAN ) IV, mouth rinse   Vital Signs  Vitals:   05/04/24 0805 05/04/24 1200 05/04/24 1213 05/04/24 1227  BP: (!) 106/59   110/61  Pulse: 84   (!) 107  Resp: 20   20  Temp: 98.1 F (36.7 C) (P) 98 F (36.7 C)  98 F (36.7 C)  TempSrc: Oral   Oral  SpO2: 90%  93% 95%  Weight:      Height:        Intake/Output Summary (Last 24 hours) at 05/04/2024 1341 Last data filed at 05/04/2024 1200 Gross per 24 hour  Intake 120 ml  Output 2250 ml  Net -2130 ml      05/04/2024    3:19 AM 05/03/2024    5:00 AM 05/02/2024    4:00 AM  Last 3 Weights  Weight (lbs) 169 lb 12.1 oz 171 lb 1.2 oz 160 lb 4.4 oz  Weight (kg) 77 kg 77.6 kg 72.7 kg      Telemetry Atrial fibrillation with RVR- Personally Reviewed  ECG  No new ECG  Physical Exam  GEN: No acute distress.   Neck: no JVD Cardiac: Tachycardic with irregularly irregular rhythm, II/VI systolic murmur, no rubs, or gallops.  Respiratory: Bibasilar rales, no wheezes or rhonchi GI: Soft, nontender, non-distended  MS: 1+  pitting edema bilaterally (improving); left upper extremity with erythema and tenderness to palpation to the elbow Neuro:  Nonfocal  Psych: Normal affect   Labs High Sensitivity Troponin:   Recent Labs  Lab 04/29/24 1624 04/29/24 1957  TROPONINIHS 7 8   No results for input(s): TRNPT in the last 720 hours.     Chemistry Recent Labs  Lab 04/30/24 0815 05/01/24 0325 05/01/24 1745 05/02/24 0343 05/03/24 0509  NA  --  139  --  138 136  K  --  3.1*  --  3.7 3.2*  CL  --  91*  --  95* 91*  CO2  --  36*  --  38* 38*  GLUCOSE  --  112*  --  97 112*  BUN  --  17  --  16 16  CREATININE  --  1.14*  --  1.20* 1.15*  CALCIUM   --  8.1*  --  8.1* 8.0*  MG  --  2.0  --  1.8 2.0  PROT 6.4*  --  6.7  --   --   ALBUMIN 2.5*  --  2.5*  --   --   AST 21  --   --   --   --   ALT 17  --   --   --   --  ALKPHOS 128*  --   --   --   --   BILITOT 1.1  --   --   --   --   GFRNONAA  --  47*  --  44* 47*  ANIONGAP  --  12  --  5 7    Lipids No results for input(s): CHOL, TRIG, HDL, LABVLDL, LDLCALC, CHOLHDL in the last 168 hours.  Hematology Recent Labs  Lab 04/29/24 1546 04/29/24 1713 04/30/24 0342 05/03/24 1027  WBC 12.5*  --  9.6 9.1  RBC 4.40  --  4.15 4.39  HGB 13.6 14.6  14.3 12.6 13.3  HCT 42.4 43.0  42.0 39.9 41.0  MCV 96.4  --  96.1 93.4  MCH 30.9  --  30.4 30.3  MCHC 32.1  --  31.6 32.4  RDW 14.3  --  14.6 14.4  PLT 234  --  230 219   Thyroid  No results for input(s): TSH, FREET4 in the last 168 hours.  BNP Recent Labs  Lab 04/29/24 1546 05/01/24 0325  BNP 584.1* 396.0*    DDimer No results for input(s): DDIMER in the last 168 hours.   Radiology  CT CHEST WO CONTRAST Result Date: 05/04/2024 EXAM: CT CHEST WITHOUT CONTRAST 05/04/2024 12:32:18 AM TECHNIQUE: CT of the chest was performed without the administration of intravenous contrast. Multiplanar reformatted images are provided for review. Automated exposure control, iterative reconstruction,  and/or weight based adjustment of the mA/kV was utilized to reduce the radiation dose to as low as reasonably achievable. COMPARISON: Chest x-ray from the previous day, CT from 04/29/2024. CLINICAL HISTORY: Clinical history is pleural effusion and shortness of breath FINDINGS: MEDIASTINUM: Heart: No cardiac enlargement is seen. Coronary calcifications are noted. Pericardium is unremarkable. The central airways are clear. Atherosclerotic calcifications of the aorta are noted without an aneurysmal dilatation. The esophagus is unremarkable. LYMPH NODES: Scattered lymph nodes are noted with normal fatty hila within the mediastinum. These are likely reactive in nature. No hilar or axillary lymphadenopathy. LUNGS AND PLEURA: Bilateral pleural effusions are noted, similar to that seen on the prior exam. Bilateral lower lobe consolidation, also stable from the prior study. 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 No sizable parenchymal nodule is noted. No pneumothorax. SOFT TISSUES/BONES: Stable compression deformity at T11. No acute abnormality of the bones or soft tissues. UPPER ABDOMEN: Limited images of the upper abdomen demonstrate nodular changes of the liver, consistent with underlying cirrhosis. IMPRESSION: 1. Bilateral pleural effusions, similar to prior exam. 2. Bilateral lower lobe consolidation, stable from prior study. Electronically signed by: Oneil Devonshire MD 05/04/2024 12:44 AM EST RP Workstation: [id number]   DG CHEST PORT 1 VIEW Result Date: 05/03/2024 EXAM: 1 VIEW(S) XRAY OF THE CHEST 05/03/2024 08:07:00 AM COMPARISON: 05/01/2024 CLINICAL HISTORY: Hypoxic respiratory failure (HCC) FINDINGS: LUNGS AND PLEURA: Mild pulmonary edema and interstitial opacities improved. Small bilateral pleural effusions, left greater than right. Bibasilar opacities, likely atelectasis. No pneumothorax. HEART AND MEDIASTINUM: Cardiomegaly. Aortic atherosclerosis. BONES AND SOFT TISSUES:  Degenerative changes of left shoulder. No acute osseous abnormality. IMPRESSION: 1. Mild pulmonary edema and interstitial opacities, improved. 2. Small bilateral pleural effusions, left greater than right. 3. Cardiomegaly and aortic atherosclerosis. Electronically signed by: Evalene Coho MD 05/03/2024 08:16 AM EST RP Workstation: [id number]   IR US  CHEST Result Date: 05/02/2024 INDICATION: 83 year old female. History of CHF. Found to have bilateral pleural effusions. Request is for therapeutic right-sided the thoracentesis EXAM: CHEST ULTRASOUND COMPARISON:  Chest x-ray dated May 01, 2024 FINDINGS:  Small left-sided pleural effusion. IMPRESSION: Small left-sided pleural effusion. Patient declined the procedure at this time. Read by: Delon Beagle, NP Electronically Signed   By: Cordella Banner   On: 05/02/2024 16:21    Cardiac Studies   TTE 04/30/24:  IMPRESSIONS     1. Left ventricular ejection fraction, by estimation, is >75%. The left  ventricle has hyperdynamic function. The left ventricle has no regional  wall motion abnormalities. Left ventricular diastolic parameters are  indeterminate.   2. Right ventricular systolic function is normal. The right ventricular  size is normal. There is mildly elevated pulmonary artery systolic  pressure.   3. Left atrial size was severely dilated.   4. Right atrial size was mildly dilated.   5. Moderate pleural effusion in the left lateral region.   6. The mitral valve is normal in structure. Mild to moderate mitral valve  regurgitation. No evidence of mitral stenosis.   7. The aortic valve is calcified. Aortic valve regurgitation is not  visualized. Mild aortic valve stenosis.   8. The inferior vena cava is dilated in size with >50% respiratory  variability, suggesting right atrial pressure of 8 mmHg.   Patient Profile   Ms. Lykins is a 83 y.o. female with a PMH of persistent AF, HFpEF, HLD, Graves' disease who was admitted for  acute hypoxic respiratory failure in the setting of acute on chronic HFpEF exacerbation and atrial fibrillation with RVR.  Cardiology was consulted for the evaluation of her cardiovascular issues.  Assessment & Plan    #Acute on Chronic HFpEF Exacerbation #Acute Hypoxic Respiratory Failure - Patient admitted with volume overload in the setting of decompensated heart failure. - Diuresis has been challenging due to low blood pressures. - Ultimately the patient remains volume overloaded and does need to get additional fluid removed. - Will continue ongoing diuresis. Continue IV Lasix  to 40 mg twice daily Strict I's and O's, daily weights S/p thoracentesis R 12/14, s/p L thoracentesis 12/15  #Atrial Fibrillation with RVR - Patient found to be in atrial fibrillation with RVR on admission. - She underwent unsuccessful DCCV on 12/1 and was discharged at an outside hospital on p.o. Amio. - Currently she is tolerating IV amiodarone  well; however, she remains in A-fib with RVR. - Her oxygen requirements are now improving and I think she would be a safer candidate to undergo TEE plus DCCV.  Ultimately I do not think I will continue to make much improvement in her overall respiratory status without getting her into NSR. Discontinue IV amiodarone  due to extravasation Transition to p.o. Amio for ongoing load Continue Eliquis  5 mg twice daily N.p.o. at midnight for TEE plus DCCV tomorrow Maintain K>4, Mg>2 Maintain telemetry Daily ECG for QTc monitoring  #LUE Amiodarone  Extravasation - Developed left upper extremity pain and swelling overnight with concern for amiodarone  extravasation. - I checked the patient's MAR and confirm with pharmacy that the patient did in fact receive hyaluronidase  for treatment. - She still has some left arm erythema, swelling, and tenderness, but without evidence of skin necrosis. - I instructed the patient to be vigilant regarding her arm symptoms and to notify me if  things worsen. Discontinuing IV amiodarone  as described above S/p hyaluronidase  12/16 Supportive care with warm compresses to upper extremity  #HLD Continue atorvastatin  20 mg daily    For questions or updates, please contact [facility] Please consult [email or web address] for contact info under    Informed Consent   Shared Decision Making/Informed Consent   The  risks [stroke, cardiac arrhythmias rarely resulting in the need for a temporary or permanent pacemaker, skin irritation or burns, esophageal damage, perforation (1:10,000 risk), bleeding, pharyngeal hematoma as well as other potential complications associated with conscious sedation including aspiration, arrhythmia, respiratory failure and death], benefits (treatment guidance, restoration of normal sinus rhythm, diagnostic support) and alternatives of a transesophageal echocardiogram guided cardioversion were discussed in detail with Ms. Whitford and she is willing to proceed.       Signed, Georganna Archer, MD  05/04/2024, 1:41 PM

## 2024-05-04 NOTE — Plan of Care (Signed)
°  Problem: Clinical Measurements: Goal: Will remain free from infection Outcome: Progressing   Problem: Nutrition: Goal: Adequate nutrition will be maintained Outcome: Progressing   Problem: Coping: Goal: Level of anxiety will decrease Outcome: Progressing

## 2024-05-05 ENCOUNTER — Encounter (HOSPITAL_COMMUNITY)
Admission: EM | Disposition: A | Discharge status: Home / Self Care | Payer: Self-pay | Source: Ambulatory Visit | Attending: Internal Medicine

## 2024-05-05 ENCOUNTER — Inpatient Hospital Stay (HOSPITAL_COMMUNITY): Admitting: Anesthesiology

## 2024-05-05 ENCOUNTER — Inpatient Hospital Stay (HOSPITAL_COMMUNITY)

## 2024-05-05 ENCOUNTER — Encounter (HOSPITAL_COMMUNITY): Payer: Self-pay | Admitting: Internal Medicine

## 2024-05-05 DIAGNOSIS — I4891 Unspecified atrial fibrillation: Secondary | ICD-10-CM

## 2024-05-05 DIAGNOSIS — I10 Essential (primary) hypertension: Secondary | ICD-10-CM

## 2024-05-05 DIAGNOSIS — I340 Nonrheumatic mitral (valve) insufficiency: Secondary | ICD-10-CM

## 2024-05-05 DIAGNOSIS — J9601 Acute respiratory failure with hypoxia: Secondary | ICD-10-CM | POA: Diagnosis not present

## 2024-05-05 DIAGNOSIS — E039 Hypothyroidism, unspecified: Secondary | ICD-10-CM

## 2024-05-05 DIAGNOSIS — I4819 Other persistent atrial fibrillation: Secondary | ICD-10-CM | POA: Diagnosis not present

## 2024-05-05 DIAGNOSIS — E876 Hypokalemia: Secondary | ICD-10-CM | POA: Diagnosis not present

## 2024-05-05 DIAGNOSIS — I5033 Acute on chronic diastolic (congestive) heart failure: Secondary | ICD-10-CM | POA: Diagnosis not present

## 2024-05-05 DIAGNOSIS — L8990 Pressure ulcer of unspecified site, unspecified stage: Secondary | ICD-10-CM | POA: Insufficient documentation

## 2024-05-05 DIAGNOSIS — I5031 Acute diastolic (congestive) heart failure: Secondary | ICD-10-CM | POA: Diagnosis not present

## 2024-05-05 HISTORY — PX: CARDIOVERSION: EP1203

## 2024-05-05 HISTORY — PX: TRANSESOPHAGEAL ECHOCARDIOGRAM (CATH LAB): EP1270

## 2024-05-05 LAB — ECHO TEE
MV M vel: 5.07 m/s
MV Peak grad: 102.8 mmHg

## 2024-05-05 LAB — BASIC METABOLIC PANEL WITH GFR
Anion gap: 8 (ref 5–15)
Anion gap: 8 (ref 5–15)
BUN: 15 mg/dL (ref 8–23)
BUN: 16 mg/dL (ref 8–23)
CO2: 40 mmol/L — ABNORMAL HIGH (ref 22–32)
CO2: 38 mmol/L — ABNORMAL HIGH (ref 22–32)
Calcium: 8.6 mg/dL — ABNORMAL LOW (ref 8.9–10.3)
Calcium: 8.9 mg/dL (ref 8.9–10.3)
Chloride: 91 mmol/L — ABNORMAL LOW (ref 98–111)
Chloride: 93 mmol/L — ABNORMAL LOW (ref 98–111)
Creatinine, Ser: 1.1 mg/dL — ABNORMAL HIGH (ref 0.44–1.00)
Creatinine, Ser: 1.18 mg/dL — ABNORMAL HIGH (ref 0.44–1.00)
GFR, Estimated: 49 mL/min — ABNORMAL LOW (ref >60)
GFR, Estimated: 45 mL/min — ABNORMAL LOW (ref >60)
Glucose, Bld: 102 mg/dL — ABNORMAL HIGH (ref 70–99)
Glucose, Bld: 188 mg/dL — ABNORMAL HIGH (ref 70–99)
Potassium: 2.9 mmol/L — ABNORMAL LOW (ref 3.5–5.1)
Potassium: 3.8 mmol/L (ref 3.5–5.1)
Sodium: 139 mmol/L (ref 135–145)
Sodium: 139 mmol/L (ref 135–145)

## 2024-05-05 LAB — RESP PANEL BY RT-PCR (RSV, FLU A&B, COVID)  RVPGX2
Influenza A by PCR: NEGATIVE
Influenza B by PCR: NEGATIVE
Resp Syncytial Virus by PCR: NEGATIVE
SARS Coronavirus 2 by RT PCR: NEGATIVE

## 2024-05-05 LAB — MAGNESIUM: Magnesium: 1.9 mg/dL (ref 1.7–2.4)

## 2024-05-05 SURGERY — TRANSESOPHAGEAL ECHOCARDIOGRAM (TEE) (CATHLAB)
Anesthesia: General

## 2024-05-05 MED ORDER — POTASSIUM CHLORIDE 20 MEQ PO PACK
40.0000 meq | PACK | Freq: Once | ORAL | Status: AC
  Administered 2024-05-05: 16:00:00 40 meq via ORAL
  Filled 2024-05-05: qty 2

## 2024-05-05 MED ORDER — PHENYLEPHRINE HCL-NACL 20-0.9 MG/250ML-% IV SOLN
INTRAVENOUS | Status: DC | PRN
  Administered 2024-05-05: 11:00:00 40 ug/min via INTRAVENOUS

## 2024-05-05 MED ORDER — FUROSEMIDE 10 MG/ML IJ SOLN
40.0000 mg | Freq: Once | INTRAMUSCULAR | Status: AC
  Administered 2024-05-05: 19:00:00 40 mg via INTRAVENOUS
  Filled 2024-05-05: qty 4

## 2024-05-05 MED ORDER — LACTATED RINGERS IV SOLN: INTRAVENOUS | Status: DC | PRN

## 2024-05-05 MED ORDER — POTASSIUM CHLORIDE 10 MEQ/100ML IV SOLN
10.0000 meq | INTRAVENOUS | Status: AC
  Administered 2024-05-05 (×4): 10 meq via INTRAVENOUS
  Filled 2024-05-05 (×4): qty 100

## 2024-05-05 MED ORDER — PHENYLEPHRINE 80 MCG/ML (10ML) SYRINGE FOR IV PUSH (FOR BLOOD PRESSURE SUPPORT)
PREFILLED_SYRINGE | INTRAVENOUS | Status: DC | PRN
  Administered 2024-05-05 (×5): 160 ug via INTRAVENOUS

## 2024-05-05 MED ORDER — PROPOFOL 500 MG/50ML IV EMUL
INTRAVENOUS | Status: DC | PRN
  Administered 2024-05-05: 11:00:00 150 ug/kg/min via INTRAVENOUS

## 2024-05-05 SURGICAL SUPPLY — 1 items: PAD DEFIB RADIO PHYSIO CONN (PAD) ×1 IMPLANT

## 2024-05-05 NOTE — Progress Notes (Signed)
 [facility] night cross cover note:   I was notified by the patient's RN that this morning's BMP shows potassium level of 2.9, with RN also conveying that the patient is currently n.p.o. in preparation for DCCV that is planned for this morning.  Additional labs this morning notable for creatinine of 1.1.  As the patient is currently strict n.p.o., I have ordered potassium chloride  40 meq IV over 4 hours.      Eva Pore, DO Hospitalist

## 2024-05-05 NOTE — Consult Note (Signed)
 ELECTROPHYSIOLOGY CONSULT NOTE    Patient ID: Maysel Mccolm MRN: [id number], DOB/AGE: 1941/02/09 83 y.o.  Admit date: 04/29/2024 Date of Consult: 05/05/2024  Primary Physician: Ransom Other, MD Primary Cardiologist: Lonni LITTIE Nanas, MD  Electrophysiologist: New   Referring Provider: Dr. Floretta  Patient Profile: Carla Petersen is a 83 y.o. female with a history of Graves disease, HLD, prediabetes, breast cancer, HFpEF, persistent Afib on eliquis  and amiodarone  who is being seen today for the evaluation of AF with RVR and failed [facility] at the request of Dr. Floretta.  HPI:  Carla Petersen is a 83 y.o. female with complicated recent history.   On chart review on 04/12/2024 patient presented to [facility] in [place], [place] .  She presented with SOB and palpitations, noted be in AF with RVR with heart rates in the 140s.  Oxygen saturation was 83% in the prehospital setting, she was admitted and started on IV diltiazem .  Chest x-ray demonstrated moderate right sided pleural effusion and small left-sided pleural effusion.  CT chest negative for PE.  She was given IV Lasix  as well as initiated on antibiotic therapy.  EP was consulted, patient underwent cardioversion on 04/18/2024 but remained in atrial fibrillation, was started on amiodarone  drip then transition to oral amiodarone .  Patient had bilateral pleural effusions and pericardial effusion, BNP 249.  Patient was given IV Lasix , had right sided thoracentesis for pleural effusion with removal of 500 mL fluid on 04/13/24.  Echo per notes showed EF 55 to 60%.  Patient required oxygen during hospitalization, discharged on 2 L nasal cannula.    Patient was discharged 04/23/2024 with new supplemental O2 and on amiodarone  200 mg twice daily.   Seen in [facility] office 12/12 for follow up and found to have significant SOB with hypoxia and edema, but was also in NSR on EKG. Sent to hospital for diuresis and furtehr  evaluation for pleural effusions. Of note, K 2.7 on admission.   Pt has been diuresed and continued to be loaded on IV amiodarone  -> transitioned to po with extravasation.  Underwent R thoracentesis but left deferred with low volume.   Underwent TEE today with mild/mod MR, severe bi-atrial enlargement. Pleural effusion also noted. Underwent DCC x 3 with ERAF each time.   EP consulted with difficult to control AF.   Pt is feeling OK today post cardioversion. Hypotensive on phenylephrine .  Notes she was USOH at [facility] Visit end of October. Had tooth extraction and implant the first week of November complicated by infection. Received a second round of ABx and several days after starting is when her symptoms initially started.   Labs Potassium2.9* (12/18 0231) Magnesium   2.0 (12/16 0509) Creatinine, ser  1.10* (12/18 0231) PLT  219 (12/16 1027) HGB  13.3 (12/16 1027) WBC 9.1 (12/16 1027)  .    Past Medical History:  Diagnosis Date   CHF (congestive heart failure) (HCC)    Graves disease    Personal history of radiation therapy    Left Breast Cancer     Surgical History:  Past Surgical History:  Procedure Laterality Date   BREAST EXCISIONAL BIOPSY Left    BREAST EXCISIONAL BIOPSY Left    BREAST LUMPECTOMY Left 1995   MASTECTOMY Left    PR THORACENTESIS NEEDLE/CATH PLEURA W/IMAGING  05/01/2024     Medications Prior to Admission  Medication Sig Dispense Refill Last Dose/Taking   acetaminophen  (TYLENOL ) 500 MG tablet Take 500 mg by mouth daily as needed. PRN  Unknown   ALPRAZolam (XANAX) 0.5 MG tablet Take 0.5 mg by mouth daily as needed.   Unknown   amiodarone  (PACERONE ) 200 MG tablet Take 200 mg by mouth 2 (two) times daily.   04/29/2024   amLODipine  (NORVASC ) 5 MG tablet TAKE 1 TABLET BY MOUTH DAILY 90 tablet 3 04/29/2024   atorvastatin  (LIPITOR) 20 MG tablet TAKE 1 TABLET BY MOUTH DAILY 90 tablet 3 04/29/2024   calcium  carbonate (OS-CAL) 1250 (500 Ca) MG chewable  tablet Chew 1 tablet by mouth daily.   Past Week   diltiazem (CARDIZEM CD) 240 MG 24 hr capsule Take 240 mg by mouth daily.   04/29/2024   ELIQUIS  5 MG TABS tablet TAKE 1 TABLET BY MOUTH 2 TIMES A DAY 60 tablet 5 04/29/2024 at  9:30 AM   ferrous sulfate 325 (65 FE) MG EC tablet Take 325 mg by mouth daily with breakfast.   04/28/2024   furosemide  (LASIX ) 40 MG tablet Take 40 mg by mouth daily as needed.   Past Week   levothyroxine  (SYNTHROID , LEVOTHROID) 25 MCG tablet Take 25 mcg by mouth daily.   04/29/2024 Morning   metoprolol  tartrate (LOPRESSOR ) 50 MG tablet Take 50 mg by mouth 3 (three) times daily.   04/29/2024   Multiple Vitamins-Minerals (PRESERVISION AREDS 2+MULTI VIT) CAPS Take 1 capsule by mouth daily.   Past Week   Nutritional Supplements (VITAMIN D MAINTENANCE PO) Take 800 Units by mouth daily.   Past Week   Omega-3 Fatty Acids (FISH OIL CONCENTRATE) 300 MG CAPS Take 1 capsule by mouth daily.   Past Week   Vitamin D, Cholecalciferol, 25 MCG (1000 UT) CAPS Take 1 capsule by mouth daily.   Past Week    Inpatient Medications:   [MAR Hold] amiodarone   200 mg Oral BID   Followed by   [facility] Hold] amiodarone   200 mg Oral Daily   [MAR Hold] apixaban   5 mg Oral BID   [MAR Hold] atorvastatin   20 mg Oral Daily   [MAR Hold] levothyroxine   25 mcg Oral Q0600   [MAR Hold] potassium chloride   40 mEq Oral Once    Allergies: Allergies[1]  Family History  Problem Relation Age of Onset   Breast cancer Neg Hx      Physical Exam: Vitals:   05/05/24 1255 05/05/24 1258 05/05/24 1300 05/05/24 1303  BP: 97/67 (!) 96/50 (!) 95/50 (!) 80/60  Pulse: (!) 109 (!) 112 (!) 112 (!) 102  Resp: 19 (!) 23 16 (!) 21  Temp:      TempSrc:      SpO2: 91% 91% 91% 92%  Weight:      Height:        GEN- NAD, A&O x 3, normal affect HEENT: Normocephalic, atraumatic Lungs- Diminished throughout.  Heart- Irregularly irregular rate and rhythm, No M/G/R.  GI- Soft, NT, ND.  Extremities- No clubbing or  cyanosis. Soft dependent edema into upper thighs   EKG: 12/15 shows coarse fib vs AFL in 120s (personally reviewed)  TELEMETRY: AF RVR in 100-110s (personally reviewed)  Reports reviewed: Echo 04/30/2024  LVEF >75%, normal RV, Severe LAE, mild RAE, Mild/Mod MR, mild AS.  Assessment/Plan:  AF with RVR S/p failed DCC at OSH earlier this month, and again today Continue amiodarone  po 200 mg BID. Would not down titrate at this time, and consider higher oral dose, will discuss with MD.  Holding off on IV amiodarone  with extravasation requiring hyaluronidase .   Has received approx 5g of amio since 12/6; Initially  started on 12/1, suspect she is still somewhat short of a full load.  Not candidate for other AADs with on-going HFpEF, prolonged QT, and need for diuresis.  Would continue to manage pts acute issues and loading po amiodarone . She is unlikely to maintain NSR in the setting of ongoing respiratory failure and electrolyte imbalance.   Not a candidate for ablation at this time with ongoing respiratory failure, and on-going trigger of her acute illness.   Hypokalemia Potassium2.9* (12/18 0231) Magnesium   2.0 (12/16 0509) Creatinine, ser  1.10* (12/18 0231) Keep K > 4.0 and Mg > 2.0   Acute on chronic HFpEF Exacerbation Acute Hypoxic Respiratory Failure Pleural effusion On-going diuresis Still with high O2 requirement Right Thoracentesis 12/14 with of sero-sanguinous fluid removed. Left Thoracentesis 12/15 deferred with low volume  Recent Dental extraction and infection Unclear if contributing. Completed 2 abx courses.  No active signs of infection   For questions or updates, please contact [facility] Please consult [email or web address] for contact info under     Signed, Ozell Prentice Passey, PA-C  05/05/2024, 1:05 PM           [1]  Allergies Allergen Reactions   Latex Swelling   Penicillins Rash    Total body rash from ankles to neck.   Sulfa  Antibiotics Nausea And Vomiting   Pedi-Pre Tape Spray [Wound Dressing Adhesive] Rash    A band-aid caused rash on finger

## 2024-05-05 NOTE — Procedures (Signed)
° °  TRANSESOPHAGEAL ECHOCARDIOGRAM GUIDED DIRECT CURRENT CARDIOVERSION  NAME:  Carla Petersen    MRN: [id number] DOB:  09/01/1940    ADMIT DATE: 04/29/2024  INDICATIONS: Symptomatic atrial fibrillation  PROCEDURE:   Informed consent was obtained prior to the procedure. The risks, benefits and alternatives for the procedure were discussed and the patient comprehended these risks.  Risks include, but are not limited to, cough, sore throat, vomiting, nausea, somnolence, esophageal and stomach trauma or perforation, bleeding, low blood pressure, aspiration, pneumonia, infection, trauma to the teeth and death.    After a procedural time-out, the oropharynx was anesthetized and the patient was sedated by the anesthesia service. The transesophageal probe was inserted in the esophagus and stomach without difficulty and multiple views were obtained. Anesthesia was monitored by Dr. Boone.   COMPLICATIONS:    Complications: No complications Patient tolerated procedure well.  KEY FINDINGS:  Mild to moderate mitral regurgitation Severe Bi-atrial dilation. Patent left atrial appendage.  Full Report to follow.   CARDIOVERSION:     Indications:  Symptomatic Atrial Fibrillation  Procedure Details:  Once the TEE was complete, the patient had the defibrillator pads placed in the anterior and posterior position. Once an appropriate level of sedation was confirmed, the patient was cardioverted x 3 with 200J then 250 J, then 300 J of biphasic synchronized energy.  The patient converted to NSR for several beats before returning to atrial fibrillation.  There were no apparent complications.  The patient had normal neuro status and respiratory status post procedure with vitals stable as recorded elsewhere.  Adequate airway was maintained throughout and vital signs monitored per protocol.  Stanly Leavens, MD [place]  [facility]  11:41 AM

## 2024-05-05 NOTE — Anesthesia Postprocedure Evaluation (Signed)
 Anesthesia Post Note  Patient: Carla Petersen  Procedure(s) Performed: TRANSESOPHAGEAL ECHOCARDIOGRAM CARDIOVERSION     Patient location during evaluation: Endoscopy Anesthesia Type: General Level of consciousness: awake and alert Pain management: pain level controlled Vital Signs Assessment: post-procedure vital signs reviewed and stable Respiratory status: spontaneous breathing, nonlabored ventilation, respiratory function stable and patient connected to nasal cannula oxygen Cardiovascular status: blood pressure returned to baseline and stable Postop Assessment: no apparent nausea or vomiting Anesthetic complications: no   No notable events documented.  Last Vitals:  Vitals:   05/05/24 1340 05/05/24 1345  BP: (!) 97/59 (!) 91/49  Pulse: (!) 109 (!) 112  Resp: (!) 26 18  Temp:    SpO2: 92% 91%    Last Pain:  Vitals:   05/05/24 1150  TempSrc:   PainSc: 0-No pain                 Rome Ade

## 2024-05-05 NOTE — Interval H&P Note (Signed)
 History and Physical Interval Note:  05/05/2024 10:57 AM  Tyson Round  has presented today for surgery, with the diagnosis of afib.  The various methods of treatment have been discussed with the patient and family. After consideration of risks, benefits and other options for treatment, the patient has consented to  Procedures: TRANSESOPHAGEAL ECHOCARDIOGRAM (N/A) CARDIOVERSION (N/A) as a surgical intervention.  The patient's history has been reviewed, patient examined, no change in status, stable for surgery.  I have reviewed the patient's chart and labs.  Questions were answered to the patient's satisfaction.     Marjean Imperato A Shaye Elling

## 2024-05-05 NOTE — Progress Notes (Signed)
 Rounding Note   Patient Name: Carla Petersen Date of Encounter: 05/05/2024  [facility] Cardiologist: Lonni LITTIE Nanas, MD   Subjective - No acute events overnight - Patient's left arm pain has improved significantly - She also reports that her breathing is improving as well - Net -2.9 L  Scheduled Meds:  [MAR Hold] amiodarone   200 mg Oral BID   Followed by   [facility] Hold] amiodarone   200 mg Oral Daily   [MAR Hold] apixaban   5 mg Oral BID   [MAR Hold] atorvastatin   20 mg Oral Daily   [MAR Hold] levothyroxine   25 mcg Oral Q0600   [MAR Hold] potassium chloride   40 mEq Oral Once   Continuous Infusions:  sodium chloride  10 mL/hr at 05/04/24 1510    PRN Meds: [MAR Hold] acetaminophen  **OR** [MAR Hold] acetaminophen , [MAR Hold] ondansetron  **OR** [MAR Hold] ondansetron  (ZOFRAN ) IV, [MAR Hold] mouth rinse   Vital Signs  Vitals:   05/05/24 0400 05/05/24 0500 05/05/24 0754 05/05/24 1017  BP: 103/69  (!) 91/56 110/68  Pulse: (!) 109  95 (!) 113  Resp: 15  16 16   Temp: 97.9 F (36.6 C)  97.6 F (36.4 C) 97.6 F (36.4 C)  TempSrc: Oral  Axillary Tympanic  SpO2: 95%  94% 92%  Weight:  77.5 kg  76.7 kg  Height:    5' 3 (1.6 m)    Intake/Output Summary (Last 24 hours) at 05/05/2024 1043 Last data filed at 05/05/2024 0400 Gross per 24 hour  Intake 114.71 ml  Output 2700 ml  Net -2585.29 ml      05/05/2024   10:17 AM 05/05/2024    5:00 AM 05/04/2024    3:19 AM  Last 3 Weights  Weight (lbs) 169 lb 170 lb 13.7 oz 169 lb 12.1 oz  Weight (kg) 76.658 kg 77.5 kg 77 kg      Telemetry Atrial fibrillation with RVR- Personally Reviewed  ECG  No new ECG  Physical Exam  GEN: No acute distress.   Neck: no JVD Cardiac: Tachycardic with irregularly irregular rhythm, II/VI systolic murmur, no rubs, or gallops.  Respiratory: Bibasilar rales, no wheezes or rhonchi GI: Soft, nontender, non-distended  MS: No edema; left upper extremity with resolving  erythema Neuro:  Nonfocal  Psych: Normal affect   Labs High Sensitivity Troponin:   Recent Labs  Lab 04/29/24 1624 04/29/24 1957  TROPONINIHS 7 8   No results for input(s): TRNPT in the last 720 hours.     Chemistry Recent Labs  Lab 04/30/24 0815 05/01/24 0325 05/01/24 1745 05/02/24 0343 05/03/24 0509 05/05/24 0231  NA  --  139  --  138 136 139  K  --  3.1*  --  3.7 3.2* 2.9*  CL  --  91*  --  95* 91* 91*  CO2  --  36*  --  38* 38* 40*  GLUCOSE  --  112*  --  97 112* 102*  BUN  --  17  --  16 16 15   CREATININE  --  1.14*  --  1.20* 1.15* 1.10*  CALCIUM   --  8.1*  --  8.1* 8.0* 8.6*  MG  --  2.0  --  1.8 2.0  --   PROT 6.4*  --  6.7  --   --   --   ALBUMIN 2.5*  --  2.5*  --   --   --   AST 21  --   --   --   --   --  ALT 17  --   --   --   --   --   ALKPHOS 128*  --   --   --   --   --   BILITOT 1.1  --   --   --   --   --   GFRNONAA  --  47*  --  44* 47* 49*  ANIONGAP  --  12  --  5 7 8     Lipids No results for input(s): CHOL, TRIG, HDL, LABVLDL, LDLCALC, CHOLHDL in the last 168 hours.  Hematology Recent Labs  Lab 04/29/24 1546 04/29/24 1713 04/30/24 0342 05/03/24 1027  WBC 12.5*  --  9.6 9.1  RBC 4.40  --  4.15 4.39  HGB 13.6 14.6  14.3 12.6 13.3  HCT 42.4 43.0  42.0 39.9 41.0  MCV 96.4  --  96.1 93.4  MCH 30.9  --  30.4 30.3  MCHC 32.1  --  31.6 32.4  RDW 14.3  --  14.6 14.4  PLT 234  --  230 219   Thyroid  No results for input(s): TSH, FREET4 in the last 168 hours.  BNP Recent Labs  Lab 04/29/24 1546 05/01/24 0325  BNP 584.1* 396.0*    DDimer No results for input(s): DDIMER in the last 168 hours.   Radiology  CT CHEST WO CONTRAST Result Date: 05/04/2024 EXAM: CT CHEST WITHOUT CONTRAST 05/04/2024 12:32:18 AM TECHNIQUE: CT of the chest was performed without the administration of intravenous contrast. Multiplanar reformatted images are provided for review. Automated exposure control, iterative reconstruction, and/or weight  based adjustment of the mA/kV was utilized to reduce the radiation dose to as low as reasonably achievable. COMPARISON: Chest x-ray from the previous day, CT from 04/29/2024. CLINICAL HISTORY: Clinical history is pleural effusion and shortness of breath FINDINGS: MEDIASTINUM: Heart: No cardiac enlargement is seen. Coronary calcifications are noted. Pericardium is unremarkable. The central airways are clear. Atherosclerotic calcifications of the aorta are noted without an aneurysmal dilatation. The esophagus is unremarkable. LYMPH NODES: Scattered lymph nodes are noted with normal fatty hila within the mediastinum. These are likely reactive in nature. No hilar or axillary lymphadenopathy. LUNGS AND PLEURA: Bilateral pleural effusions are noted, similar to that seen on the prior exam. Bilateral lower lobe consolidation, also stable from the prior study. 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 No sizable parenchymal nodule is noted. No pneumothorax. SOFT TISSUES/BONES: Stable compression deformity at T11. No acute abnormality of the bones or soft tissues. UPPER ABDOMEN: Limited images of the upper abdomen demonstrate nodular changes of the liver, consistent with underlying cirrhosis. IMPRESSION: 1. Bilateral pleural effusions, similar to prior exam. 2. Bilateral lower lobe consolidation, stable from prior study. Electronically signed by: Oneil Devonshire MD 05/04/2024 12:44 AM EST RP Workstation: [id number]    Cardiac Studies   TTE 04/30/24:  IMPRESSIONS     1. Left ventricular ejection fraction, by estimation, is >75%. The left  ventricle has hyperdynamic function. The left ventricle has no regional  wall motion abnormalities. Left ventricular diastolic parameters are  indeterminate.   2. Right ventricular systolic function is normal. The right ventricular  size is normal. There is mildly elevated pulmonary artery systolic  pressure.   3. Left atrial size was severely  dilated.   4. Right atrial size was mildly dilated.   5. Moderate pleural effusion in the left lateral region.   6. The mitral valve is normal in structure. Mild to moderate mitral valve  regurgitation. No evidence  of mitral stenosis.   7. The aortic valve is calcified. Aortic valve regurgitation is not  visualized. Mild aortic valve stenosis.   8. The inferior vena cava is dilated in size with >50% respiratory  variability, suggesting right atrial pressure of 8 mmHg.   Patient Profile   Ms. Ravelo is a 83 y.o. female with a PMH of persistent AF, HFpEF, HLD, Graves' disease who was admitted for acute hypoxic respiratory failure in the setting of acute on chronic HFpEF exacerbation and atrial fibrillation with RVR.  Cardiology was consulted for the evaluation of her cardiovascular issues.  Assessment & Plan    #Acute on Chronic HFpEF Exacerbation #Acute Hypoxic Respiratory Failure - Patient admitted with volume overload in the setting of decompensated heart failure. - Diuresis has been challenging due to low blood pressures. - Ultimately the patient remains volume overloaded and does need to get additional fluid removed. - Will continue ongoing diuresis. -The patient is very net negative since admission; however, she still has a high oxygen requirement.  She also now reports having a cough.  I will do a respiratory viral screen to make sure that there is not a new issue causing her hypoxemia. Diuresis currently on hold due to hypokalemia.  Will resume diuresis once repleted Strict I's and O's, daily weights S/p thoracentesis R 12/14, s/p L thoracentesis 12/15 Consulted PT/OT to encourage ambulation  #Atrial Fibrillation with RVR - Patient found to be in atrial fibrillation with RVR on admission. - She underwent unsuccessful DCCV on 12/1 and was discharged at an outside hospital on p.o. Amio. - Currently she is tolerating IV amiodarone  well; however, she remains in A-fib with RVR. -  Plan to do TEE/DCCV today Continue oral amiodarone  load Continue Eliquis  5 mg twice daily TEE plus DCCV today Maintain K>4, Mg>2 Maintain telemetry Daily ECG for QTc monitoring  #LUE Amiodarone  Extravasation (improving) - Developed left upper extremity pain and swelling overnight with concern for amiodarone  extravasation. - I checked the patient's MAR and confirm with pharmacy that the patient did in fact receive hyaluronidase  for treatment. - She still has some left arm erythema, swelling, and tenderness, but without evidence of skin necrosis. - I instructed the patient to be vigilant regarding her arm symptoms and to notify me if things worsen. Continue oral amiodarone  S/p hyaluronidase  12/16 Supportive care with warm compresses to upper extremity  #HLD Continue atorvastatin  20 mg daily    For questions or updates, please contact [facility] Please consult [email or web address] for contact info under    Informed Consent   Shared Decision Making/Informed Consent   The risks [stroke, cardiac arrhythmias rarely resulting in the need for a temporary or permanent pacemaker, skin irritation or burns, esophageal damage, perforation (1:10,000 risk), bleeding, pharyngeal hematoma as well as other potential complications associated with conscious sedation including aspiration, arrhythmia, respiratory failure and death], benefits (treatment guidance, restoration of normal sinus rhythm, diagnostic support) and alternatives of a transesophageal echocardiogram guided cardioversion were discussed in detail with Ms. Hodgens and she is willing to proceed.       Signed, Georganna Archer, MD  05/05/2024, 10:43 AM

## 2024-05-05 NOTE — Anesthesia Preprocedure Evaluation (Addendum)
 Anesthesia Evaluation  Patient identified by MRN, date of birth, ID band Patient awake  General Assessment Comment:  Carla Petersen is a 83 y.o. female with a PMH of persistent AF, HFpEF, HLD, Graves' disease who was admitted for acute hypoxic respiratory failure in the setting of acute on chronic HFpEF exacerbation and atrial fibrillation with RVR.  Cardiology was consulted for the evaluation of her cardiovascular issues. S/p b/l thoracentesis  Reviewed: Allergy & Precautions, NPO status , Patient's Chart, lab work & pertinent test results  History of Anesthesia Complications Negative for: history of anesthetic complications  Airway Mallampati: II  TM Distance: >3 FB Neck ROM: Full    Dental no notable dental hx. (+) Teeth Intact   Pulmonary shortness of breath, sleep apnea , neg COPD, Patient abstained from smoking.Not current smoker, former smoker   Pulmonary exam normal breath sounds clear to auscultation       Cardiovascular Exercise Tolerance: Poor METS(-) hypertension+CHF  (-) CAD and (-) Past MI + dysrhythmias Atrial Fibrillation (-) pacemaker Rhythm:Irregular Rate:Tachycardia - Systolic murmurs  1. Left ventricular ejection fraction, by estimation, is >75%. The left  ventricle has hyperdynamic function. The left ventricle has no regional  wall motion abnormalities. Left ventricular diastolic parameters are  indeterminate.   2. Right ventricular systolic function is normal. The right ventricular  size is normal. There is mildly elevated pulmonary artery systolic  pressure.   3. Left atrial size was severely dilated.   4. Right atrial size was mildly dilated.   5. Moderate pleural effusion in the left lateral region.   6. The mitral valve is normal in structure. Mild to moderate mitral valve  regurgitation. No evidence of mitral stenosis.   7. The aortic valve is calcified. Aortic valve regurgitation is not  visualized. Mild  aortic valve stenosis.   8. The inferior vena cava is dilated in size with >50% respiratory  variability, suggesting right atrial pressure of 8 mmHg.     Neuro/Psych negative neurological ROS  negative psych ROS   GI/Hepatic ,neg GERD  ,,(+)     (-) substance abuse    Endo/Other  neg diabetes    Renal/GU negative Renal ROS     Musculoskeletal   Abdominal   Peds  Hematology   Anesthesia Other Findings Past Medical History: No date: CHF (congestive heart failure) (HCC) No date: Graves disease No date: Personal history of radiation therapy     Comment:  Left Breast Cancer  Reproductive/Obstetrics                              Anesthesia Physical Anesthesia Plan  ASA: 2  Anesthesia Plan: MAC   Post-op Pain Management: Minimal or no pain anticipated   Induction: Intravenous  PONV Risk Score and Plan: 2 and Propofol  infusion, TIVA and Ondansetron   Airway Management Planned: Nasal Cannula  Additional Equipment: None  Intra-op Plan:   Post-operative Plan:   Informed Consent: I have reviewed the patients History and Physical, chart, labs and discussed the procedure including the risks, benefits and alternatives for the proposed anesthesia with the patient or authorized representative who has indicated his/her understanding and acceptance.     Dental advisory given  Plan Discussed with: CRNA and Surgeon  Anesthesia Plan Comments: (Discussed risks of anesthesia with patient, including possibility of difficulty with spontaneous ventilation under anesthesia necessitating airway intervention, PONV, and rare risks such as cardiac or respiratory or neurological events, and allergic reactions.  Discussed the role of CRNA in patient's perioperative care. Patient understands.)        Anesthesia Quick Evaluation

## 2024-05-05 NOTE — Transfer of Care (Signed)
 Immediate Anesthesia Transfer of Care Note  Patient: Carla Petersen  Procedure(s) Performed: TRANSESOPHAGEAL ECHOCARDIOGRAM CARDIOVERSION  Patient Location: Cath Lab  Anesthesia Type:General  Level of Consciousness: awake, alert , and oriented  Airway & Oxygen Therapy: Patient Spontanous Breathing and Patient connected to nasal cannula oxygen  Post-op Assessment: Report given to RN and Post -op Vital signs reviewed and stable  Post vital signs: Reviewed and stable  Last Vitals:  Vitals Value Taken Time  BP 101/35 05/05/24 11:48  Temp 36 1148  Pulse 110 05/05/24 11:49  Resp 25 05/05/24 11:49  SpO2 93 % 05/05/24 11:49  Vitals shown include unfiled device data.  Last Pain:  Vitals:   05/05/24 1017  TempSrc: Tympanic  PainSc: 0-No pain         Complications: No notable events documented.

## 2024-05-05 NOTE — Plan of Care (Signed)
°  Problem: Education: Goal: Knowledge of General Education information will improve Description: Including pain rating scale, medication(s)/side effects and non-pharmacologic comfort measures Outcome: Progressing   Problem: Clinical Measurements: Goal: Will remain free from infection Outcome: Progressing   Problem: Activity: Goal: Risk for activity intolerance will decrease Outcome: Progressing   Problem: Nutrition: Goal: Adequate nutrition will be maintained Outcome: Progressing   Problem: Coping: Goal: Level of anxiety will decrease Outcome: Progressing   Problem: Elimination: Goal: Will not experience complications related to urinary retention Outcome: Progressing

## 2024-05-05 NOTE — Progress Notes (Signed)
°  Echocardiogram Echocardiogram Transesophageal has been performed.  Carla Petersen, RDCS 05/05/2024, 11:43 AM

## 2024-05-05 NOTE — Progress Notes (Signed)
 PROGRESS NOTE    Carla Petersen  FMW:[id number] DOB: [birth year] DOA: 04/29/2024 PCP: Ransom Other, MD    Chief Complaint  Patient presents with   Shortness of Breath   Leg Swelling    Brief Narrative:  Carla Petersen is a 83 y.o. female with medical history significant of CHF, Graves who presented to the emergency department due to heart palpitations -noted to be in A-fib/flutter with worsening shortness of breath orthopnea and dyspnea with exertion consistent with heart failure exacerbation.  Cardiology called in consult, hospitalist called to admit.    Assessment & Plan:   Principal Problem:   Acute exacerbation of CHF (congestive heart failure) (HCC) Active Problems:   Acute on chronic diastolic heart failure (HCC)   Acute respiratory failure with hypoxia (HCC)   Atrial fibrillation with rapid ventricular response (HCC)   Prolonged QT interval   Hypokalemia   Hypothyroidism   Hypertension   Pressure injury of skin  #1 acute on chronic diastolic heart failure, preserved EF -Patient presented with palpitation, worsening shortness of breath and dyspnea consistent with CHF exacerbation. - Patient being followed in consultation by cardiology as patient with soft/low blood pressure. - Current goal-directed medical therapy limited by patient's hypotension. - Patient status postthoracentesis on the right 05/01/2024, left thoracentesis on hold given improvement in the effusion. - Oxygen weaned to 5 L nasal cannula with goal sats of 88 to 91% at this point in time. - Repeat CT chest done with bilateral ongoing effusions. - Lights criteria not met although pleural/serum LDH ratio 0.58 with cutoff of 0.6 consistent with CHF exacerbation. - Ongoing tachycardia. - Was on IV amiodarone  however infiltrated and as such IV heparin discontinued and patient on oral amiodarone . - Repeat 2D echo 12/13 with a EF greater than 75% with hyperdynamic function, indeterminate diastolic parameters,  severely dilated left atrial size, mildly dilated right atrial size, moderate pleural effusion in the left lateral region.  Mild to moderate MVR.  No evidence of mitral stenosis.  Mild aortic valve stenosis. - Patient was on IV Lasix  and dose decreased per cardiology patient was on Lasix  40 mg IV every 12 hours. - Urine output of 2.7 L over the past 24 hours. -Patient is -6.4 L during this hospitalization. - Current weight of 169 pounds from as high as 171 pounds on 05/03/2024. -??  Midodrine to help with diuresis however will defer to cardiology. - Per cardiology.  2.  Acute hypoxic respiratory failure - Secondary to problem #1. - Chest x-ray with bilateral pleural effusions, patient on diuretics. - O2 supplementation initially 6 L nasal cannula went up as high as 10 L high flow nasal cannula being weaned down as tolerated currently on 5 L nasal cannula.  3.  Persistent A-fib -Patient was on IV amiodarone  however due to infiltration has been transition to oral amiodarone . - Patient seen in consultation by cardiology and patient underwent TEE DCCV this morning however noted per cardiology that patient converted to normal sinus rhythm for several beats before returning back to atrial fibrillation. - Eliquis  for anticoagulation. - Per cardiology.  4.  Hyperlipidemia -Continue Lipitor.  5.  Hypertension -Due to soft/borderline blood pressure metoprolol  discontinued. - Patient on IV Lasix  as noted above which has been discontinued today per cardiology.  6.  Hypothyroidism -Continue Synthroid .  7.  Hypokalemia -Secondary to diuresis. -Potassium noted at 2.9 this morning. - Patient noted to have received 40 mEq of p.o. potassium this morning and also received 4 rounds of KCl 10 mEq  IV. - Magnesium  noted at 2.0 on 05/03/2024. - Repeat labs in the AM.  8.  Pressure injury, POA Wound 04/30/24 1250 Pressure Injury Buttocks Medial;Bilateral Stage 1 -  Intact skin with non-blanchable redness  of a localized area usually over a bony prominence. (Active)       DVT prophylaxis: Eliquis  Code Status: Full Family Communication: Updated patient.  No family at bedside. Disposition: TBD  Status is: Inpatient Remains inpatient appropriate because: Severity of illness   Consultants:  Cardiology: Dr. Kriste 04/29/2024 Electrophysiology: Ozell Passey, PA 05/05/2024  Procedures:  2D echo 04/30/2024 TEE cardioversion 05/05/2024-per Dr. Emery converted to normal sinus rhythm for several beats before returning to atrial fibrillation. Right ultrasound-guided thoracentesis per Dr. Laurence 05/01/2024  Antimicrobials:  Anti-infectives (From admission, onward)    None         Subjective: Patient seen in endoscopy suite.  Alert and oriented.  Denies any chest pain or worsening shortness of breath.  Just finished TEE cardioversion which was unsuccessful.  Objective: Vitals:   05/05/24 1443 05/05/24 1445 05/05/24 1453 05/05/24 1455  BP: (!) 100/55 (!) 96/49 (!) 100/57 (!) 93/51  Pulse: (!) 122 (!) 116 (!) 120 (!) 108  Resp: 18 15 14 15   Temp:      TempSrc:      SpO2: (!) 88% (!) 87% (!) 89% 93%  Weight:      Height:        Intake/Output Summary (Last 24 hours) at 05/05/2024 1505 Last data filed at 05/05/2024 0400 Gross per 24 hour  Intake 114.71 ml  Output 1550 ml  Net -1435.29 ml   Filed Weights   05/04/24 0319 05/05/24 0500 05/05/24 1017  Weight: 77 kg 77.5 kg 76.7 kg    Examination:  General exam: Appears calm and comfortable  Respiratory system: Decreased breath sounds in the bases.  No wheezing.  Fair air movement.  Speaking in full sentences.  Cardiovascular system: Irregularly irregular.  No significant lower extremity edema. Gastrointestinal system: Abdomen is nondistended, soft and nontender. No organomegaly or masses felt. Normal bowel sounds heard. Central nervous system: Alert and oriented. No focal neurological deficits. Extremities:  Symmetric 5 x 5 power. Skin: No rashes, lesions or ulcers Psychiatry: Judgement and insight appear normal. Mood & affect appropriate.     Data Reviewed: I have personally reviewed following labs and imaging studies  CBC: Recent Labs  Lab 04/29/24 1546 04/29/24 1713 04/30/24 0342 05/03/24 1027  WBC 12.5*  --  9.6 9.1  NEUTROABS  --   --   --  7.5  HGB 13.6 14.6  14.3 12.6 13.3  HCT 42.4 43.0  42.0 39.9 41.0  MCV 96.4  --  96.1 93.4  PLT 234  --  230 219    Basic Metabolic Panel: Recent Labs  Lab 04/29/24 1957 04/30/24 0342 05/01/24 0325 05/02/24 0343 05/03/24 0509 05/05/24 0231  NA 140 141 139 138 136 139  K 3.7 4.1 3.1* 3.7 3.2* 2.9*  CL 95* 99 91* 95* 91* 91*  CO2 34* 34* 36* 38* 38* 40*  GLUCOSE 112* 112* 112* 97 112* 102*  BUN 22 18 17 16 16 15   CREATININE 1.13* 1.12* 1.14* 1.20* 1.15* 1.10*  CALCIUM  8.6* 8.5* 8.1* 8.1* 8.0* 8.6*  MG 2.6* 2.1 2.0 1.8 2.0  --     GFR: Estimated Creatinine Clearance: 36.7 mL/min (A) (by C-G formula based on SCr of 1.1 mg/dL (H)).  Liver Function Tests: Recent Labs  Lab 04/30/24 0815 05/01/24 1745  AST 21  --   ALT 17  --   ALKPHOS 128*  --   BILITOT 1.1  --   PROT 6.4* 6.7  ALBUMIN 2.5* 2.5*    CBG: No results for input(s): GLUCAP in the last 168 hours.   Recent Results (from the past 240 hours)  MRSA Next Gen by PCR, Nasal     Status: None   Collection Time: 04/30/24 12:50 PM   Specimen: Nasal Mucosa; Nasal Swab  Result Value Ref Range Status   MRSA by PCR Next Gen NOT DETECTED NOT DETECTED Final    Comment: (NOTE) The GeneXpert MRSA Assay (FDA approved for NASAL specimens only), is one component of a comprehensive MRSA colonization surveillance program. It is not intended to diagnose MRSA infection nor to guide or monitor treatment for MRSA infections. Test performance is not FDA approved in patients less than 15 years old. Performed at [facility], [address] [place], [place] [address]    Pleural fluid culture w Gram Stain     Status: None   Collection Time: 05/01/24  2:04 PM   Specimen: Pleural Fluid  Result Value Ref Range Status   Specimen Description PLEURAL  Final   Special Requests NONE  Final   Gram Stain NO WBC SEEN NO ORGANISMS SEEN   Final   Culture   Final    NO GROWTH 3 DAYS Performed at [facility], [address], [place], [place] [address]    Report Status 05/04/2024 FINAL  Final         Radiology Studies: EP STUDY Result Date: 05/05/2024 See surgical note for result.  CT CHEST WO CONTRAST Result Date: 05/04/2024 EXAM: CT CHEST WITHOUT CONTRAST 05/04/2024 12:32:18 AM TECHNIQUE: CT of the chest was performed without the administration of intravenous contrast. Multiplanar reformatted images are provided for review. Automated exposure control, iterative reconstruction, and/or weight based adjustment of the mA/kV was utilized to reduce the radiation dose to as low as reasonably achievable. COMPARISON: Chest x-ray from the previous day, CT from 04/29/2024. CLINICAL HISTORY: Clinical history is pleural effusion and shortness of breath FINDINGS: MEDIASTINUM: Heart: No cardiac enlargement is seen. Coronary calcifications are noted. Pericardium is unremarkable. The central airways are clear. Atherosclerotic calcifications of the aorta are noted without an aneurysmal dilatation. The esophagus is unremarkable. LYMPH NODES: Scattered lymph nodes are noted with normal fatty hila within the mediastinum. These are likely reactive in nature. No hilar or axillary lymphadenopathy. LUNGS AND PLEURA: Bilateral pleural effusions are noted, similar to that seen on the prior exam. Bilateral lower lobe consolidation, also stable from the prior study. 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 No sizable parenchymal nodule is noted. No pneumothorax. SOFT TISSUES/BONES: Stable compression deformity at T11. No acute abnormality of the bones or soft  tissues. UPPER ABDOMEN: Limited images of the upper abdomen demonstrate nodular changes of the liver, consistent with underlying cirrhosis. IMPRESSION: 1. Bilateral pleural effusions, similar to prior exam. 2. Bilateral lower lobe consolidation, stable from prior study. Electronically signed by: Oneil Devonshire MD 05/04/2024 12:44 AM EST RP Workstation: [id number]        Scheduled Meds:  [MAR Hold] amiodarone   200 mg Oral BID   Followed by   [FJM Hold] amiodarone   200 mg Oral Daily   [MAR Hold] apixaban   5 mg Oral BID   [MAR Hold] atorvastatin   20 mg Oral Daily   [MAR Hold] levothyroxine   25 mcg Oral Q0600   [MAR Hold]  potassium chloride   40 mEq Oral Once   Continuous Infusions:  sodium chloride  10 mL/hr at 05/04/24 1510     LOS: 6 days    Time spent: 40 minutes    Toribio Hummer, MD [facility]   To contact the attending provider between 7A-7P or the covering provider during after hours 7P-7A, please log into the web site [email or web address] and access using universal [place] password for that web site. If you do not have the password, please call the hospital operator.  05/05/2024, 3:05 PM

## 2024-05-06 DIAGNOSIS — I4891 Unspecified atrial fibrillation: Secondary | ICD-10-CM | POA: Diagnosis not present

## 2024-05-06 DIAGNOSIS — E876 Hypokalemia: Secondary | ICD-10-CM | POA: Diagnosis not present

## 2024-05-06 DIAGNOSIS — I10 Essential (primary) hypertension: Secondary | ICD-10-CM | POA: Diagnosis not present

## 2024-05-06 DIAGNOSIS — I5033 Acute on chronic diastolic (congestive) heart failure: Secondary | ICD-10-CM | POA: Diagnosis not present

## 2024-05-06 DIAGNOSIS — E039 Hypothyroidism, unspecified: Secondary | ICD-10-CM | POA: Diagnosis not present

## 2024-05-06 DIAGNOSIS — J9601 Acute respiratory failure with hypoxia: Secondary | ICD-10-CM | POA: Diagnosis not present

## 2024-05-06 LAB — BASIC METABOLIC PANEL WITH GFR
Anion gap: 7 (ref 5–15)
BUN: 17 mg/dL (ref 8–23)
CO2: 37 mmol/L — ABNORMAL HIGH (ref 22–32)
Calcium: 8.6 mg/dL — ABNORMAL LOW (ref 8.9–10.3)
Chloride: 93 mmol/L — ABNORMAL LOW (ref 98–111)
Creatinine, Ser: 1.1 mg/dL — ABNORMAL HIGH (ref 0.44–1.00)
GFR, Estimated: 49 mL/min — ABNORMAL LOW
Glucose, Bld: 93 mg/dL (ref 70–99)
Potassium: 3.2 mmol/L — ABNORMAL LOW (ref 3.5–5.1)
Sodium: 138 mmol/L (ref 135–145)

## 2024-05-06 LAB — CBC WITH DIFFERENTIAL/PLATELET
Abs Immature Granulocytes: 0.02 K/uL (ref 0.00–0.07)
Basophils Absolute: 0 K/uL (ref 0.0–0.1)
Basophils Relative: 0 %
Eosinophils Absolute: 0.2 K/uL (ref 0.0–0.5)
Eosinophils Relative: 2 %
HCT: 37.8 % (ref 36.0–46.0)
Hemoglobin: 12.4 g/dL (ref 12.0–15.0)
Immature Granulocytes: 0 %
Lymphocytes Relative: 14 %
Lymphs Abs: 1.3 K/uL (ref 0.7–4.0)
MCH: 30.9 pg (ref 26.0–34.0)
MCHC: 32.8 g/dL (ref 30.0–36.0)
MCV: 94.3 fL (ref 80.0–100.0)
Monocytes Absolute: 1 K/uL (ref 0.1–1.0)
Monocytes Relative: 11 %
Neutro Abs: 6.5 K/uL (ref 1.7–7.7)
Neutrophils Relative %: 73 %
Platelets: 231 K/uL (ref 150–400)
RBC: 4.01 MIL/uL (ref 3.87–5.11)
RDW: 14.2 % (ref 11.5–15.5)
WBC: 9 K/uL (ref 4.0–10.5)
nRBC: 0 % (ref 0.0–0.2)

## 2024-05-06 LAB — MAGNESIUM: Magnesium: 1.8 mg/dL (ref 1.7–2.4)

## 2024-05-06 MED ORDER — MAGNESIUM SULFATE 2 GM/50ML IV SOLN
2.0000 g | Freq: Once | INTRAVENOUS | Status: AC
  Administered 2024-05-06: 2 g via INTRAVENOUS
  Filled 2024-05-06: qty 50

## 2024-05-06 MED ORDER — FUROSEMIDE 10 MG/ML IJ SOLN
40.0000 mg | Freq: Two times a day (BID) | INTRAMUSCULAR | Status: DC
  Administered 2024-05-06 – 2024-05-07 (×3): 40 mg via INTRAVENOUS
  Filled 2024-05-06 (×3): qty 4

## 2024-05-06 MED ORDER — AMIODARONE HCL 200 MG PO TABS
200.0000 mg | ORAL_TABLET | Freq: Two times a day (BID) | ORAL | Status: DC
  Administered 2024-05-06 – 2024-05-07 (×3): 200 mg via ORAL
  Filled 2024-05-06 (×4): qty 1

## 2024-05-06 MED ORDER — POTASSIUM CHLORIDE CRYS ER 20 MEQ PO TBCR
40.0000 meq | EXTENDED_RELEASE_TABLET | Freq: Once | ORAL | Status: AC
  Administered 2024-05-06: 40 meq via ORAL
  Filled 2024-05-06: qty 2

## 2024-05-06 MED ORDER — POTASSIUM CHLORIDE 20 MEQ PO PACK
40.0000 meq | PACK | Freq: Once | ORAL | Status: AC
  Administered 2024-05-06: 40 meq via ORAL
  Filled 2024-05-06: qty 2

## 2024-05-06 NOTE — Progress Notes (Signed)
" °  Pt converted to NSR yesterday evening ~ 1700 and has maintained overnight.    Would continue amiodarone  200 mg BID for at least 2 weeks prior to decreasing.  EP will see as needed while remains here. She is not a candidate for EP procedures at this time so OK to follow up with gen cards and see EP as needed.    Ozell Jodie Passey, PA-C  05/06/2024 7:19 AM  "

## 2024-05-06 NOTE — Progress Notes (Signed)
 " PROGRESS NOTE    Carla Petersen  FMW:[id number] DOB: October 29, 1940 DOA: 04/29/2024 PCP: Ransom Other, MD    Chief Complaint  Patient presents with   Shortness of Breath   Leg Swelling    Brief Narrative:  Carla Petersen is a 83 y.o. female with medical history significant of CHF, Graves who presented to the emergency department due to heart palpitations -noted to be in A-fib/flutter with worsening shortness of breath orthopnea and dyspnea with exertion consistent with heart failure exacerbation.  Cardiology called in consult, hospitalist called to admit.    Assessment & Plan:   Principal Problem:   Acute exacerbation of CHF (congestive heart failure) (HCC) Active Problems:   Acute on chronic diastolic heart failure (HCC)   Acute respiratory failure with hypoxia (HCC)   Atrial fibrillation with rapid ventricular response (HCC)   Prolonged QT interval   Hypokalemia   Hypothyroidism   Hypertension   Pressure injury of skin   Persistent atrial fibrillation (HCC)  #1 acute on chronic diastolic heart failure, preserved EF -Patient presented with palpitation, worsening shortness of breath and dyspnea consistent with CHF exacerbation. - Patient being followed in consultation by cardiology as patient with soft/low blood pressure. - Current goal-directed medical therapy limited by patient's hypotension. - Patient status postthoracentesis on the right 05/01/2024, left thoracentesis on hold given improvement in the effusion. - Oxygen weaned to 4 L nasal cannula with goal sats of 88 to 91% at this point in time. - Repeat CT chest done with bilateral ongoing effusions. - Lights criteria not met although pleural/serum LDH ratio 0.58 with cutoff of 0.6 consistent with CHF exacerbation. - Ongoing tachycardia. - Was on IV amiodarone  however infiltrated and as such IV heparin discontinued and patient on oral amiodarone . - Repeat 2D echo 12/13 with a EF greater than 75% with hyperdynamic  function, indeterminate diastolic parameters, severely dilated left atrial size, mildly dilated right atrial size, moderate pleural effusion in the left lateral region.  Mild to moderate MVR.  No evidence of mitral stenosis.  Mild aortic valve stenosis. - Patient was on IV Lasix  and dose decreased per cardiology patient was on Lasix  40 mg IV every 12 hours. -Urine output of 800 cc recorded over the past 24 hours. -Patient is -7.1 L during this hospitalization. - Current weight of 164.3 pounds from 169 pounds from as high as 171 pounds on 05/03/2024. -??  Midodrine  to help with diuresis however will defer to cardiology. - Per cardiology.  2.  Acute hypoxic respiratory failure - Secondary to problem #1. - Chest x-ray with bilateral pleural effusions, patient on diuretics. - O2 supplementation initially 6 L nasal cannula went up as high as 10 L high flow nasal cannula being weaned down as tolerated currently on 4 L nasal cannula with sats of 91%. - Patient resumed back on IV Lasix  per cardiology today..  3.  Persistent A-fib -Patient was on IV amiodarone  however due to infiltration has been transitioned to oral amiodarone . - Patient seen in consultation by cardiology and patient underwent TEE DCCV (05/05/2024) however noted per cardiology that patient converted to normal sinus rhythm for several beats before returning back to atrial fibrillation. -Patient noted to be going in and out of A-fib and currently in A-fib this morning. - Eliquis  for anticoagulation. - Per cardiology.  4.  Hyperlipidemia - Lipitor.   5.  Hypertension -Due to soft/borderline blood pressure metoprolol  discontinued. - Patient on IV Lasix  as noted above which was discontinued per cardiology on 05/05/2024, IV  Lasix  at 40 mg every 12 hours resumed today per cardiology.  6.  Hypothyroidism - Synthroid .   7.  Hypokalemia -Secondary to diuresis. -Potassium noted at 3.2 this morning. - Potassium supplementation ordered  by cardiology this morning.  -Magnesium  at 1.8  - IV magnesium  ordered.   - Repeat labs in the a.m.   8.  Pressure injury, POA Wound 04/30/24 1250 Pressure Injury Buttocks Medial;Bilateral Stage 1 -  Intact skin with non-blanchable redness of a localized area usually over a bony prominence. (Active)       DVT prophylaxis: Eliquis  Code Status: Full Family Communication: Updated patient.  No family at bedside. Disposition: TBD  Status is: Inpatient Remains inpatient appropriate because: Severity of illness   Consultants:  Cardiology: Dr. Kriste 04/29/2024 Electrophysiology: Ozell Passey, PA 05/05/2024  Procedures:  2D echo 04/30/2024 TEE cardioversion 05/05/2024-per Dr. Emery converted to normal sinus rhythm for several beats before returning to atrial fibrillation. Right ultrasound-guided thoracentesis per Dr. Laurence 05/01/2024  Antimicrobials:  Anti-infectives (From admission, onward)    None         Subjective: Patient sitting up in bed.  Denies any chest pain.  No significant change with shortness of breath.  Denies any significant palpitations.  He stated he got up to work with physical therapy this morning and heart rate shot up into the 130s and was glad to be able to get back in the bed.  Patient noted to have converted to normal sinus rhythm yesterday but currently back in A-fib with heart rates ranging from 105 to the 130s.  Daughter, husband, grandsons at bedside.    Objective: Vitals:   05/06/24 0025 05/06/24 0026 05/06/24 0535 05/06/24 0807  BP: (!) 89/48 (!) 100/49 (!) 97/44 128/68  Pulse: 82 81 74 100  Resp: (!) 25 (!) 22 16 19   Temp: 97.9 F (36.6 C) 97.9 F (36.6 C) 97.7 F (36.5 C) 97.8 F (36.6 C)  TempSrc: Oral Oral Oral Oral  SpO2: 93% 94% 95% 91%  Weight:   74.5 kg   Height:        Intake/Output Summary (Last 24 hours) at 05/06/2024 1126 Last data filed at 05/06/2024 0025 Gross per 24 hour  Intake 120 ml  Output 800 ml   Net -680 ml   Filed Weights   05/05/24 0500 05/05/24 1017 05/06/24 0535  Weight: 77.5 kg 76.7 kg 74.5 kg    Examination:  General exam: NAD Respiratory system: Bibasilar crackles.  No wheezing.  Fair air movement.  Speaking in full sentences. Cardiovascular system: Irregularly irregular.  No significant pitting lower extremity edema.   Gastrointestinal system: Abdomen is soft, nontender, nondistended, positive bowel sounds.  No rebound.  No guarding.  Central nervous system: Alert and oriented. No focal neurological deficits. Extremities: Symmetric 5 x 5 power. Skin: No rashes, lesions or ulcers Psychiatry: Judgement and insight appear normal. Mood & affect appropriate.     Data Reviewed: I have personally reviewed following labs and imaging studies  CBC: Recent Labs  Lab 04/29/24 1546 04/29/24 1713 04/30/24 0342 05/03/24 1027 05/06/24 0314  WBC 12.5*  --  9.6 9.1 9.0  NEUTROABS  --   --   --  7.5 6.5  HGB 13.6 14.6  14.3 12.6 13.3 12.4  HCT 42.4 43.0  42.0 39.9 41.0 37.8  MCV 96.4  --  96.1 93.4 94.3  PLT 234  --  230 219 231    Basic Metabolic Panel: Recent Labs  Lab 05/01/24 0325 05/02/24 0343 05/03/24  9490 05/05/24 0231 05/05/24 1933 05/06/24 0314  NA 139 138 136 139 139 138  K 3.1* 3.7 3.2* 2.9* 3.8 3.2*  CL 91* 95* 91* 91* 93* 93*  CO2 36* 38* 38* 40* 38* 37*  GLUCOSE 112* 97 112* 102* 188* 93  BUN 17 16 16 15 16 17   CREATININE 1.14* 1.20* 1.15* 1.10* 1.18* 1.10*  CALCIUM  8.1* 8.1* 8.0* 8.6* 8.9 8.6*  MG 2.0 1.8 2.0  --  1.9 1.8    GFR: Estimated Creatinine Clearance: 36.1 mL/min (A) (by C-G formula based on SCr of 1.1 mg/dL (H)).  Liver Function Tests: Recent Labs  Lab 04/30/24 0815 05/01/24 1745  AST 21  --   ALT 17  --   ALKPHOS 128*  --   BILITOT 1.1  --   PROT 6.4* 6.7  ALBUMIN 2.5* 2.5*    CBG: No results for input(s): GLUCAP in the last 168 hours.   Recent Results (from the past 240 hours)  MRSA Next Gen by PCR, Nasal      Status: None   Collection Time: 04/30/24 12:50 PM   Specimen: Nasal Mucosa; Nasal Swab  Result Value Ref Range Status   MRSA by PCR Next Gen NOT DETECTED NOT DETECTED Final    Comment: (NOTE) The GeneXpert MRSA Assay (FDA approved for NASAL specimens only), is one component of a comprehensive MRSA colonization surveillance program. It is not intended to diagnose MRSA infection nor to guide or monitor treatment for MRSA infections. Test performance is not FDA approved in patients less than 52 years old. Performed at [facility], [address], [place], [place] [address]   Pleural fluid culture w Gram Stain     Status: None   Collection Time: 05/01/24  2:04 PM   Specimen: Pleural Fluid  Result Value Ref Range Status   Specimen Description PLEURAL  Final   Special Requests NONE  Final   Gram Stain NO WBC SEEN NO ORGANISMS SEEN   Final   Culture   Final    NO GROWTH 3 DAYS Performed at [facility], [address], [place], [place] [address]    Report Status 05/04/2024 FINAL  Final  Resp panel by RT-PCR (RSV, Flu A&B, Covid) Anterior Nasal Swab     Status: None   Collection Time: 05/05/24  6:43 PM   Specimen: Anterior Nasal Swab  Result Value Ref Range Status   SARS Coronavirus 2 by RT PCR NEGATIVE NEGATIVE Final   Influenza A by PCR NEGATIVE NEGATIVE Final   Influenza B by PCR NEGATIVE NEGATIVE Final    Comment: (NOTE) The Xpert Xpress SARS-CoV-2/FLU/RSV plus assay is intended as an aid in the diagnosis of influenza from Nasopharyngeal swab specimens and should not be used as a sole basis for treatment. Nasal washings and aspirates are unacceptable for Xpert Xpress SARS-CoV-2/FLU/RSV testing.  Fact Sheet for Patients: bloggercourse.com  Fact Sheet for Healthcare Providers: seriousbroker.it  This test is not yet approved or cleared by the [place]  FDA and has been authorized for detection and/or  diagnosis of SARS-CoV-2 by FDA under an Emergency Use Authorization (EUA). This EUA will remain in effect (meaning this test can be used) for the duration of the COVID-19 declaration under Section 564(b)(1) of the Act, 21 U.S.C. section 360bbb-3(b)(1), unless the authorization is terminated or revoked.     Resp Syncytial Virus by PCR NEGATIVE NEGATIVE Final    Comment: (NOTE) Fact Sheet for Patients: bloggercourse.com  Fact Sheet for Healthcare  Providers: seriousbroker.it  This test is not yet approved or cleared by the [place]  FDA and has been authorized for detection and/or diagnosis of SARS-CoV-2 by FDA under an Emergency Use Authorization (EUA). This EUA will remain in effect (meaning this test can be used) for the duration of the COVID-19 declaration under Section 564(b)(1) of the Act, 21 U.S.C. section 360bbb-3(b)(1), unless the authorization is terminated or revoked.  Performed at [facility], [address], [place], [place] [address]          Radiology Studies: ECHO TEE Result Date: 05/05/2024    TRANSESOPHOGEAL ECHO REPORT   Patient Name:   Lerin Trice Date of Exam: 05/05/2024 Medical Rec #:  [id number]      Height:       63.0 in Accession #:    [id number]     Weight:       169.0 lb Date of Birth:  07-Mar-1941       BSA:          1.800 m Patient Age:    83 years       BP:           96/52 mmHg Patient Gender: F              HR:           112 bpm. Exam Location:  Inpatient Procedure: Transesophageal Echo, 3D Echo, Color Doppler and Cardiac Doppler            (Both Spectral and Color Flow Doppler were utilized during            procedure). Indications:     Atrial Fibrillation  History:         Patient has prior history of Echocardiogram examinations, most                  recent 04/30/2024. CHF, Arrythmias:Atrial Fibrillation; Risk                  Factors:Sleep Apnea.  Sonographer:     Koleen Popper RDCS  Referring Phys:  [id number] [facility] SANTO Diagnosing Phys: Stanly Santo MD PROCEDURE: After discussion of the risks and benefits of a TEE, an informed consent was obtained from the patient. The transesophogeal probe was passed without difficulty through the esophogus of the patient. Imaged were obtained with the patient in a left lateral decubitus position. Sedation performed by different physician. The patient was monitored while under deep sedation. Anesthestetic sedation was provided intravenously by Anesthesiology: 173mg  of Propofol . Image quality was good. The patient developed no complications during the procedure. An unsuccessful direct current cardioversion was performed. 1st attempt at 200J, 2nd attempt at 250J, and 3rd attempt at 300J.  IMPRESSIONS  1. Left ventricular ejection fraction, by estimation, is 60 to 65%. The left ventricle has normal function.  2. Right ventricular systolic function is normal. The right ventricular size is normal.  3. Left atrial size was severely dilated. No left atrial/left atrial appendage thrombus was detected.  4. Right atrial size was severely dilated.  5. Moderate pleural effusion in the left lateral region.  6. The mitral valve is normal in structure. Mild to moderate mitral valve regurgitation. No evidence of mitral stenosis.  7. Functional bicuspid . 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 Aortic valve regurgitation is not visualized. No aortic stenosis is present.  8. 3D performed of the LAA and demonstrates Maximal landing zone 17 mm with wall distance 17 mm. FINDINGS  Left Ventricle:  Left ventricular ejection fraction, by estimation, is 60 to 65%. The left ventricle has normal function. The left ventricular internal cavity size was normal in size. Right Ventricle: The right ventricular size is normal. No increase in right ventricular wall thickness. Right ventricular systolic function is normal. Left Atrium: Left atrial size was severely dilated. No left  atrial/left atrial appendage thrombus was detected. Right Atrium: Right atrial size was severely dilated. Pericardium: There is no evidence of pericardial effusion. Mitral Valve: The mitral valve is normal in structure. Mild to moderate mitral valve regurgitation. No evidence of mitral valve stenosis. Tricuspid Valve: The tricuspid valve is normal in structure. Tricuspid valve regurgitation is mild . No evidence of tricuspid stenosis. Aortic Valve: Functional bicuspid. 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 Aortic valve regurgitation is not visualized. No aortic stenosis is present. Pulmonic Valve: The pulmonic valve was not well visualized. Pulmonic valve regurgitation is not visualized. No evidence of pulmonic stenosis. Aorta: The aortic root and ascending aorta are structurally normal, with no evidence of dilitation. IAS/Shunts: No atrial level shunt detected by color flow Doppler. Additional Comments: There is a moderate pleural effusion in the left lateral region. Spectral Doppler performed. MR Peak grad: 102.8 mmHg  TRICUSPID VALVE MR Vmax:      507.00 cm/s TR Peak grad:   28.7 mmHg                           TR Vmax:        268.00 cm/s Stanly Leavens MD Electronically signed by Stanly Leavens MD Signature Date/Time: 05/05/2024/4:54:12 PM    Final    EP STUDY Result Date: 05/05/2024 See surgical note for result.       Scheduled Meds:  amiodarone   200 mg Oral BID   apixaban   5 mg Oral BID   atorvastatin   20 mg Oral Daily   furosemide   40 mg Intravenous BID   levothyroxine   25 mcg Oral Q0600   potassium chloride   40 mEq Oral Once   Continuous Infusions:     LOS: 7 days    Time spent: 40 minutes    Toribio Hummer, MD [facility]   To contact the attending provider between 7A-7P or the covering provider during after hours 7P-7A, please log into the web site [email or web address] and access using universal [place] password for that web site. If you do not have the password,  please call the hospital operator.  05/06/2024, 11:26 AM    "

## 2024-05-06 NOTE — Evaluation (Signed)
 Occupational Therapy Evaluation Patient Details Name: Carla Petersen MRN: [id number] DOB: Apr 13, 1941 Today's Date: 05/06/2024   History of Present Illness   83 y.o. female [facility] to [facility] 04/29/24 with heart palpitations. Pt in a-fib/flutter with acute exacerbation of CHF and acute hypoxic respiratory failure. Chest CT showed B effusions. 12/14 s/p L thoracentesis. 12/18 s/p TEE and cardioversion. PMHx: CHF, Graves, HTN     Clinical Impressions Patient admitted for the diagnosis above.  PTA she lives at home, and remains independent with ADL,iADL and mobility.  Deficits impacting independence are listed below.  Currently she is needing supervision for ADL completion at a sit to stand level and for in room mobility/toileting.  OT will follow in the acute setting, and no post acute OT is anticipated.       If plan is discharge home, recommend the following:   A little help with bathing/dressing/bathroom;A little help with walking and/or transfers     Functional Status Assessment   Patient has had a recent decline in their functional status and demonstrates the ability to make significant improvements in function in a reasonable and predictable amount of time.     Equipment Recommendations   None recommended by OT     Recommendations for Other Services         Precautions/Restrictions   Precautions Precautions: Fall Recall of Precautions/Restrictions: Intact Precaution/Restrictions Comments: watch HR/O2 Restrictions Weight Bearing Restrictions Per Provider Order: No     Mobility Bed Mobility Overal bed mobility: Modified Independent               Patient Response: Cooperative  Transfers Overall transfer level: Needs assistance Equipment used: Rolling walker (2 wheels) Transfers: Sit to/from Stand Sit to Stand: Supervision                  Balance Overall balance assessment: Needs assistance Sitting-balance support: No upper extremity  supported, Feet supported Sitting balance-Leahy Scale: Good     Standing balance support: Reliant on assistive device for balance Standing balance-Leahy Scale: Poor                             ADL either performed or assessed with clinical judgement   ADL       Grooming: Wash/dry hands;Wash/dry face;Oral care;Supervision/safety;Standing;Sitting               Lower Body Dressing: Supervision/safety;Sit to/from stand   Toilet Transfer: Supervision/safety;Rolling walker (2 wheels);Regular Toilet;BSC/3in1                   Vision Patient Visual Report: No change from baseline       Perception Perception: Not tested       Praxis Praxis: Not tested       Pertinent Vitals/Pain Pain Assessment Pain Assessment: No/denies pain     Extremity/Trunk Assessment Upper Extremity Assessment Upper Extremity Assessment: Overall WFL for tasks assessed   Lower Extremity Assessment Lower Extremity Assessment: Defer to PT evaluation   Cervical / Trunk Assessment Cervical / Trunk Assessment: Normal   Communication Communication Communication: No apparent difficulties Factors Affecting Communication: Hearing impaired   Cognition Arousal: Alert Behavior During Therapy: WFL for tasks assessed/performed Cognition: No apparent impairments                               Following commands: Intact       Cueing  General Comments  Cueing Techniques: Verbal cues      Exercises     Shoulder Instructions      Home Living Family/patient expects to be discharged to:: Private residence Living Arrangements: Spouse/significant other Available Help at Discharge: Family;Available 24 hours/day Type of Home: House Home Access: Ramped entrance     Home Layout: One level     Bathroom Shower/Tub: Chief Strategy Officer: Handicapped height     Home Equipment: Cane - single point;Rollator (4 wheels)   Additional Comments: Goes in  through the ramp to the back door      Prior Functioning/Environment Prior Level of Function : Independent/Modified Independent             Mobility Comments: Ind with no AD in the home, SP cane in the community. Denies falls ADLs Comments: Ind, will use motorized cart in grocery store    OT Problem List: Decreased activity tolerance;Impaired balance (sitting and/or standing)   OT Treatment/Interventions: Self-care/ADL training;Therapeutic activities;Patient/family education;Balance training;DME and/or AE instruction      OT Goals(Current goals can be found in the care plan section)   Acute Rehab OT Goals Patient Stated Goal: Return home OT Goal Formulation: With patient Time For Goal Achievement: 05/20/24 Potential to Achieve Goals: Good ADL Goals Pt Will Perform Grooming: with modified independence;standing Pt Will Perform Lower Body Dressing: with modified independence;sit to/from stand Pt Will Transfer to Toilet: with modified independence;ambulating;regular height toilet   OT Frequency:  Min 2X/week    Co-evaluation              AM-PAC OT 6 Clicks Daily Activity     Outcome Measure Help from another person eating meals?: None Help from another person taking care of personal grooming?: A Little Help from another person toileting, which includes using toliet, bedpan, or urinal?: A Little Help from another person bathing (including washing, rinsing, drying)?: A Little Help from another person to put on and taking off regular upper body clothing?: None Help from another person to put on and taking off regular lower body clothing?: A Little 6 Click Score: 20   End of Session Equipment Utilized During Treatment: Rolling walker (2 wheels);Oxygen Nurse Communication: Mobility status  Activity Tolerance: Patient tolerated treatment well Patient left: in bed;with call bell/phone within reach  OT Visit Diagnosis: Unsteadiness on feet (R26.81)                 Time: 8996-8974 OT Time Calculation (min): 22 min Charges:  OT General Charges $OT Visit: 1 Visit OT Evaluation $OT Eval Moderate Complexity: 1 Mod  05/06/2024  RP, OTR/L  Acute Rehabilitation Services  Office:  [phone]   Carla Petersen 05/06/2024, 10:30 AM

## 2024-05-06 NOTE — Progress Notes (Signed)
 "  Rounding Note   Patient Name: Carla Petersen Date of Encounter: 05/06/2024  [facility] Cardiologist: Lonni LITTIE Nanas, MD   Subjective - Patient underwent DCCV yesterday that was unsuccessful. - The patient later on converted to NSR on her own. - Today she has no complaints feels like her breathing is improving  Scheduled Meds:  amiodarone   200 mg Oral BID   apixaban   5 mg Oral BID   atorvastatin   20 mg Oral Daily   levothyroxine   25 mcg Oral Q0600   potassium chloride   40 mEq Oral Once   potassium chloride   40 mEq Oral Once   Continuous Infusions:  magnesium  sulfate bolus IVPB      PRN Meds: acetaminophen  **OR** acetaminophen , ondansetron  **OR** ondansetron  (ZOFRAN ) IV, mouth rinse   Vital Signs  Vitals:   05/06/24 0025 05/06/24 0026 05/06/24 0535 05/06/24 0807  BP: (!) 89/48 (!) 100/49 (!) 97/44 128/68  Pulse: 82 81 74 100  Resp: (!) 25 (!) 22 16 19   Temp: 97.9 F (36.6 C) 97.9 F (36.6 C) 97.7 F (36.5 C) 97.8 F (36.6 C)  TempSrc: Oral Oral Oral Oral  SpO2: 93% 94% 95% 91%  Weight:   74.5 kg   Height:        Intake/Output Summary (Last 24 hours) at 05/06/2024 0903 Last data filed at 05/06/2024 0025 Gross per 24 hour  Intake 120 ml  Output 800 ml  Net -680 ml      05/06/2024    5:35 AM 05/05/2024   10:17 AM 05/05/2024    5:00 AM  Last 3 Weights  Weight (lbs) 164 lb 4.8 oz 169 lb 170 lb 13.7 oz  Weight (kg) 74.526 kg 76.658 kg 77.5 kg      Telemetry Atrial fibrillation with RVR- Personally Reviewed  ECG  No new ECG  Physical Exam  GEN: No acute distress.   Neck: no JVD Cardiac: RRR, II/VI systolic murmur, no rubs, or gallops.  Respiratory: Bibasilar rales, no wheezes or rhonchi GI: Soft, nontender, non-distended  MS: Trace edema bilaterally; no deformity Neuro:  Nonfocal  Psych: Normal affect   Labs High Sensitivity Troponin:   Recent Labs  Lab 04/29/24 1624 04/29/24 1957  TROPONINIHS 7 8   No results for  input(s): TRNPT in the last 720 hours.     Chemistry Recent Labs  Lab 04/30/24 0815 05/01/24 0325 05/01/24 1745 05/02/24 0343 05/03/24 0509 05/05/24 0231 05/05/24 1933 05/06/24 0314  NA  --    < >  --    < > 136 139 139 138  K  --    < >  --    < > 3.2* 2.9* 3.8 3.2*  CL  --    < >  --    < > 91* 91* 93* 93*  CO2  --    < >  --    < > 38* 40* 38* 37*  GLUCOSE  --    < >  --    < > 112* 102* 188* 93  BUN  --    < >  --    < > 16 15 16 17   CREATININE  --    < >  --    < > 1.15* 1.10* 1.18* 1.10*  CALCIUM   --    < >  --    < > 8.0* 8.6* 8.9 8.6*  MG  --    < >  --    < > 2.0  --  1.9 1.8  PROT 6.4*  --  6.7  --   --   --   --   --   ALBUMIN 2.5*  --  2.5*  --   --   --   --   --   AST 21  --   --   --   --   --   --   --   ALT 17  --   --   --   --   --   --   --   ALKPHOS 128*  --   --   --   --   --   --   --   BILITOT 1.1  --   --   --   --   --   --   --   GFRNONAA  --    < >  --    < > 47* 49* 45* 49*  ANIONGAP  --    < >  --    < > 7 8 8 7    < > = values in this interval not displayed.    Lipids No results for input(s): CHOL, TRIG, HDL, LABVLDL, LDLCALC, CHOLHDL in the last 168 hours.  Hematology Recent Labs  Lab 04/30/24 0342 05/03/24 1027 05/06/24 0314  WBC 9.6 9.1 9.0  RBC 4.15 4.39 4.01  HGB 12.6 13.3 12.4  HCT 39.9 41.0 37.8  MCV 96.1 93.4 94.3  MCH 30.4 30.3 30.9  MCHC 31.6 32.4 32.8  RDW 14.6 14.4 14.2  PLT 230 219 231   Thyroid  No results for input(s): TSH, FREET4 in the last 168 hours.  BNP Recent Labs  Lab 04/29/24 1546 05/01/24 0325  BNP 584.1* 396.0*    DDimer No results for input(s): DDIMER in the last 168 hours.   Radiology  ECHO TEE Result Date: 05/05/2024    TRANSESOPHOGEAL ECHO REPORT   Patient Name:   KATHLEN SAKURAI Date of Exam: 05/05/2024 Medical Rec #:  [id number]      Height:       63.0 in Accession #:    [id number]     Weight:       169.0 lb Date of Birth:  Jun 14, 1940       BSA:          1.800 m Patient Age:     83 years       BP:           96/52 mmHg Patient Gender: F              HR:           112 bpm. Exam Location:  Inpatient Procedure: Transesophageal Echo, 3D Echo, Color Doppler and Cardiac Doppler            (Both Spectral and Color Flow Doppler were utilized during            procedure). Indications:     Atrial Fibrillation  History:         Patient has prior history of Echocardiogram examinations, most                  recent 04/30/2024. CHF, Arrythmias:Atrial Fibrillation; Risk                  Factors:Sleep Apnea.  Sonographer:     Koleen Popper RDCS Referring Phys:  [id number] [facility] A SANTO Diagnosing Phys: Stanly Santo MD PROCEDURE: After discussion of the risks and benefits of a TEE,  an informed consent was obtained from the patient. The transesophogeal probe was passed without difficulty through the esophogus of the patient. Imaged were obtained with the patient in a left lateral decubitus position. Sedation performed by different physician. The patient was monitored while under deep sedation. Anesthestetic sedation was provided intravenously by Anesthesiology: 173mg  of Propofol . Image quality was good. The patient developed no complications during the procedure. An unsuccessful direct current cardioversion was performed. 1st attempt at 200J, 2nd attempt at 250J, and 3rd attempt at 300J.  IMPRESSIONS  1. Left ventricular ejection fraction, by estimation, is 60 to 65%. The left ventricle has normal function.  2. Right ventricular systolic function is normal. The right ventricular size is normal.  3. Left atrial size was severely dilated. No left atrial/left atrial appendage thrombus was detected.  4. Right atrial size was severely dilated.  5. Moderate pleural effusion in the left lateral region.  6. The mitral valve is normal in structure. Mild to moderate mitral valve regurgitation. No evidence of mitral stenosis.  7. Functional bicuspid . 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 Aortic valve  regurgitation is not visualized. No aortic stenosis is present.  8. 3D performed of the LAA and demonstrates Maximal landing zone 17 mm with wall distance 17 mm. FINDINGS  Left Ventricle: Left ventricular ejection fraction, by estimation, is 60 to 65%. The left ventricle has normal function. The left ventricular internal cavity size was normal in size. Right Ventricle: The right ventricular size is normal. No increase in right ventricular wall thickness. Right ventricular systolic function is normal. Left Atrium: Left atrial size was severely dilated. No left atrial/left atrial appendage thrombus was detected. Right Atrium: Right atrial size was severely dilated. Pericardium: There is no evidence of pericardial effusion. Mitral Valve: The mitral valve is normal in structure. Mild to moderate mitral valve regurgitation. No evidence of mitral valve stenosis. Tricuspid Valve: The tricuspid valve is normal in structure. Tricuspid valve regurgitation is mild . No evidence of tricuspid stenosis. Aortic Valve: Functional bicuspid. 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 Aortic valve regurgitation is not visualized. No aortic stenosis is present. Pulmonic Valve: The pulmonic valve was not well visualized. Pulmonic valve regurgitation is not visualized. No evidence of pulmonic stenosis. Aorta: The aortic root and ascending aorta are structurally normal, with no evidence of dilitation. IAS/Shunts: No atrial level shunt detected by color flow Doppler. Additional Comments: There is a moderate pleural effusion in the left lateral region. Spectral Doppler performed. MR Peak grad: 102.8 mmHg  TRICUSPID VALVE MR Vmax:      507.00 cm/s TR Peak grad:   28.7 mmHg                           TR Vmax:        268.00 cm/s Stanly Leavens MD Electronically signed by Stanly Leavens MD Signature Date/Time: 05/05/2024/4:54:12 PM    Final    EP STUDY Result Date: 05/05/2024 See surgical note for result.   Cardiac Studies   TTE  04/30/24:  IMPRESSIONS     1. Left ventricular ejection fraction, by estimation, is >75%. The left  ventricle has hyperdynamic function. The left ventricle has no regional  wall motion abnormalities. Left ventricular diastolic parameters are  indeterminate.   2. Right ventricular systolic function is normal. The right ventricular  size is normal. There is mildly elevated pulmonary artery systolic  pressure.   3. Left atrial size was severely dilated.   4. Right atrial  size was mildly dilated.   5. Moderate pleural effusion in the left lateral region.   6. The mitral valve is normal in structure. Mild to moderate mitral valve  regurgitation. No evidence of mitral stenosis.   7. The aortic valve is calcified. Aortic valve regurgitation is not  visualized. Mild aortic valve stenosis.   8. The inferior vena cava is dilated in size with >50% respiratory  variability, suggesting right atrial pressure of 8 mmHg.   Patient Profile   Ms. Rambeau is a 83 y.o. female with a PMH of persistent AF, HFpEF, HLD, Graves' disease who was admitted for acute hypoxic respiratory failure in the setting of acute on chronic HFpEF exacerbation and atrial fibrillation with RVR.  Cardiology was consulted for the evaluation of her cardiovascular issues.  Assessment & Plan    #Acute on Chronic HFpEF Exacerbation #Acute Hypoxic Respiratory Failure - Patient admitted with volume overload in the setting of decompensated heart failure. - Diuresis has been challenging due to low blood pressures. - Ultimately the patient remains volume overloaded and does need to get additional fluid removed. - Respiratory viral panel negative -Despite aggressive diuresis she still appears volume overloaded so we will keep diuresing her at this time. Continue Lasix  40 mg IV twice daily Strict I's and O's, daily weights S/p thoracentesis R 12/14, s/p L thoracentesis 12/15 Follow-up PT OT Recs  #Atrial Fibrillation with RVR -  Patient found to be in atrial fibrillation with RVR on admission. - She underwent unsuccessful DCCV on 12/1 and was discharged at an outside hospital on p.o. Amio. - Currently she is tolerating IV amiodarone  well; however, she remains in A-fib with RVR. - Repeat TEE/DCCV on 12/18 was unsuccessful; however, the patient later converted to NSR on her own. -EP consulted and agreed with continuing amiodarone  without pursuing advanced treatments at this time. Continue oral amiodarone  load Continue Eliquis  5 mg twice daily Maintain K>4, Mg>2 Maintain telemetry Daily ECG for QTc monitoring  #LUE Amiodarone  Extravasation (improving) #Superficial Thrombophlebitis (improving) - Developed left upper extremity pain and swelling overnight with concern for amiodarone  extravasation. - I checked the patient's MAR and confirm with pharmacy that the patient did in fact receive hyaluronidase  for treatment. - She still has some left arm erythema, swelling, and tenderness, but without evidence of skin necrosis. - I instructed the patient to be vigilant regarding her arm symptoms and to notify me if things worsen. Continue oral amiodarone  S/p hyaluronidase  12/16 Supportive care with warm compresses to upper extremity  #HLD Continue atorvastatin  20 mg daily    For questions or updates, please contact [facility] Please consult [email or web address] for contact info under    Informed Consent   Shared Decision Making/Informed Consent   The risks [stroke, cardiac arrhythmias rarely resulting in the need for a temporary or permanent pacemaker, skin irritation or burns, esophageal damage, perforation (1:10,000 risk), bleeding, pharyngeal hematoma as well as other potential complications associated with conscious sedation including aspiration, arrhythmia, respiratory failure and death], benefits (treatment guidance, restoration of normal sinus rhythm, diagnostic support) and alternatives of a transesophageal  echocardiogram guided cardioversion were discussed in detail with Ms. Helman and she is willing to proceed.       Signed, Georganna Archer, MD  05/06/2024, 9:03 AM    "

## 2024-05-06 NOTE — Evaluation (Signed)
 Physical Therapy Evaluation Patient Details Name: Carla Petersen MRN: [id number] DOB: 1940/05/25 Today's Date: 05/06/2024  History of Present Illness  83 y.o. female presents to [facility] 04/29/24 with heart palpitations. Pt in a-fib/flutter with acute exacerbation of CHF and acute hypoxic respiratory failure. Chest CT showed B effusions. 12/14 s/p L thoracentesis. 12/18 s/p TEE and cardioversion. PMHx: CHF, Graves, HTN   Clinical Impression  Pt lives at home with her husband in a 1 story house with a ramp to enter. Pt would ambulate with no AD in the home and SP cane in the community. Pt only able to ambulate short distances with need to take frequent seated rest breaks. Pt presents close to mobility baseline being ModI for bed mobility and supervision to stand and ambulate 106ft with RW. Limited gait distance due to tachycardia, vitals below. Discussed using rollator to allow for seated rest breaks with pt preferring use of SP cane. Recommending home with cardiac rehab follow-up. Acute PT to follow to address functional deficits.  120 BPM at Rest 120-144 BPM during gait 92% SpO2 on 3L at rest 89-91% SpO2 on 3L during gait        If plan is discharge home, recommend the following: Assist for transportation;Help with stairs or ramp for entrance   Can travel by private vehicle    Yes    Equipment Recommendations None recommended by PT     Functional Status Assessment Patient has had a recent decline in their functional status and demonstrates the ability to make significant improvements in function in a reasonable and predictable amount of time.     Precautions / Restrictions Precautions Precautions: Fall Recall of Precautions/Restrictions: Intact Precaution/Restrictions Comments: watch HR/O2 Restrictions Weight Bearing Restrictions Per Provider Order: No      Mobility  Bed Mobility Overal bed mobility: Modified Independent     Transfers Overall transfer level: Needs  assistance Equipment used: Rolling walker (2 wheels) Transfers: Sit to/from Stand Sit to Stand: Supervision      General transfer comment: for safety    Ambulation/Gait Ambulation/Gait assistance: Supervision Gait Distance (Feet): 20 Feet Assistive device: Rolling walker (2 wheels) Gait Pattern/deviations: Step-through pattern, Decreased stride length Gait velocity: decr     General Gait Details: steady gait with no overt LOB. Distance limited by elevated HR        Balance Overall balance assessment: Needs assistance Sitting-balance support: No upper extremity supported, Feet supported Sitting balance-Leahy Scale: Good     Standing balance support: Bilateral upper extremity supported, During functional activity, Reliant on assistive device for balance Standing balance-Leahy Scale: Poor       Pertinent Vitals/Pain Pain Assessment Pain Assessment: No/denies pain    Home Living Family/patient expects to be discharged to:: Private residence Living Arrangements: Spouse/significant other Available Help at Discharge: Family;Available 24 hours/day Type of Home: House Home Access: Ramped entrance  Home Layout: One level Home Equipment: Cane - single point;Rollator (4 wheels) Additional Comments: Goes in through the ramp to the back door    Prior Function Prior Level of Function : Independent/Modified Independent  Mobility Comments: Ind with no AD in the home, SP cane in the community. Denies falls ADLs Comments: Ind, will use motorized cart in grocery store     Extremity/Trunk Assessment   Upper Extremity Assessment Upper Extremity Assessment: Defer to OT evaluation    Lower Extremity Assessment Lower Extremity Assessment: Generalized weakness    Cervical / Trunk Assessment Cervical / Trunk Assessment: Normal  Communication   Communication Communication: Impaired Factors  Affecting Communication: Hearing impaired    Cognition Arousal: Alert Behavior During  Therapy: WFL for tasks assessed/performed   PT - Cognitive impairments: No apparent impairments    Following commands: Intact       Cueing Cueing Techniques: Verbal cues      PT Assessment Patient needs continued PT services  PT Problem List Decreased strength;Decreased activity tolerance;Decreased balance;Decreased mobility;Cardiopulmonary status limiting activity       PT Treatment Interventions DME instruction;Gait training;Functional mobility training;Therapeutic activities;Therapeutic exercise;Balance training;Neuromuscular re-education;Patient/family education    PT Goals (Current goals can be found in the Care Plan section)  Acute Rehab PT Goals Patient Stated Goal: to get better PT Goal Formulation: With patient Time For Goal Achievement: 05/20/24 Potential to Achieve Goals: Good    Frequency Min 2X/week        AM-PAC PT 6 Clicks Mobility  Outcome Measure Help needed turning from your back to your side while in a flat bed without using bedrails?: None Help needed moving from lying on your back to sitting on the side of a flat bed without using bedrails?: None Help needed moving to and from a bed to a chair (including a wheelchair)?: A Little Help needed standing up from a chair using your arms (e.g., wheelchair or bedside chair)?: A Little Help needed to walk in hospital room?: A Little Help needed climbing 3-5 steps with a railing? : A Lot 6 Click Score: 19    End of Session Equipment Utilized During Treatment: Gait belt;Oxygen Activity Tolerance: Patient tolerated treatment well Patient left: in chair;with call bell/phone within reach;with chair alarm set Nurse Communication: Mobility status;Other (comment) (HR and O2 sats) PT Visit Diagnosis: Other abnormalities of gait and mobility (R26.89);Muscle weakness (generalized) (M62.81)    Time: 9165-9142 PT Time Calculation (min) (ACUTE ONLY): 23 min   Charges:   PT Evaluation $PT Eval Low Complexity: 1  Low   PT General Charges $$ ACUTE PT VISIT: 1 Visit       Kate ORN, PT, DPT Secure Chat Preferred  Rehab Office [phone]   Kate BRAVO Wendolyn 05/06/2024, 9:02 AM

## 2024-05-07 DIAGNOSIS — I5033 Acute on chronic diastolic (congestive) heart failure: Secondary | ICD-10-CM

## 2024-05-07 DIAGNOSIS — Z9289 Personal history of other medical treatment: Secondary | ICD-10-CM | POA: Diagnosis not present

## 2024-05-07 DIAGNOSIS — I4891 Unspecified atrial fibrillation: Secondary | ICD-10-CM | POA: Diagnosis not present

## 2024-05-07 DIAGNOSIS — E782 Mixed hyperlipidemia: Secondary | ICD-10-CM

## 2024-05-07 DIAGNOSIS — I10 Essential (primary) hypertension: Secondary | ICD-10-CM | POA: Diagnosis not present

## 2024-05-07 DIAGNOSIS — J9601 Acute respiratory failure with hypoxia: Secondary | ICD-10-CM | POA: Diagnosis not present

## 2024-05-07 DIAGNOSIS — E876 Hypokalemia: Secondary | ICD-10-CM | POA: Diagnosis not present

## 2024-05-07 DIAGNOSIS — I4819 Other persistent atrial fibrillation: Secondary | ICD-10-CM | POA: Diagnosis not present

## 2024-05-07 DIAGNOSIS — E039 Hypothyroidism, unspecified: Secondary | ICD-10-CM | POA: Diagnosis not present

## 2024-05-07 LAB — CBC
HCT: 38.2 % (ref 36.0–46.0)
Hemoglobin: 12.3 g/dL (ref 12.0–15.0)
MCH: 30.1 pg (ref 26.0–34.0)
MCHC: 32.2 g/dL (ref 30.0–36.0)
MCV: 93.4 fL (ref 80.0–100.0)
Platelets: 225 K/uL (ref 150–400)
RBC: 4.09 MIL/uL (ref 3.87–5.11)
RDW: 14.3 % (ref 11.5–15.5)
WBC: 10.1 K/uL (ref 4.0–10.5)
nRBC: 0 % (ref 0.0–0.2)

## 2024-05-07 LAB — BASIC METABOLIC PANEL WITH GFR
Anion gap: 9 (ref 5–15)
BUN: 18 mg/dL (ref 8–23)
CO2: 35 mmol/L — ABNORMAL HIGH (ref 22–32)
Calcium: 8.6 mg/dL — ABNORMAL LOW (ref 8.9–10.3)
Chloride: 95 mmol/L — ABNORMAL LOW (ref 98–111)
Creatinine, Ser: 1.15 mg/dL — ABNORMAL HIGH (ref 0.44–1.00)
GFR, Estimated: 46 mL/min — ABNORMAL LOW
Glucose, Bld: 100 mg/dL — ABNORMAL HIGH (ref 70–99)
Potassium: 3.1 mmol/L — ABNORMAL LOW (ref 3.5–5.1)
Sodium: 139 mmol/L (ref 135–145)

## 2024-05-07 LAB — MAGNESIUM: Magnesium: 2.2 mg/dL (ref 1.7–2.4)

## 2024-05-07 MED ORDER — METOPROLOL TARTRATE 25 MG PO TABS
25.0000 mg | ORAL_TABLET | Freq: Two times a day (BID) | ORAL | Status: DC
  Administered 2024-05-07: 25 mg via ORAL
  Filled 2024-05-07: qty 1

## 2024-05-07 MED ORDER — AMIODARONE HCL 200 MG PO TABS: 200.0000 mg | ORAL_TABLET | Freq: Every day | ORAL | Status: DC

## 2024-05-07 MED ORDER — AMIODARONE HCL 200 MG PO TABS: 400.0000 mg | ORAL_TABLET | Freq: Two times a day (BID) | ORAL | Status: DC

## 2024-05-07 MED ORDER — AMIODARONE HCL 200 MG PO TABS: 200.0000 mg | ORAL_TABLET | Freq: Once | ORAL | Status: DC

## 2024-05-07 MED ORDER — POTASSIUM CHLORIDE CRYS ER 10 MEQ PO TBCR
40.0000 meq | EXTENDED_RELEASE_TABLET | ORAL | Status: AC
  Administered 2024-05-07 (×2): 40 meq via ORAL
  Filled 2024-05-07 (×2): qty 4

## 2024-05-07 MED ORDER — AMIODARONE HCL 200 MG PO TABS
200.0000 mg | ORAL_TABLET | Freq: Two times a day (BID) | ORAL | Status: DC
  Administered 2024-05-07 – 2024-05-08 (×2): 200 mg via ORAL
  Filled 2024-05-07 (×2): qty 1

## 2024-05-07 MED ORDER — FUROSEMIDE 10 MG/ML IJ SOLN
40.0000 mg | Freq: Every day | INTRAMUSCULAR | Status: DC
  Administered 2024-05-08: 40 mg via INTRAVENOUS
  Filled 2024-05-07: qty 4

## 2024-05-07 NOTE — Progress Notes (Signed)
 " PROGRESS NOTE    Carla Petersen  FMW:[id number] DOB: 23-Jul-1940 DOA: 04/29/2024 PCP: Ransom Other, MD    Chief Complaint  Patient presents with   Shortness of Breath   Leg Swelling    Brief Narrative:  Carla Petersen is a 83 y.o. female with medical history significant of CHF, Graves who presented to the emergency department due to heart palpitations -noted to be in A-fib/flutter with worsening shortness of breath orthopnea and dyspnea with exertion consistent with heart failure exacerbation.  Cardiology called in consult, hospitalist called to admit.    Assessment & Plan:   Principal Problem:   Acute exacerbation of CHF (congestive heart failure) (HCC) Active Problems:   Acute on chronic diastolic heart failure (HCC)   Acute respiratory failure with hypoxia (HCC)   Atrial fibrillation with rapid ventricular response (HCC)   Prolonged QT interval   Hypokalemia   Hypothyroidism   Hypertension   Pressure injury of skin   Persistent atrial fibrillation (HCC)   History of cardioversion   Mixed hyperlipidemia   Acute on chronic heart failure with preserved ejection fraction (HFpEF) (HCC)  #1 acute on chronic diastolic heart failure, preserved EF -Patient presented with palpitation, worsening shortness of breath and dyspnea consistent with CHF exacerbation. - Patient being followed in consultation by cardiology as patient with soft/low blood pressure. - Current goal-directed medical therapy limited by patient's hypotension. - Patient status postthoracentesis on the right 05/01/2024, left thoracentesis on hold given improvement in the effusion. - Oxygen weaned to 4 L nasal cannula with goal sats of 88 to 91% at this point in time. - Repeat CT chest done with bilateral ongoing effusions. - Lights criteria not met although pleural/serum LDH ratio 0.58 with cutoff of 0.6 consistent with CHF exacerbation. - Ongoing tachycardia. - Was on IV amiodarone  however infiltrated and as such  IV heparin discontinued and patient on oral amiodarone . - Repeat 2D echo 12/13 with a EF greater than 75% with hyperdynamic function, indeterminate diastolic parameters, severely dilated left atrial size, mildly dilated right atrial size, moderate pleural effusion in the left lateral region.  Mild to moderate MVR.  No evidence of mitral stenosis.  Mild aortic valve stenosis. - Patient was on IV Lasix  and dose decreased per cardiology patient was on Lasix  40 mg IV every 12 hours. -Urine output of 1.2 L recorded over the past 24 hours. -Patient is -7.3 L during this hospitalization. - Current weight of 164.3 pounds (05/06/2024) from 169 pounds from as high as 171 pounds on 05/03/2024. -IV Lasix  dose decreased to 40 mg daily per cardiology today. - Per cardiology.  2.  Acute hypoxic respiratory failure - Secondary to problem #1. - Chest x-ray with bilateral pleural effusions, patient on diuretics. - O2 supplementation initially 6 L nasal cannula went up as high as 10 L high flow nasal cannula being weaned down as tolerated currently on 4 L nasal cannula with sats of 91%. - Patient resumed back on IV Lasix  per cardiology and dose being adjusted..  3.  Persistent A-fib -Patient was on IV amiodarone  however due to infiltration has been transitioned to oral amiodarone . - Patient seen in consultation by cardiology and patient underwent TEE DCCV (05/05/2024) however noted per cardiology that patient converted to normal sinus rhythm for several beats before returning back to atrial fibrillation. -Patient noted to be going in and out of A-fib and currently in A-fib this morning. - Eliquis  for anticoagulation. - Per cardiology.  4.  Hyperlipidemia - Continue Lipitor.  5.  Hypertension -Due to soft/borderline blood pressure metoprolol  discontinued. - Patient on IV Lasix  as noted above which was discontinued per cardiology on 05/05/2024, IV Lasix  at 40 mg every 12 hours resumed on 05/06/2024 and dose  decreased to once daily today, 05/07/2024 per cardiology.   6.  Hypothyroidism - Continue Synthroid .   7.  Hypokalemia -Secondary to diuresis. - Potassium at 3.1 this morning  - KCl 40 mEq p.o. every 4 hours x 2 doses. - Magnesium  at 2.2. - Repeat labs in the a.m.   8.  Pressure injury, POA Wound 04/30/24 1250 Pressure Injury Buttocks Medial;Bilateral Stage 1 -  Intact skin with non-blanchable redness of a localized area usually over a bony prominence. (Active)       DVT prophylaxis: Eliquis  Code Status: Full Family Communication: Updated patient.  No family at bedside. Disposition: TBD  Status is: Inpatient Remains inpatient appropriate because: Severity of illness   Consultants:  Cardiology: Dr. Kriste 04/29/2024 Electrophysiology: Ozell Passey, PA 05/05/2024  Procedures:  2D echo 04/30/2024 TEE cardioversion 05/05/2024-per Dr. Emery converted to normal sinus rhythm for several beats before returning to atrial fibrillation. Right ultrasound-guided thoracentesis per Dr. Laurence 05/01/2024  Antimicrobials:  Anti-infectives (From admission, onward)    None         Subjective: Patient laying in bed.  Denies any chest pain.  No significant shortness of breath.  Denies any abdominal pain.  Excited they were able to get smaller potassium pills for her this morning.     Objective: Vitals:   05/07/24 0300 05/07/24 0312 05/07/24 0439 05/07/24 0800  BP:  (!) 102/53  (!) 96/58  Pulse:  (!) 113 96 (!) 114  Resp:  (!) 27 17 19   Temp: 98.1 F (36.7 C) 98.1 F (36.7 C)  97.8 F (36.6 C)  TempSrc: Oral   Oral  SpO2:  94% 96% 91%  Weight:      Height:        Intake/Output Summary (Last 24 hours) at 05/07/2024 1209 Last data filed at 05/07/2024 0700 Gross per 24 hour  Intake 1174.32 ml  Output 1200 ml  Net -25.68 ml   Filed Weights   05/05/24 0500 05/05/24 1017 05/06/24 0535  Weight: 77.5 kg 76.7 kg 74.5 kg    Examination:  General exam:  NAD Respiratory system: Bibasilar crackles.  No wheezing.  Fair air movement.  No use of accessory muscles of respiration.  Speaking in full sentences.  Cardiovascular system: Irregularly irregular.  No significant pitting lower extremity edema.    Gastrointestinal system: Abdomen soft, nontender, nondistended, positive bowel sounds.  No rebound.  No guarding.  Central nervous system: Alert and oriented. No focal neurological deficits. Extremities: Symmetric 5 x 5 power. Skin: No rashes, lesions or ulcers Psychiatry: Judgement and insight appear normal. Mood & affect appropriate.     Data Reviewed: I have personally reviewed following labs and imaging studies  CBC: Recent Labs  Lab 05/03/24 1027 05/06/24 0314 05/07/24 0412  WBC 9.1 9.0 10.1  NEUTROABS 7.5 6.5  --   HGB 13.3 12.4 12.3  HCT 41.0 37.8 38.2  MCV 93.4 94.3 93.4  PLT 219 231 225    Basic Metabolic Panel: Recent Labs  Lab 05/02/24 0343 05/03/24 0509 05/05/24 0231 05/05/24 1933 05/06/24 0314 05/07/24 0412  NA 138 136 139 139 138 139  K 3.7 3.2* 2.9* 3.8 3.2* 3.1*  CL 95* 91* 91* 93* 93* 95*  CO2 38* 38* 40* 38* 37* 35*  GLUCOSE 97 112*  102* 188* 93 100*  BUN 16 16 15 16 17 18   CREATININE 1.20* 1.15* 1.10* 1.18* 1.10* 1.15*  CALCIUM  8.1* 8.0* 8.6* 8.9 8.6* 8.6*  MG 1.8 2.0  --  1.9 1.8 2.2    GFR: Estimated Creatinine Clearance: 34.6 mL/min (A) (by C-G formula based on SCr of 1.15 mg/dL (H)).  Liver Function Tests: Recent Labs  Lab 05/01/24 1745  PROT 6.7  ALBUMIN 2.5*    CBG: No results for input(s): GLUCAP in the last 168 hours.   Recent Results (from the past 240 hours)  MRSA Next Gen by PCR, Nasal     Status: None   Collection Time: 04/30/24 12:50 PM   Specimen: Nasal Mucosa; Nasal Swab  Result Value Ref Range Status   MRSA by PCR Next Gen NOT DETECTED NOT DETECTED Final    Comment: (NOTE) The GeneXpert MRSA Assay (FDA approved for NASAL specimens only), is one component of a  comprehensive MRSA colonization surveillance program. It is not intended to diagnose MRSA infection nor to guide or monitor treatment for MRSA infections. Test performance is not FDA approved in patients less than 6 years old. Performed at [facility], [address], [place], [place] [address]   Pleural fluid culture w Gram Stain     Status: None   Collection Time: 05/01/24  2:04 PM   Specimen: Pleural Fluid  Result Value Ref Range Status   Specimen Description PLEURAL  Final   Special Requests NONE  Final   Gram Stain NO WBC SEEN NO ORGANISMS SEEN   Final   Culture   Final    NO GROWTH 3 DAYS Performed at [facility], [address] [place], [place] [address]    Report Status 05/04/2024 FINAL  Final  Resp panel by RT-PCR (RSV, Flu A&B, Covid) Anterior Nasal Swab     Status: None   Collection Time: 05/05/24  6:43 PM   Specimen: Anterior Nasal Swab  Result Value Ref Range Status   SARS Coronavirus 2 by RT PCR NEGATIVE NEGATIVE Final   Influenza A by PCR NEGATIVE NEGATIVE Final   Influenza B by PCR NEGATIVE NEGATIVE Final    Comment: (NOTE) The Xpert Xpress SARS-CoV-2/FLU/RSV plus assay is intended as an aid in the diagnosis of influenza from Nasopharyngeal swab specimens and should not be used as a sole basis for treatment. Nasal washings and aspirates are unacceptable for Xpert Xpress SARS-CoV-2/FLU/RSV testing.  Fact Sheet for Patients: bloggercourse.com  Fact Sheet for Healthcare Providers: seriousbroker.it  This test is not yet approved or cleared by the [place]  FDA and has been authorized for detection and/or diagnosis of SARS-CoV-2 by FDA under an Emergency Use Authorization (EUA). This EUA will remain in effect (meaning this test can be used) for the duration of the COVID-19 declaration under Section 564(b)(1) of the Act, 21 U.S.C. section 360bbb-3(b)(1), unless the authorization is terminated  or revoked.     Resp Syncytial Virus by PCR NEGATIVE NEGATIVE Final    Comment: (NOTE) Fact Sheet for Patients: bloggercourse.com  Fact Sheet for Healthcare Providers: seriousbroker.it  This test is not yet approved or cleared by the [place]  FDA and has been authorized for detection and/or diagnosis of SARS-CoV-2 by FDA under an Emergency Use Authorization (EUA). This EUA will remain in effect (meaning this test can be used) for the duration of the COVID-19 declaration under Section 564(b)(1) of the Act, 21 U.S.C. section 360bbb-3(b)(1), unless the authorization is terminated or revoked.  Performed at [facility], [address] [place], [place] [address]          Radiology Studies: No results found.       Scheduled Meds:  amiodarone   200 mg Oral BID   Followed by   [name] ON 05/15/2024] amiodarone   200 mg Oral Daily   apixaban   5 mg Oral BID   atorvastatin   20 mg Oral Daily   [START ON 05/08/2024] furosemide   40 mg Intravenous Daily   levothyroxine   25 mcg Oral Q0600   metoprolol  tartrate  25 mg Oral BID   potassium chloride   40 mEq Oral Q4H   Continuous Infusions:     LOS: 8 days    Time spent: 40 minutes    Toribio Hummer, MD [facility]   To contact the attending provider between 7A-7P or the covering provider during after hours 7P-7A, please log into the web site [email or web address] and access using universal [place] password for that web site. If you do not have the password, please call the hospital operator.  05/07/2024, 12:09 PM    "

## 2024-05-07 NOTE — Plan of Care (Signed)

## 2024-05-07 NOTE — Progress Notes (Signed)
 "  Rounding Note   Patient Name: Carla Petersen Date of Encounter: 05/07/2024  [facility] Cardiologist: Lonni LITTIE Nanas, MD   Subjective No events overnight  Back in AFibcVR No family at bedside.   Scheduled Meds:  amiodarone   200 mg Oral BID   apixaban   5 mg Oral BID   atorvastatin   20 mg Oral Daily   furosemide   40 mg Intravenous BID   levothyroxine   25 mcg Oral Q0600   potassium chloride   40 mEq Oral Q4H   Continuous Infusions:  PRN Meds: acetaminophen  **OR** acetaminophen , ondansetron  **OR** ondansetron  (ZOFRAN ) IV, mouth rinse   Vital Signs  Vitals:   05/06/24 2338 05/07/24 0300 05/07/24 0312 05/07/24 0439  BP: 100/66  (!) 102/53   Pulse: (!) 108  (!) 113 96  Resp: 20  (!) 27 17  Temp: 98.3 F (36.8 C) 98.1 F (36.7 C) 98.1 F (36.7 C)   TempSrc: Oral Oral    SpO2: 93%  94% 96%  Weight:      Height:        Intake/Output Summary (Last 24 hours) at 05/07/2024 0810 Last data filed at 05/06/2024 2000 Gross per 24 hour  Intake 934.32 ml  Output 700 ml  Net 234.32 ml   Net IO Since Admission: -6,898.67 mL [05/07/24 0811]     05/06/2024    5:35 AM 05/05/2024   10:17 AM 05/05/2024    5:00 AM  Last 3 Weights  Weight (lbs) 164 lb 4.8 oz 169 lb 170 lb 13.7 oz  Weight (kg) 74.526 kg 76.658 kg 77.5 kg      Telemetry Atrial fibrillation with cVR- Personally Reviewed  ECG  No new ECG  Physical Exam  GEN: No acute distress.   Neck: no JVD Cardiac: iRRR, II/VI systolic murmur, no rubs, or gallops.  Respiratory: Bibasilar rales, decreased breath sounds bilateral, no wheezes or rhonchi GI: Soft, nontender, non-distended  MS: Trace edema bilaterally; warm, no deformity Neuro:  Nonfocal  Psych: Normal affect   Labs High Sensitivity Troponin:   Recent Labs  Lab 04/29/24 1624 04/29/24 1957  TROPONINIHS 7 8   No results for input(s): TRNPT in the last 720 hours.     Chemistry Recent Labs  Lab 04/30/24 0815 05/01/24 0325  05/01/24 1745 05/02/24 0343 05/05/24 1933 05/06/24 0314 05/07/24 0412  NA  --    < >  --    < > 139 138 139  K  --    < >  --    < > 3.8 3.2* 3.1*  CL  --    < >  --    < > 93* 93* 95*  CO2  --    < >  --    < > 38* 37* 35*  GLUCOSE  --    < >  --    < > 188* 93 100*  BUN  --    < >  --    < > 16 17 18   CREATININE  --    < >  --    < > 1.18* 1.10* 1.15*  CALCIUM   --    < >  --    < > 8.9 8.6* 8.6*  MG  --    < >  --    < > 1.9 1.8 2.2  PROT 6.4*  --  6.7  --   --   --   --   ALBUMIN 2.5*  --  2.5*  --   --   --   --  AST 21  --   --   --   --   --   --   ALT 17  --   --   --   --   --   --   ALKPHOS 128*  --   --   --   --   --   --   BILITOT 1.1  --   --   --   --   --   --   GFRNONAA  --    < >  --    < > 45* 49* 46*  ANIONGAP  --    < >  --    < > 8 7 9    < > = values in this interval not displayed.    Lipids No results for input(s): CHOL, TRIG, HDL, LABVLDL, LDLCALC, CHOLHDL in the last 168 hours.  Hematology Recent Labs  Lab 05/03/24 1027 05/06/24 0314 05/07/24 0412  WBC 9.1 9.0 10.1  RBC 4.39 4.01 4.09  HGB 13.3 12.4 12.3  HCT 41.0 37.8 38.2  MCV 93.4 94.3 93.4  MCH 30.3 30.9 30.1  MCHC 32.4 32.8 32.2  RDW 14.4 14.2 14.3  PLT 219 231 225   Thyroid  No results for input(s): TSH, FREET4 in the last 168 hours.  BNP Recent Labs  Lab 05/01/24 0325  BNP 396.0*    DDimer No results for input(s): DDIMER in the last 168 hours.   Radiology  ECHO TEE Result Date: 05/05/2024    TRANSESOPHOGEAL ECHO REPORT   Patient Name:   Carla Petersen Date of Exam: 05/05/2024 Medical Rec #:  [id number]      Height:       63.0 in Accession #:    [id number]     Weight:       169.0 lb Date of Birth:  1941-03-12       BSA:          1.800 m Patient Age:    83 years       BP:           96/52 mmHg Patient Gender: F              HR:           112 bpm. Exam Location:  Inpatient Procedure: Transesophageal Echo, 3D Echo, Color Doppler and Cardiac Doppler            (Both  Spectral and Color Flow Doppler were utilized during            procedure). Indications:     Atrial Fibrillation  History:         Patient has prior history of Echocardiogram examinations, most                  recent 04/30/2024. CHF, Arrythmias:Atrial Fibrillation; Risk                  Factors:Sleep Apnea.  Sonographer:     Koleen Popper RDCS Referring Phys:  [id number] [facility] A SANTO Diagnosing Phys: Stanly Santo MD PROCEDURE: After discussion of the risks and benefits of a TEE, an informed consent was obtained from the patient. The transesophogeal probe was passed without difficulty through the esophogus of the patient. Imaged were obtained with the patient in a left lateral decubitus position. Sedation performed by different physician. The patient was monitored while under deep sedation. Anesthestetic sedation was provided intravenously by Anesthesiology: 173mg  of Propofol . Image quality was good. The patient developed no  complications during the procedure. An unsuccessful direct current cardioversion was performed. 1st attempt at 200J, 2nd attempt at 250J, and 3rd attempt at 300J.  IMPRESSIONS  1. Left ventricular ejection fraction, by estimation, is 60 to 65%. The left ventricle has normal function.  2. Right ventricular systolic function is normal. The right ventricular size is normal.  3. Left atrial size was severely dilated. No left atrial/left atrial appendage thrombus was detected.  4. Right atrial size was severely dilated.  5. Moderate pleural effusion in the left lateral region.  6. The mitral valve is normal in structure. Mild to moderate mitral valve regurgitation. No evidence of mitral stenosis.  7. Functional bicuspid . 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 Aortic valve regurgitation is not visualized. No aortic stenosis is present.  8. 3D performed of the LAA and demonstrates Maximal landing zone 17 mm with wall distance 17 mm. FINDINGS  Left Ventricle: Left ventricular ejection fraction,  by estimation, is 60 to 65%. The left ventricle has normal function. The left ventricular internal cavity size was normal in size. Right Ventricle: The right ventricular size is normal. No increase in right ventricular wall thickness. Right ventricular systolic function is normal. Left Atrium: Left atrial size was severely dilated. No left atrial/left atrial appendage thrombus was detected. Right Atrium: Right atrial size was severely dilated. Pericardium: There is no evidence of pericardial effusion. Mitral Valve: The mitral valve is normal in structure. Mild to moderate mitral valve regurgitation. No evidence of mitral valve stenosis. Tricuspid Valve: The tricuspid valve is normal in structure. Tricuspid valve regurgitation is mild . No evidence of tricuspid stenosis. Aortic Valve: Functional bicuspid. 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 Aortic valve regurgitation is not visualized. No aortic stenosis is present. Pulmonic Valve: The pulmonic valve was not well visualized. Pulmonic valve regurgitation is not visualized. No evidence of pulmonic stenosis. Aorta: The aortic root and ascending aorta are structurally normal, with no evidence of dilitation. IAS/Shunts: No atrial level shunt detected by color flow Doppler. Additional Comments: There is a moderate pleural effusion in the left lateral region. Spectral Doppler performed. MR Peak grad: 102.8 mmHg  TRICUSPID VALVE MR Vmax:      507.00 cm/s TR Peak grad:   28.7 mmHg                           TR Vmax:        268.00 cm/s Stanly Leavens MD Electronically signed by Stanly Leavens MD Signature Date/Time: 05/05/2024/4:54:12 PM    Final    EP STUDY Result Date: 05/05/2024 See surgical note for result.   Cardiac Studies   TTE 04/30/24:  IMPRESSIONS     1. Left ventricular ejection fraction, by estimation, is >75%. The left  ventricle has hyperdynamic function. The left ventricle has no regional  wall motion abnormalities. Left ventricular  diastolic parameters are  indeterminate.   2. Right ventricular systolic function is normal. The right ventricular  size is normal. There is mildly elevated pulmonary artery systolic  pressure.   3. Left atrial size was severely dilated.   4. Right atrial size was mildly dilated.   5. Moderate pleural effusion in the left lateral region.   6. The mitral valve is normal in structure. Mild to moderate mitral valve  regurgitation. No evidence of mitral stenosis.   7. The aortic valve is calcified. Aortic valve regurgitation is not  visualized. Mild aortic valve stenosis.   8. The inferior vena cava is  dilated in size with >50% respiratory  variability, suggesting right atrial pressure of 8 mmHg.   Patient Profile   Ms. Granier is a 83 y.o. female with a PMH of persistent AF, HFpEF, HLD, Graves' disease who was admitted for acute hypoxic respiratory failure in the setting of acute on chronic HFpEF exacerbation and atrial fibrillation with RVR.  Cardiology was consulted for the evaluation of her cardiovascular issues.  Assessment & Plan    #Acute on Chronic HFpEF Exacerbation #Acute Hypoxic Respiratory Failure - Patient admitted with volume overload in the setting of decompensated heart failure. - Diuresis and GDMT has been challenging due to low blood pressures. - Net IO Since Admission: -6,898.67 mL [05/07/24 0812] - Ultimately the patient remains volume overloaded and does need to get additional fluid removed. - Respiratory viral panel negative Change Lasix  40 mg IV twice daily to qday Strict I's and O's, daily weights S/p thoracentesis R 12/14, s/p L thoracentesis 12/15 Incentive spirometry  Hopeful that as SBP increases will be able to add GDMT - focus on volume management and rhythm control prior to adding GDMT Decreased breath sounds bilaterally, will need to monitor lung status and evaluate for effusions prior to discharge  #Atrial Fibrillation with cVR - Patient found to be in  atrial fibrillation with RVR on admission. - She underwent unsuccessful DCCV on 12/1 and was discharged at an outside hospital on p.o. Amio. - Was placed on IV Amio gtt; however, she remains in A-fib with RVR. - Repeat TEE/DCCV on 12/18 was unsuccessful; however, the patient later converted to NSR on her own. - EP consulted and agreed with continuing amiodarone  without pursuing advanced treatments at this time. Continue oral amiodarone  load - will talk to pharmacy  Continue Eliquis  5 mg twice daily Maintain K>4, Mg>2 Maintain telemetry Will start low dose lopressor  with holding parameters.  Would like to avoid digoxin if possible due to low potassium and age - but may consider.   Longterm antiarrhythmic medication:  IV amiodarone  started 05/01/2024 - 05/04/2024 IV amiodarone  200 mg p.o. twice daily started 05/04/2024 No recent TSH levels checked, will add to morning labs AST, ALT within normal limits as of April 30, 2024 Last chest x-ray May 03, 2024 Patient is aware that she will need long-term monitoring of side effect profile if amiodarone  used long-term.  #LUE Amiodarone  Extravasation (improving) #Superficial Thrombophlebitis (improving) - LUE forearm  - no redness, necrosis, warm. Very mild tenderness  - I instructed the patient to be vigilant regarding her arm symptoms and to notify me if things worsen. Continue oral amiodarone  S/p hyaluronidase  12/16 Supportive care with warm compresses to upper extremity  #HLD Continue atorvastatin  20 mg daily  #Hypokalemia: currently on potassium supplements - per primary team.     For questions or updates, please contact [facility] Please consult [email or web address] for contact info under   Since last encounter, I have independently reviewed vital signs, telemetry, I/Os, pertinent lab results 05/07/2024   . I have changed medications including IV diuretics and added metoprolol . I have discussed pt's care plan with her  and RN I have reviewed the last note from Dr. Floretta from 05/06/2024 .    Signed, Chinita Schimpf Michele, DO  05/07/2024, 8:10 AM    "

## 2024-05-08 ENCOUNTER — Inpatient Hospital Stay (HOSPITAL_COMMUNITY)

## 2024-05-08 DIAGNOSIS — I4891 Unspecified atrial fibrillation: Secondary | ICD-10-CM | POA: Diagnosis not present

## 2024-05-08 DIAGNOSIS — I10 Essential (primary) hypertension: Secondary | ICD-10-CM | POA: Diagnosis not present

## 2024-05-08 DIAGNOSIS — I4819 Other persistent atrial fibrillation: Secondary | ICD-10-CM | POA: Diagnosis not present

## 2024-05-08 DIAGNOSIS — E782 Mixed hyperlipidemia: Secondary | ICD-10-CM | POA: Diagnosis not present

## 2024-05-08 DIAGNOSIS — Z79899 Other long term (current) drug therapy: Secondary | ICD-10-CM

## 2024-05-08 DIAGNOSIS — I5033 Acute on chronic diastolic (congestive) heart failure: Secondary | ICD-10-CM | POA: Diagnosis not present

## 2024-05-08 DIAGNOSIS — J9601 Acute respiratory failure with hypoxia: Secondary | ICD-10-CM | POA: Diagnosis not present

## 2024-05-08 DIAGNOSIS — Z9289 Personal history of other medical treatment: Secondary | ICD-10-CM | POA: Diagnosis not present

## 2024-05-08 DIAGNOSIS — E876 Hypokalemia: Secondary | ICD-10-CM | POA: Diagnosis not present

## 2024-05-08 LAB — BASIC METABOLIC PANEL WITH GFR
Anion gap: 8 (ref 5–15)
BUN: 21 mg/dL (ref 8–23)
CO2: 35 mmol/L — ABNORMAL HIGH (ref 22–32)
Calcium: 9 mg/dL (ref 8.9–10.3)
Chloride: 96 mmol/L — ABNORMAL LOW (ref 98–111)
Creatinine, Ser: 1.23 mg/dL — ABNORMAL HIGH (ref 0.44–1.00)
GFR, Estimated: 43 mL/min — ABNORMAL LOW
Glucose, Bld: 210 mg/dL — ABNORMAL HIGH (ref 70–99)
Potassium: 3.7 mmol/L (ref 3.5–5.1)
Sodium: 139 mmol/L (ref 135–145)

## 2024-05-08 LAB — MAGNESIUM: Magnesium: 2 mg/dL (ref 1.7–2.4)

## 2024-05-08 LAB — TSH: TSH: 5.09 u[IU]/mL — ABNORMAL HIGH (ref 0.350–4.500)

## 2024-05-08 MED ORDER — AMIODARONE HCL IN DEXTROSE 360-4.14 MG/200ML-% IV SOLN
30.0000 mg/h | INTRAVENOUS | Status: DC
  Administered 2024-05-09 – 2024-05-10 (×3): 30 mg/h via INTRAVENOUS
  Filled 2024-05-08 (×4): qty 200

## 2024-05-08 MED ORDER — POTASSIUM CHLORIDE CRYS ER 20 MEQ PO TBCR
40.0000 meq | EXTENDED_RELEASE_TABLET | Freq: Once | ORAL | Status: AC
  Administered 2024-05-08: 40 meq via ORAL
  Filled 2024-05-08: qty 2

## 2024-05-08 MED ORDER — AMIODARONE HCL IN DEXTROSE 360-4.14 MG/200ML-% IV SOLN
60.0000 mg/h | INTRAVENOUS | Status: AC
  Administered 2024-05-08: 60 mg/h via INTRAVENOUS
  Filled 2024-05-08: qty 200

## 2024-05-08 MED ORDER — METOPROLOL TARTRATE 25 MG PO TABS
25.0000 mg | ORAL_TABLET | Freq: Three times a day (TID) | ORAL | Status: DC
  Administered 2024-05-08 (×2): 25 mg via ORAL
  Filled 2024-05-08 (×2): qty 1

## 2024-05-08 NOTE — Consult Note (Signed)
 " Electrophysiology Consultation:   Patient ID: Carla Petersen MRN: [id number]; DOB: Oct 27, 1940  Admit date: 04/29/2024 Date of Consult: 05/08/2024  Primary Care Provider: Ransom Other, MD [facility] Cardiologist: Lonni LITTIE Nanas, MD  [facility] Electrophysiologist: Adina Primus, MD   Patient Profile:   83 y.o. female with persistent AF/RVR, Graves' disease, HLD, prediabetes, prior breast cancer, and HFpEF who presented with hypoxic respiratory failure and AF/RVR. v  History of Present Illness:   She initially presented on 04/12/2024 to [facility] with shortness of breath and palpitations and was found to be in AF/RVR with ventricular rates in the 140s. She was hypoxic to 83% on admission and CXR showed moderate right sided pleural effusion and a small left-sided pleural effusion with a negative CT for pulmonary embolism. She was started on antibiotics and diuresed and underwent unsuccessful DCCV on 04/18/2024. She was then started on amiodarone  gtt with conversion to p.o. load. She also had a pericardial effusion in addition to her bilateral pleural effusions and had a mildly elevated BNP of 249. She underwent right sided thoracentesis with removal of 500 cc fluids on 04/13/2024. Her TTE with preserved LVEF of 55-60%. She was discharged on 2L [place] with continued amiodarone  load. She was seen by general cardiology on 12/12 for follow-up and was still having shortness of breath, hypoxia, and lower extremity edema but was maintaining sinus rhythm. She was hypokalemic to 2.7 and was sent to the hospital for further diuresis. She underwent TEE and DCCV (200J/250J/300J) with brief return of NSR before immediate conversion back to AF.   I last saw her on 05/05/2024 and she had chemically converted to NSR on amiodarone .  We had discussed different options for management but with her age, comorbidities and hypoxic respiratory failure her options for rhythm management were limited  to amiodarone  versus PPM+AVNA since she has recurrent AF/RVR.  I do not think I can safely get her to ablation with her current respiratory failure.  She unfortunately had returned of AF/RVR yesterday which has contributed to her HFpEF exacerbation and decompensation.  Her GDMT has been challenging with borderline hypotension.  Past Medical History:  Diagnosis Date   CHF (congestive heart failure) (HCC)    Graves disease    Personal history of radiation therapy    Left Breast Cancer   Past Surgical History:  Procedure Laterality Date   BREAST EXCISIONAL BIOPSY Left    BREAST EXCISIONAL BIOPSY Left    BREAST LUMPECTOMY Left 1995   CARDIOVERSION N/A 05/05/2024   Procedure: CARDIOVERSION;  Surgeon: Santo Stanly LABOR, MD;  Location: [facility] INVASIVE CV LAB;  Service: Cardiovascular;  Laterality: N/A;   MASTECTOMY Left    PR THORACENTESIS NEEDLE/CATH PLEURA W/IMAGING  05/01/2024   TRANSESOPHAGEAL ECHOCARDIOGRAM (CATH LAB) N/A 05/05/2024   Procedure: TRANSESOPHAGEAL ECHOCARDIOGRAM;  Surgeon: Santo Stanly LABOR, MD;  Location: [facility] INVASIVE CV LAB;  Service: Cardiovascular;  Laterality: N/A;   Inpatient Medications: Scheduled Meds:  apixaban   5 mg Oral BID   atorvastatin   20 mg Oral Daily   furosemide   40 mg Intravenous Daily   levothyroxine   25 mcg Oral Q0600   metoprolol  tartrate  25 mg Oral TID   potassium chloride   40 mEq Oral Once   Continuous Infusions:  amiodarone      amiodarone      Allergies:   Allergies[1]  Social History:   Social History   Socioeconomic History   Marital status: Married    Spouse name: Not on file   Number of children:  Not on file   Years of education: Not on file   Highest education level: Not on file  Occupational History   Not on file  Tobacco Use   Smoking status: Former   Smokeless tobacco: Former    Quit date: 07/06/1993  Substance and Sexual Activity   Alcohol use: No   Drug use: No   Sexual activity: Not on file  Other Topics  Concern   Not on file  Social History Narrative   Not on file   Social Drivers of Health   Tobacco Use: Medium Risk (05/05/2024)   Patient History    Smoking Tobacco Use: Former    Smokeless Tobacco Use: Former    Passive Exposure: Not on Actuary Strain: Not on file  Food Insecurity: No Food Insecurity (04/30/2024)   Epic    Worried About Programme Researcher, Broadcasting/film/video in the Last Year: Never true    Ran Out of Food in the Last Year: Never true  Transportation Needs: No Transportation Needs (04/30/2024)   Epic    Lack of Transportation (Medical): No    Lack of Transportation (Non-Medical): No  Physical Activity: Not on file  Stress: Not on file  Social Connections: Moderately Isolated (04/30/2024)   Social Connection and Isolation Panel    Frequency of Communication with Friends and Family: Twice a week    Frequency of Social Gatherings with Friends and Family: Twice a week    Attends Religious Services: Never    Database Administrator or Organizations: No    Attends Banker Meetings: Never    Marital Status: Married  Catering Manager Violence: Not At Risk (04/30/2024)   Epic    Fear of Current or Ex-Partner: No    Emotionally Abused: No    Physically Abused: No    Sexually Abused: No  Depression (PHQ2-9): Not on file  Alcohol Screen: Not on file  Housing: Low Risk (04/30/2024)   Epic    Unable to Pay for Housing in the Last Year: No    Number of Times Moved in the Last Year: 0    Homeless in the Last Year: No  Utilities: Not At Risk (04/30/2024)   Epic    Threatened with loss of utilities: No  Health Literacy: Not on file    Family History:    Family History  Problem Relation Age of Onset   Breast cancer Neg Hx     ROS:  Review of Systems: [y] = yes, [ ]  = no      General: Weight gain [ ] ; Weight loss [ ] ; Anorexia [ ] ; Fatigue [ ] ; Fever [ ] ; Chills [ ] ; Weakness [ ]    Cardiac: Chest pain/pressure [ ] ; Resting SOB [y]; Exertional SOB [  ]; Orthopnea [ ] ; Pedal Edema [ ] ; Palpitations [ ] ; Syncope [ ] ; Presyncope [ ] ; Paroxysmal nocturnal dyspnea [ ]    Pulmonary: Cough [ ] ; Wheezing [ ] ; Hemoptysis [ ] ; Sputum [ ] ; Snoring [ ]    GI: Vomiting [ ] ; Dysphagia [ ] ; Melena [ ] ; Hematochezia [ ] ; Heartburn [ ] ; Abdominal pain [ ] ; Constipation [ ] ; Diarrhea [ ] ; BRBPR [ ]    GU: Hematuria [ ] ; Dysuria [ ] ; Nocturia [ ]  Vascular: Pain in legs with walking [ ] ; Pain in feet with lying flat [ ] ; Non-healing sores [ ] ; Stroke [ ] ; TIA [ ] ; Slurred speech [ ] ;   Neuro: Headaches [ ] ; Vertigo [ ] ; Seizures [ ] ;  Paresthesias [ ] ;Blurred vision [ ] ; Diplopia [ ] ; Vision changes [ ]    Ortho/Skin: Arthritis [ ] ; Joint pain [ ] ; Muscle pain [ ] ; Joint swelling [ ] ; Back Pain [ ] ; Rash [ ]    Psych: Depression [ ] ; Anxiety [ ]    Heme: Bleeding problems [ ] ; Clotting disorders [ ] ; Anemia [ ]    Endocrine: Diabetes [ ] ; Thyroid  dysfunction [ ]    Physical Exam/Data:   Vitals:   05/07/24 1926 05/07/24 2357 05/08/24 0454 05/08/24 0753  BP: 94/63 (!) 108/54 (!) 97/48 (!) 114/52  Pulse: 90 75 73 (!) 108  Resp: 15 (!) 23 18 17   Temp: 98 F (36.7 C) 98 F (36.7 C) 98.3 F (36.8 C) 98.2 F (36.8 C)  TempSrc: Oral Oral Oral Oral  SpO2: 98% 96% 93% 96%  Weight:   68.3 kg   Height:       Intake/Output Summary (Last 24 hours) at 05/08/2024 1100 Last data filed at 05/07/2024 2100 Gross per 24 hour  Intake 360 ml  Output 650 ml  Net -290 ml      05/08/2024    4:54 AM 05/06/2024    5:35 AM 05/05/2024   10:17 AM  Last 3 Weights  Weight (lbs) 150 lb 9.2 oz 164 lb 4.8 oz 169 lb  Weight (kg) 68.3 kg 74.526 kg 76.658 kg     Body mass index is 26.67 kg/m.  Gen: stable, conversant  Neck: no JVD Cardiac: RRR, 2/6 systolic murmur, no gallops or rubs Lungs: basilar rales b/l, no wheezes/rhonchi  Extremities: no significant edema  ECG review 05/02/24: AF/RVR 123, QRS 90, QT/c 334/478 05/01/24: AF/RVR 112, QRS 88, QT/c 334/455 04/30/24: AF/RVR  (read as AF) 109, QRS 90, QT/c 344/463 04/29/24: NSR 70, PR 154, QRS 90, QT/c 504/544 04/29/24: NSR 71, PR 152, QRS 90, QT/c 558/606 09/17/23: NSR 72, PR 126, QRS 90, QT/c 376/411 03/16/23: NSR 68, PR 124, QRS 92, QT/c 386/410  07/08/16: ST 110, PR 132, QRS 88, QT/c 338/457  Telemetry:  Telemetry was personally reviewed and demonstrates:  AF/RVR and AF/VR since 12/20 AM   Relevant CV Studies: TTE  Result date: 04/30/24  1. Left ventricular ejection fraction, by estimation, is >75%. The left  ventricle has hyperdynamic function. The left ventricle has no regional  wall motion abnormalities. Left ventricular diastolic parameters are  indeterminate.   2. Right ventricular systolic function is normal. The right ventricular  size is normal. There is mildly elevated pulmonary artery systolic  pressure.   3. Left atrial size was severely dilated.   4. Right atrial size was mildly dilated.   5. Moderate pleural effusion in the left lateral region.   6. The mitral valve is normal in structure. Mild to moderate mitral valve  regurgitation. No evidence of mitral stenosis.   7. The aortic valve is calcified. Aortic valve regurgitation is not  visualized. Mild aortic valve stenosis.   8. The inferior vena cava is dilated in size with >50% respiratory  variability, suggesting right atrial pressure of 8 mmHg.   Laboratory Data:  High Sensitivity Troponin:   Recent Labs  Lab 04/29/24 1624 04/29/24 1957  TROPONINIHS 7 8     Chemistry Recent Labs  Lab 05/06/24 0314 05/07/24 0412 05/08/24 0838  NA 138 139 139  K 3.2* 3.1* 3.7  CL 93* 95* 96*  CO2 37* 35* 35*  GLUCOSE 93 100* 210*  BUN 17 18 21   CREATININE 1.10* 1.15* 1.23*  CALCIUM  8.6* 8.6*  9.0  GFRNONAA 49* 46* 43*  ANIONGAP 7 9 8     Recent Labs  Lab 05/01/24 1745  PROT 6.7  ALBUMIN 2.5*   Hematology Recent Labs  Lab 05/03/24 1027 05/06/24 0314 05/07/24 0412  WBC 9.1 9.0 10.1  RBC 4.39 4.01 4.09  HGB 13.3 12.4 12.3   HCT 41.0 37.8 38.2  MCV 93.4 94.3 93.4  MCH 30.3 30.9 30.1  MCHC 32.4 32.8 32.2  RDW 14.4 14.2 14.3  PLT 219 231 225   Radiology/Studies:  ECHO TEE Result Date: 05/05/2024    TRANSESOPHOGEAL ECHO REPORT   Patient Name:   Carla Petersen Date of Exam: 05/05/2024 Medical Rec #:  [id number]      Height:       63.0 in Accession #:    [id number]     Weight:       169.0 lb Date of Birth:  [birth year]       BSA:          1.800 m Patient Age:    83 years       BP:           96/52 mmHg Patient Gender: F              HR:           112 bpm. Exam Location:  Inpatient Procedure: Transesophageal Echo, 3D Echo, Color Doppler and Cardiac Doppler            (Both Spectral and Color Flow Doppler were utilized during            procedure). Indications:     Atrial Fibrillation  History:         Patient has prior history of Echocardiogram examinations, most                  recent 04/30/2024. CHF, Arrythmias:Atrial Fibrillation; Risk                  Factors:Sleep Apnea.  Sonographer:     Koleen Popper RDCS Referring Phys:  [id number] [facility] A SANTO Diagnosing Phys: Stanly Santo MD PROCEDURE: After discussion of the risks and benefits of a TEE, an informed consent was obtained from the patient. The transesophogeal probe was passed without difficulty through the esophogus of the patient. Imaged were obtained with the patient in a left lateral decubitus position. Sedation performed by different physician. The patient was monitored while under deep sedation. Anesthestetic sedation was provided intravenously by Anesthesiology: 173mg  of Propofol . Image quality was good. The patient developed no complications during the procedure. An unsuccessful direct current cardioversion was performed. 1st attempt at 200J, 2nd attempt at 250J, and 3rd attempt at 300J.  IMPRESSIONS  1. Left ventricular ejection fraction, by estimation, is 60 to 65%. The left ventricle has normal function.  2. Right ventricular systolic function is  normal. The right ventricular size is normal.  3. Left atrial size was severely dilated. No left atrial/left atrial appendage thrombus was detected.  4. Right atrial size was severely dilated.  5. Moderate pleural effusion in the left lateral region.  6. The mitral valve is normal in structure. Mild to moderate mitral valve regurgitation. No evidence of mitral stenosis.  7. Functional bicuspid . 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 Aortic valve regurgitation is not visualized. No aortic stenosis is present.  8. 3D performed of the LAA and demonstrates Maximal landing zone 17 mm with wall distance 17 mm. FINDINGS  Left Ventricle: Left ventricular ejection fraction, by estimation,  is 60 to 65%. The left ventricle has normal function. The left ventricular internal cavity size was normal in size. Right Ventricle: The right ventricular size is normal. No increase in right ventricular wall thickness. Right ventricular systolic function is normal. Left Atrium: Left atrial size was severely dilated. No left atrial/left atrial appendage thrombus was detected. Right Atrium: Right atrial size was severely dilated. Pericardium: There is no evidence of pericardial effusion. Mitral Valve: The mitral valve is normal in structure. Mild to moderate mitral valve regurgitation. No evidence of mitral valve stenosis. Tricuspid Valve: The tricuspid valve is normal in structure. Tricuspid valve regurgitation is mild . No evidence of tricuspid stenosis. Aortic Valve: Functional bicuspid. 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 Aortic valve regurgitation is not visualized. No aortic stenosis is present. Pulmonic Valve: The pulmonic valve was not well visualized. Pulmonic valve regurgitation is not visualized. No evidence of pulmonic stenosis. Aorta: The aortic root and ascending aorta are structurally normal, with no evidence of dilitation. IAS/Shunts: No atrial level shunt detected by color flow Doppler. Additional Comments: There is a moderate pleural  effusion in the left lateral region. Spectral Doppler performed. MR Peak grad: 102.8 mmHg  TRICUSPID VALVE MR Vmax:      507.00 cm/s TR Peak grad:   28.7 mmHg                           TR Vmax:        268.00 cm/s Stanly Leavens MD Electronically signed by Stanly Leavens MD Signature Date/Time: 05/05/2024/4:54:12 PM    Final    EP STUDY Result Date: 05/05/2024 See surgical note for result.  Assessment and Plan:  My Assessment and Plan: 83 y.o. 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   Persistent AF/RVR Hypoxic respiratory failure  HFpEF  ADHF Ms. Gouger has new onset AF/RVR in the setting of hypoxic respiratory failure.  She underwent repeat DCCV 12/18 with only brief return of NSR.  She has had multiple amiodarone  loads over the past several weeks and should be well over 10 g at this point however still continues to have AF/RVR with difficult to manage HFpEF.  No obvious cause for her AF recurrence or persistence.  We spoke about different options for management of her AF especially since she keeps having RVR.  For now I would continue on amiodarone  and add digoxin if her episodes of RVR become more frequent.  She is relatively rate controlled at this moment with VR in the 90s to 100.  If the AF/RVR continues despite ongoing amiodarone  load then we could consider a pace/ablate strategy in the future however I would try to avoid this as I do not necessarily think this can help with her HFpEF since she may be more dependent on atrial synchrony than other patients.  For now I would continue amiodarone  load.  Fortunately she is overall asymptomatic with regards to the AF although it likely is contributing to her HFpEF exacerbation and inability to wean off oxygen.  For questions or updates, please contact [facility] Please consult  [email or web address] for contact info under   Signed, Donnice DELENA Primus, MD  05/08/2024 11:00 AM     [1]  Allergies Allergen Reactions   Latex Swelling   Penicillins Rash    Total body rash from ankles to neck.   Sulfa Antibiotics Nausea And  Vomiting   Pedi-Pre Tape Spray [Wound Dressing Adhesive] Rash    A band-aid caused rash on finger   "

## 2024-05-08 NOTE — Plan of Care (Signed)

## 2024-05-08 NOTE — Progress Notes (Signed)
 " PROGRESS NOTE    [name]  FMW:[id number] DOB: [birth year] DOA: 04/29/2024 PCP: Ransom Other, MD    Chief Complaint  Patient presents with   Shortness of Breath   Leg Swelling    Brief Narrative:  Carla Petersen is a 83 y.o. female with medical history significant of CHF, Graves who presented to the emergency department due to heart palpitations -noted to be in A-fib/flutter with worsening shortness of breath orthopnea and dyspnea with exertion consistent with heart failure exacerbation.  Cardiology called in consult, hospitalist called to admit.    Assessment & Plan:   Principal Problem:   Acute exacerbation of CHF (congestive heart failure) (HCC) Active Problems:   Acute on chronic diastolic heart failure (HCC)   Acute respiratory failure with hypoxia (HCC)   Atrial fibrillation with rapid ventricular response (HCC)   Prolonged QT interval   Hypokalemia   Hypothyroidism   Hypertension   Pressure injury of skin   Persistent atrial fibrillation (HCC)   History of cardioversion   Mixed hyperlipidemia   Acute on chronic heart failure with preserved ejection fraction (HFpEF) (HCC)   Long term current use of antiarrhythmic drug  #1 acute on chronic diastolic heart failure, preserved EF -Patient presented with palpitation, worsening shortness of breath and dyspnea consistent with CHF exacerbation. - Patient being followed in consultation by cardiology as patient with soft/low blood pressure. - Current goal-directed medical therapy limited by patient's hypotension. - Patient status postthoracentesis on the right 05/01/2024, left thoracentesis on hold given improvement in the effusion. - Oxygen weaned to 4 L nasal cannula with goal sats of 88 to 91% at this point in time. - Repeat CT chest done with bilateral ongoing effusions. - Lights criteria not met although pleural/serum LDH ratio 0.58 with cutoff of 0.6 consistent with CHF exacerbation. - Ongoing tachycardia. - Was  on IV amiodarone  however infiltrated and as such IV amiodarone  discontinued and patient on oral amiodarone . - Repeat 2D echo 12/13 with a EF greater than 75% with hyperdynamic function, indeterminate diastolic parameters, severely dilated left atrial size, mildly dilated right atrial size, moderate pleural effusion in the left lateral region.  Mild to moderate MVR.  No evidence of mitral stenosis.  Mild aortic valve stenosis. - Patient was on IV Lasix  and dose decreased per cardiology patient was on Lasix  40 mg IV every 12 hours and subsequently to Lasix  40 mg IV daily. -Urine output of 650 cc recorded over the past 24 hours.  Doubt accuracy. -Patient is -7.4 L during this hospitalization. - Current weight of 150.57 pounds from 164.3 pounds (05/06/2024) from 169 pounds from as high as 171 pounds on 05/03/2024. -IV Lasix  dose decreased to 40 mg daily per cardiology. - Per cardiology.  2.  Acute hypoxic respiratory failure - Secondary to problem #1. - Chest x-ray with bilateral pleural effusions, patient on diuretics. - O2 supplementation initially 6 L nasal cannula went up as high as 10 L high flow nasal cannula being weaned down as tolerated currently on 4-5 L nasal cannula with sats of 94-96%. - Patient resumed back on IV Lasix  per cardiology and dose being adjusted..  3.  Persistent A-fib -Patient was on IV amiodarone  however due to infiltration has been transitioned to oral amiodarone . - Patient seen in consultation by cardiology and patient underwent TEE DCCV (05/05/2024) however noted per cardiology that patient converted to normal sinus rhythm for several beats before returning back to atrial fibrillation. -Patient noted to be going in and out of A-fib  and currently in A-fib. - Eliquis  for anticoagulation. - Per cardiology.  4.  Hyperlipidemia - Continue statin.   5.  Hypertension -Due to soft/borderline blood pressure metoprolol  discontinued. - Patient on IV Lasix  as noted above  which was discontinued per cardiology on 05/05/2024, IV Lasix  at 40 mg every 12 hours resumed on 05/06/2024 and dose decreased to once daily on 05/07/2024 per cardiology.   6.  Hypothyroidism - Synthroid .   7.  Hypokalemia -Secondary to diuresis. - Potassium at 3.7 this morning. -Patient on IV Lasix  daily. -Will give a dose of potassium 40 mEq p.o. x 1 today. - Magnesium  at 2.2. - Repeat labs in the a.m.  8.  Left upper extremity amiodarone  extravasation/superficial thrombophlebitis -Clinical improvement. - Status post hyalurinidase 12/16. - No significant erythema, warmth or necrosis.  Improved tenderness to palpation. - Supportive care. - Warm compresses as needed.  9.  Pressure injury, POA Wound 04/30/24 1250 Pressure Injury Buttocks Medial;Bilateral Stage 1 -  Intact skin with non-blanchable redness of a localized area usually over a bony prominence. (Active)       DVT prophylaxis: Eliquis  Code Status: Full Family Communication: Updated patient.  No family at bedside. Disposition: TBD  Status is: Inpatient Remains inpatient appropriate because: Severity of illness   Consultants:  Cardiology: Dr. Kriste 04/29/2024 Electrophysiology: Ozell Passey, PA 05/05/2024  Procedures:  2D echo 04/30/2024 TEE cardioversion 05/05/2024-per Dr. Emery converted to normal sinus rhythm for several beats before returning to atrial fibrillation. Right ultrasound-guided thoracentesis per Dr. Laurence 05/01/2024  Antimicrobials:  Anti-infectives (From admission, onward)    None         Subjective: Patient lying in bed.  Denies any significant chest pain.  Still with shortness of breath.  Denies any palpitations.  No abdominal pain.  Tolerating current diet.   Objective: Vitals:   05/07/24 1926 05/07/24 2357 05/08/24 0454 05/08/24 0753  BP: 94/63 (!) 108/54 (!) 97/48 (!) 114/52  Pulse: 90 75 73 (!) 108  Resp: 15 (!) 23 18 17   Temp: 98 F (36.7 C) 98 F (36.7  C) 98.3 F (36.8 C) 98.2 F (36.8 C)  TempSrc: Oral Oral Oral Oral  SpO2: 98% 96% 93% 96%  Weight:   68.3 kg   Height:        Intake/Output Summary (Last 24 hours) at 05/08/2024 0954 Last data filed at 05/07/2024 2100 Gross per 24 hour  Intake 360 ml  Output 650 ml  Net -290 ml   Filed Weights   05/05/24 1017 05/06/24 0535 05/08/24 0454  Weight: 76.7 kg 74.5 kg 68.3 kg    Examination:  General exam: NAD. Respiratory system: Decreased breath sounds in the bases.  Bibasilar crackles.  No wheezing.  Fair air movement.  No use of accessory muscles of respiration.  Speaking in full sentences.   Cardiovascular system: Irregularly irregular.  No significant lower extremity pitting edema.   Gastrointestinal system: Abdomen is soft, nontender, nondistended, positive bowel sounds.  No rebound.  No guarding.  Central nervous system: Alert and oriented. No focal neurological deficits. Extremities: Symmetric 5 x 5 power. Skin: No rashes, lesions or ulcers Psychiatry: Judgement and insight appear normal. Mood & affect appropriate.     Data Reviewed: I have personally reviewed following labs and imaging studies  CBC: Recent Labs  Lab 05/03/24 1027 05/06/24 0314 05/07/24 0412  WBC 9.1 9.0 10.1  NEUTROABS 7.5 6.5  --   HGB 13.3 12.4 12.3  HCT 41.0 37.8 38.2  MCV 93.4 94.3 93.4  PLT 219 231 225    Basic Metabolic Panel: Recent Labs  Lab 05/03/24 0509 05/05/24 0231 05/05/24 1933 05/06/24 0314 05/07/24 0412 05/08/24 0838  NA 136 139 139 138 139 139  K 3.2* 2.9* 3.8 3.2* 3.1* 3.7  CL 91* 91* 93* 93* 95* 96*  CO2 38* 40* 38* 37* 35* 35*  GLUCOSE 112* 102* 188* 93 100* 210*  BUN 16 15 16 17 18 21   CREATININE 1.15* 1.10* 1.18* 1.10* 1.15* 1.23*  CALCIUM  8.0* 8.6* 8.9 8.6* 8.6* 9.0  MG 2.0  --  1.9 1.8 2.2 2.0    GFR: Estimated Creatinine Clearance: 31 mL/min (A) (by C-G formula based on SCr of 1.23 mg/dL (H)).  Liver Function Tests: Recent Labs  Lab 05/01/24 1745   PROT 6.7  ALBUMIN 2.5*    CBG: No results for input(s): GLUCAP in the last 168 hours.   Recent Results (from the past 240 hours)  MRSA Next Gen by PCR, Nasal     Status: None   Collection Time: 04/30/24 12:50 PM   Specimen: Nasal Mucosa; Nasal Swab  Result Value Ref Range Status   MRSA by PCR Next Gen NOT DETECTED NOT DETECTED Final    Comment: (NOTE) The GeneXpert MRSA Assay (FDA approved for NASAL specimens only), is one component of a comprehensive MRSA colonization surveillance program. It is not intended to diagnose MRSA infection nor to guide or monitor treatment for MRSA infections. Test performance is not FDA approved in patients less than 24 years old. Performed at [facility], [address] [place], [place] [address]   Pleural fluid culture w Gram Stain     Status: None   Collection Time: 05/01/24  2:04 PM   Specimen: Pleural Fluid  Result Value Ref Range Status   Specimen Description PLEURAL  Final   Special Requests NONE  Final   Gram Stain NO WBC SEEN NO ORGANISMS SEEN   Final   Culture   Final    NO GROWTH 3 DAYS Performed at [facility], [address] [place], [place] [address]    Report Status 05/04/2024 FINAL  Final  Resp panel by RT-PCR (RSV, Flu A&B, Covid) Anterior Nasal Swab     Status: None   Collection Time: 05/05/24  6:43 PM   Specimen: Anterior Nasal Swab  Result Value Ref Range Status   SARS Coronavirus 2 by RT PCR NEGATIVE NEGATIVE Final   Influenza A by PCR NEGATIVE NEGATIVE Final   Influenza B by PCR NEGATIVE NEGATIVE Final    Comment: (NOTE) The Xpert Xpress SARS-CoV-2/FLU/RSV plus assay is intended as an aid in the diagnosis of influenza from Nasopharyngeal swab specimens and should not be used as a sole basis for treatment. Nasal washings and aspirates are unacceptable for Xpert Xpress SARS-CoV-2/FLU/RSV testing.  Fact Sheet for Patients: bloggercourse.com  Fact Sheet for Healthcare  Providers: seriousbroker.it  This test is not yet approved or cleared by the [place]  FDA and has been authorized for detection and/or diagnosis of SARS-CoV-2 by FDA under an Emergency Use Authorization (EUA). This EUA will remain in effect (meaning this test can be used) for the duration of the COVID-19 declaration under Section 564(b)(1) of the Act, 21 U.S.C. section 360bbb-3(b)(1), unless the authorization is terminated or revoked.     Resp Syncytial Virus by PCR NEGATIVE NEGATIVE Final    Comment: (NOTE) Fact Sheet for Patients: bloggercourse.com  Fact Sheet for Healthcare Providers: seriousbroker.it  This test is not yet approved  or cleared by the [place]  FDA and has been authorized for detection and/or diagnosis of SARS-CoV-2 by FDA under an Emergency Use Authorization (EUA). This EUA will remain in effect (meaning this test can be used) for the duration of the COVID-19 declaration under Section 564(b)(1) of the Act, 21 U.S.C. section 360bbb-3(b)(1), unless the authorization is terminated or revoked.  Performed at [facility], [address], [place], [place] [address]          Radiology Studies: No results found.       Scheduled Meds:  apixaban   5 mg Oral BID   atorvastatin   20 mg Oral Daily   furosemide   40 mg Intravenous Daily   levothyroxine   25 mcg Oral Q0600   metoprolol  tartrate  25 mg Oral TID   Continuous Infusions:  amiodarone      amiodarone         LOS: 9 days    Time spent: 40 minutes    Toribio Hummer, MD [facility]   To contact the attending provider between 7A-7P or the covering provider during after hours 7P-7A, please log into the web site [email or web address] and access using universal [place] password for that web site. If you do not have the password, please call the hospital operator.  05/08/2024, 9:54 AM    "

## 2024-05-08 NOTE — Plan of Care (Signed)

## 2024-05-08 NOTE — Progress Notes (Signed)
 "  Rounding Note   Patient Name: Carla Petersen Date of Encounter: 05/08/2024  [facility] Cardiologist: Lonni LITTIE Nanas, MD   Subjective No events overnight  Remains in Afib with more RVR burden On [place] oxygen No family at bedside.   Scheduled Meds:  amiodarone   200 mg Oral BID   Followed by   [name] ON 05/15/2024] amiodarone   200 mg Oral Daily   apixaban   5 mg Oral BID   atorvastatin   20 mg Oral Daily   furosemide   40 mg Intravenous Daily   levothyroxine   25 mcg Oral Q0600   metoprolol  tartrate  25 mg Oral TID   Continuous Infusions:  PRN Meds: acetaminophen  **OR** acetaminophen , ondansetron  **OR** ondansetron  (ZOFRAN ) IV, mouth rinse   Vital Signs  Vitals:   05/07/24 1926 05/07/24 2357 05/08/24 0454 05/08/24 0753  BP: 94/63 (!) 108/54 (!) 97/48 (!) 114/52  Pulse: 90 75 73 (!) 108  Resp: 15 (!) 23 18 17   Temp: 98 F (36.7 C) 98 F (36.7 C) 98.3 F (36.8 C) 98.2 F (36.8 C)  TempSrc: Oral Oral Oral Oral  SpO2: 98% 96% 93% 96%  Weight:   68.3 kg   Height:        Intake/Output Summary (Last 24 hours) at 05/08/2024 0826 Last data filed at 05/07/2024 2100 Gross per 24 hour  Intake 360 ml  Output 650 ml  Net -290 ml   Net IO Since Admission: -7,448.67 mL [05/08/24 0826]     05/08/2024    4:54 AM 05/06/2024    5:35 AM 05/05/2024   10:17 AM  Last 3 Weights  Weight (lbs) 150 lb 9.2 oz 164 lb 4.8 oz 169 lb  Weight (kg) 68.3 kg 74.526 kg 76.658 kg      Telemetry Atrial fibrillation with rVR > cVR- Personally Reviewed  ECG  No new ECG  Physical Exam  GEN: No acute distress.   Neck: no JVD Cardiac: iRRR, tachycardia, II/VI systolic murmur, no rubs, or gallops.  Respiratory: decreased breath sounds bilateral, no wheezes or rhonchi, rales  GI: Soft, nontender, non-distended  MS: Trace edema bilaterally; warm, no deformity Neuro:  Nonfocal  Psych: Normal affect   Labs High Sensitivity Troponin:   Recent Labs  Lab 04/29/24 1624  04/29/24 1957  TROPONINIHS 7 8   No results for input(s): TRNPT in the last 720 hours.     Chemistry Recent Labs  Lab 05/01/24 1745 05/02/24 0343 05/05/24 1933 05/06/24 0314 05/07/24 0412  NA  --    < > 139 138 139  K  --    < > 3.8 3.2* 3.1*  CL  --    < > 93* 93* 95*  CO2  --    < > 38* 37* 35*  GLUCOSE  --    < > 188* 93 100*  BUN  --    < > 16 17 18   CREATININE  --    < > 1.18* 1.10* 1.15*  CALCIUM   --    < > 8.9 8.6* 8.6*  MG  --    < > 1.9 1.8 2.2  PROT 6.7  --   --   --   --   ALBUMIN 2.5*  --   --   --   --   GFRNONAA  --    < > 45* 49* 46*  ANIONGAP  --    < > 8 7 9    < > = values in this interval not displayed.  Lipids No results for input(s): CHOL, TRIG, HDL, LABVLDL, LDLCALC, CHOLHDL in the last 168 hours.  Hematology Recent Labs  Lab 05/03/24 1027 05/06/24 0314 05/07/24 0412  WBC 9.1 9.0 10.1  RBC 4.39 4.01 4.09  HGB 13.3 12.4 12.3  HCT 41.0 37.8 38.2  MCV 93.4 94.3 93.4  MCH 30.3 30.9 30.1  MCHC 32.4 32.8 32.2  RDW 14.4 14.2 14.3  PLT 219 231 225   Thyroid  No results for input(s): TSH, FREET4 in the last 168 hours.  BNP No results for input(s): BNP, PROBNP in the last 168 hours.   DDimer No results for input(s): DDIMER in the last 168 hours.   Radiology  No results found.   Cardiac Studies TTE 04/30/24:  1. Left ventricular ejection fraction, by estimation, is >75%. The left  ventricle has hyperdynamic function. The left ventricle has no regional  wall motion abnormalities. Left ventricular diastolic parameters are  indeterminate.   2. Right ventricular systolic function is normal. The right ventricular  size is normal. There is mildly elevated pulmonary artery systolic  pressure.   3. Left atrial size was severely dilated.   4. Right atrial size was mildly dilated.   5. Moderate pleural effusion in the left lateral region.   6. The mitral valve is normal in structure. Mild to moderate mitral valve   regurgitation. No evidence of mitral stenosis.   7. The aortic valve is calcified. Aortic valve regurgitation is not  visualized. Mild aortic valve stenosis.   8. The inferior vena cava is dilated in size with >50% respiratory  variability, suggesting right atrial pressure of 8 mmHg.   Patient Profile   Carla Petersen is a 83 y.o. female with a PMH of persistent AF, HFpEF, HLD, Graves' disease who was admitted for acute hypoxic respiratory failure in the setting of acute on chronic HFpEF exacerbation and atrial fibrillation with RVR.  Cardiology was consulted for the evaluation of her cardiovascular issues.  Assessment & Plan  #Acute on Chronic HFpEF Exacerbation #Acute Hypoxic Respiratory Failure - Patient admitted with volume overload in the setting of decompensated heart failure. - Diuresis and GDMT has been challenging due to low blood pressures. - Net IO Since Admission: -7,448.67 mL [05/08/24 0826] - Ultimately the patient remains volume overloaded and does need to get additional fluid removed. - Respiratory viral panel negative Continue Lasix  40 mg IV qday - given her RVR episode volume status may worsen Strict I's and O's, daily weights S/p thoracentesis R 12/14, s/p L thoracentesis 12/15 Incentive spirometry, encouraged  Check chest xray to evaluate effusion and consolidation - as she still requiring oxygen.   #Atrial Fibrillation with rVR > cVR - Patient found to be in atrial fibrillation with RVR on admission. - She underwent unsuccessful DCCV on 12/1 and was discharged at an outside hospital on p.o. Amio. - Was placed on IV Amio gtt; however, she remains in A-fib with RVR. - Repeat TEE/DCCV on 12/18 was unsuccessful; however, the patient later converted to NSR on her own. - EP consulted and agreed with continuing amiodarone  without pursuing advanced treatments at this time. Continue oral amiodarone  load - will talk to pharmacy  Continue Eliquis  5 mg twice daily Maintain K>4,  Mg>2 Maintain telemetry Increased Lopressor  25 mg po tid with holding parameters.  Would like to avoid digoxin if possible due to low potassium and age - but may consider.  TSH pending  Longterm antiarrhythmic medication:  IV amiodarone  started 05/01/2024 - 05/04/2024 IV amiodarone  200 mg  p.o. twice daily started 05/04/2024 TSH pending AST, ALT within normal limits as of April 30, 2024 Last chest x-ray May 03, 2024 Patient is aware that she will need long-term monitoring of side effect profile if amiodarone  used long-term.  #LUE Amiodarone  Extravasation (improving) #Superficial Thrombophlebitis (improving) - LUE forearm  -resolved, back to baseline.   #HLD Continue atorvastatin  20 mg daily  #Hypokalemia: currently on potassium supplements - per primary team.     For questions or updates, please contact [facility] Please consult [email or web address] for contact info under   Since last encounter, I have independently reviewed vital signs, telemetry, I/Os, pertinent labs  . I have changed medications as above.  Prescription drug management.  Coordination of care with pharmacy  Ordered imaging study.   Signed, Charlee Squibb Michele, DO  05/08/2024, 8:26 AM    "

## 2024-05-09 DIAGNOSIS — R059 Cough, unspecified: Secondary | ICD-10-CM

## 2024-05-09 DIAGNOSIS — Z7901 Long term (current) use of anticoagulants: Secondary | ICD-10-CM | POA: Diagnosis not present

## 2024-05-09 DIAGNOSIS — J9601 Acute respiratory failure with hypoxia: Secondary | ICD-10-CM | POA: Diagnosis not present

## 2024-05-09 DIAGNOSIS — Z9289 Personal history of other medical treatment: Secondary | ICD-10-CM | POA: Diagnosis not present

## 2024-05-09 DIAGNOSIS — Z79899 Other long term (current) drug therapy: Secondary | ICD-10-CM | POA: Diagnosis not present

## 2024-05-09 DIAGNOSIS — I5033 Acute on chronic diastolic (congestive) heart failure: Secondary | ICD-10-CM | POA: Diagnosis not present

## 2024-05-09 DIAGNOSIS — I4819 Other persistent atrial fibrillation: Secondary | ICD-10-CM | POA: Diagnosis not present

## 2024-05-09 DIAGNOSIS — E039 Hypothyroidism, unspecified: Secondary | ICD-10-CM | POA: Diagnosis not present

## 2024-05-09 DIAGNOSIS — E782 Mixed hyperlipidemia: Secondary | ICD-10-CM | POA: Diagnosis not present

## 2024-05-09 DIAGNOSIS — I10 Essential (primary) hypertension: Secondary | ICD-10-CM | POA: Diagnosis not present

## 2024-05-09 DIAGNOSIS — E876 Hypokalemia: Secondary | ICD-10-CM | POA: Diagnosis not present

## 2024-05-09 DIAGNOSIS — I4891 Unspecified atrial fibrillation: Secondary | ICD-10-CM | POA: Diagnosis not present

## 2024-05-09 LAB — CBC WITH DIFFERENTIAL/PLATELET
Abs Immature Granulocytes: 0.04 K/uL (ref 0.00–0.07)
Basophils Absolute: 0.1 K/uL (ref 0.0–0.1)
Basophils Relative: 1 %
Eosinophils Absolute: 0.2 K/uL (ref 0.0–0.5)
Eosinophils Relative: 2 %
HCT: 36.8 % (ref 36.0–46.0)
Hemoglobin: 11.8 g/dL — ABNORMAL LOW (ref 12.0–15.0)
Immature Granulocytes: 0 %
Lymphocytes Relative: 14 %
Lymphs Abs: 1.4 K/uL (ref 0.7–4.0)
MCH: 30.1 pg (ref 26.0–34.0)
MCHC: 32.1 g/dL (ref 30.0–36.0)
MCV: 93.9 fL (ref 80.0–100.0)
Monocytes Absolute: 1.1 K/uL — ABNORMAL HIGH (ref 0.1–1.0)
Monocytes Relative: 11 %
Neutro Abs: 7.5 K/uL (ref 1.7–7.7)
Neutrophils Relative %: 72 %
Platelets: 240 K/uL (ref 150–400)
RBC: 3.92 MIL/uL (ref 3.87–5.11)
RDW: 14.2 % (ref 11.5–15.5)
WBC: 10.3 K/uL (ref 4.0–10.5)
nRBC: 0 % (ref 0.0–0.2)

## 2024-05-09 LAB — BASIC METABOLIC PANEL WITH GFR
Anion gap: 7 (ref 5–15)
BUN: 22 mg/dL (ref 8–23)
CO2: 35 mmol/L — ABNORMAL HIGH (ref 22–32)
Calcium: 8.5 mg/dL — ABNORMAL LOW (ref 8.9–10.3)
Chloride: 96 mmol/L — ABNORMAL LOW (ref 98–111)
Creatinine, Ser: 1.27 mg/dL — ABNORMAL HIGH (ref 0.44–1.00)
GFR, Estimated: 41 mL/min — ABNORMAL LOW
Glucose, Bld: 105 mg/dL — ABNORMAL HIGH (ref 70–99)
Potassium: 3.6 mmol/L (ref 3.5–5.1)
Sodium: 139 mmol/L (ref 135–145)

## 2024-05-09 LAB — MAGNESIUM: Magnesium: 2 mg/dL (ref 1.7–2.4)

## 2024-05-09 MED ORDER — PANTOPRAZOLE SODIUM 40 MG PO TBEC
40.0000 mg | DELAYED_RELEASE_TABLET | Freq: Every day | ORAL | Status: DC
  Administered 2024-05-09 – 2024-05-14 (×6): 40 mg via ORAL
  Filled 2024-05-09 (×6): qty 1

## 2024-05-09 MED ORDER — BENZONATATE 100 MG PO CAPS
100.0000 mg | ORAL_CAPSULE | Freq: Three times a day (TID) | ORAL | Status: DC
  Administered 2024-05-09 – 2024-05-14 (×16): 100 mg via ORAL
  Filled 2024-05-09 (×16): qty 1

## 2024-05-09 MED ORDER — LORATADINE 10 MG PO TABS
10.0000 mg | ORAL_TABLET | Freq: Every day | ORAL | Status: DC
  Administered 2024-05-09 – 2024-05-14 (×6): 10 mg via ORAL
  Filled 2024-05-09 (×6): qty 1

## 2024-05-09 MED ORDER — DILTIAZEM HCL 30 MG PO TABS
30.0000 mg | ORAL_TABLET | Freq: Three times a day (TID) | ORAL | Status: DC
  Administered 2024-05-09 – 2024-05-11 (×5): 30 mg via ORAL
  Filled 2024-05-09 (×6): qty 1

## 2024-05-09 MED ORDER — METOPROLOL TARTRATE 25 MG PO TABS
25.0000 mg | ORAL_TABLET | Freq: Two times a day (BID) | ORAL | Status: DC
  Administered 2024-05-10 – 2024-05-11 (×3): 25 mg via ORAL
  Filled 2024-05-09 (×4): qty 1

## 2024-05-09 MED ORDER — POTASSIUM CHLORIDE CRYS ER 20 MEQ PO TBCR
40.0000 meq | EXTENDED_RELEASE_TABLET | Freq: Every day | ORAL | Status: DC
  Administered 2024-05-09 – 2024-05-14 (×6): 40 meq via ORAL
  Filled 2024-05-09 (×6): qty 2

## 2024-05-09 NOTE — Progress Notes (Signed)
 Physical Therapy Treatment Patient Details Name: Carla Petersen MRN: [id number] DOB: [birth year] Today's Date: 05/09/2024   History of Present Illness 83 y.o. female presents [facility] 04/29/24 with heart palpitations. Pt in a-fib/flutter with acute exacerbation of CHF and acute hypoxic respiratory failure. Chest CT showed B effusions. 12/14 s/p L thoracentesis. 12/18 s/p TEE and cardioversion. PMHx: CHF, Graves, HTN    PT Comments  Pt resting in bed on arrival, agreeable to session and demonstrating good progress towards acute goals. Pt demonstrating bed mobility at mod I level, transfers sit<>stand and gait with rollator support with grossly supervision to CGA for safety and line/lead management. Pt without need for seated rest this session and progressing gait ~150' with no overt LOB noted. Pt was educated on continued rollator use to maximize functional independence, safety, and decrease risk for falls. Pt agreeable to time up in chair at end of session and receptive to education on importance of frequent mobility to maximize functional mobility progression. Pt continues to benefit from skilled PT services to progress toward functional mobility goals.    HR 110-122bpm with activity BP 140/73 post ambulation 88-95% SpO2 on 4L with activity   If plan is discharge home, recommend the following: Assist for transportation;Help with stairs or ramp for entrance   Can travel by private vehicle        Equipment Recommendations  None recommended by PT (pt has rollator)    Recommendations for Other Services       Precautions / Restrictions Precautions Precautions: Fall Recall of Precautions/Restrictions: Intact Precaution/Restrictions Comments: watch HR/O2 Restrictions Weight Bearing Restrictions Per Provider Order: No     Mobility  Bed Mobility Overal bed mobility: Modified Independent             General bed mobility comments: increased time and use of bed features     Transfers Overall transfer level: Needs assistance Equipment used: Rollator (4 wheels) Transfers: Sit to/from Stand Sit to Stand: Supervision           General transfer comment: supervision for safety and line management    Ambulation/Gait Ambulation/Gait assistance: Supervision Gait Distance (Feet): 150 Feet Assistive device: Rollator (4 wheels) Gait Pattern/deviations: Step-through pattern, Decreased stride length Gait velocity: decr     General Gait Details: steady gait with no overt LOB. HR 110-122bpm, SpO2 >90% on 4L   Stairs             Wheelchair Mobility     Tilt Bed    Modified Rankin (Stroke Patients Only)       Balance Overall balance assessment: Needs assistance Sitting-balance support: No upper extremity supported, Feet supported Sitting balance-Leahy Scale: Normal     Standing balance support: Bilateral upper extremity supported, During functional activity Standing balance-Leahy Scale: Fair                              Hotel Manager: No apparent difficulties Factors Affecting Communication: Hearing impaired  Cognition Arousal: Alert Behavior During Therapy: WFL for tasks assessed/performed                             Following commands: Intact      Cueing Cueing Techniques: Verbal cues  Exercises      General Comments General comments (skin integrity, edema, etc.): BP 94/64 on arrival,  140/73 post ambulation, RN aware, SpO2 88-95 on 4L with variable pleth  Pertinent Vitals/Pain Pain Assessment Pain Assessment: No/denies pain    Home Living                          Prior Function            PT Goals (current goals can now be found in the care plan section) Acute Rehab PT Goals PT Goal Formulation: With patient Time For Goal Achievement: 05/20/24 Progress towards PT goals: Progressing toward goals    Frequency    Min 2X/week      PT Plan       Co-evaluation              AM-PAC PT 6 Clicks Mobility   Outcome Measure  Help needed turning from your back to your side while in a flat bed without using bedrails?: None Help needed moving from lying on your back to sitting on the side of a flat bed without using bedrails?: None Help needed moving to and from a bed to a chair (including a wheelchair)?: A Little Help needed standing up from a chair using your arms (e.g., wheelchair or bedside chair)?: A Little Help needed to walk in hospital room?: A Little Help needed climbing 3-5 steps with a railing? : A Lot 6 Click Score: 19    End of Session Equipment Utilized During Treatment: Oxygen Activity Tolerance: Patient tolerated treatment well Patient left: in chair;with call bell/phone within reach Nurse Communication: Mobility status;Other (comment) (HR and O2 sats) PT Visit Diagnosis: Other abnormalities of gait and mobility (R26.89);Muscle weakness (generalized) (M62.81)     Time: 8862-8795 PT Time Calculation (min) (ACUTE ONLY): 27 min  Charges:    $Gait Training: 8-22 mins $Therapeutic Activity: 8-22 mins PT General Charges $$ ACUTE PT VISIT: 1 Visit                     Daxton Nydam R. PTA Acute Rehabilitation Services Office: [phone]   Therisa CHRISTELLA Boor 05/09/2024, 12:48 PM

## 2024-05-09 NOTE — Plan of Care (Signed)
  Problem: Health Behavior/Discharge Planning: Goal: Ability to manage health-related needs will improve Outcome: Progressing   Problem: Clinical Measurements: Goal: Respiratory complications will improve Outcome: Progressing   Problem: Activity: Goal: Risk for activity intolerance will decrease Outcome: Progressing   Problem: Pain Managment: Goal: General experience of comfort will improve and/or be controlled Outcome: Progressing   Problem: Safety: Goal: Ability to remain free from injury will improve Outcome: Progressing

## 2024-05-09 NOTE — Plan of Care (Signed)

## 2024-05-09 NOTE — Progress Notes (Signed)
 Occupational Therapy Treatment Patient Details Name: Carla Petersen MRN: [id number] DOB: Jun 29, 1940 Today's Date: 05/09/2024   History of present illness 83 y.o. female presents to [facility] 04/29/24 with heart palpitations. Pt in a-fib/flutter with acute exacerbation of CHF and acute hypoxic respiratory failure. Chest CT showed B effusions. 12/14 s/p L thoracentesis. 12/18 s/p TEE and cardioversion. PMHx: CHF, Graves, HTN   OT comments  Patient continues to work hard, states MD considering pacemaker.  Patient remains at a supervision level for lines, OT will continue efforts, no post acute OT is anticipated.  MD stated permissible HR up to 130 with mobility.        If plan is discharge home, recommend the following:  A little help with bathing/dressing/bathroom;A little help with walking and/or transfers   Equipment Recommendations  None recommended by OT    Recommendations for Other Services      Precautions / Restrictions Precautions Precautions: Fall Recall of Precautions/Restrictions: Intact Precaution/Restrictions Comments: watch HR/O2 Restrictions Weight Bearing Restrictions Per Provider Order: No       Mobility Bed Mobility               General bed mobility comments: Sitting EOB    Transfers Overall transfer level: Needs assistance Equipment used: Rolling walker (2 wheels) Transfers: Sit to/from Stand Sit to Stand: Supervision           General transfer comment: line management     Balance Overall balance assessment: Needs assistance Sitting-balance support: No upper extremity supported, Feet supported Sitting balance-Leahy Scale: Normal     Standing balance support: Reliant on assistive device for balance Standing balance-Leahy Scale: Fair                             ADL either performed or assessed with clinical judgement   ADL       Grooming: Wash/dry hands;Wash/dry face;Oral care;Supervision/safety;Standing;Sitting                Lower Body Dressing: Supervision/safety;Sit to/from stand   Toilet Transfer: Supervision/safety;Rolling walker (2 wheels);Regular Toilet;BSC/3in1                  Extremity/Trunk Assessment Upper Extremity Assessment Upper Extremity Assessment: Overall WFL for tasks assessed   Lower Extremity Assessment Lower Extremity Assessment: Defer to PT evaluation   Cervical / Trunk Assessment Cervical / Trunk Assessment: Normal    Vision Baseline Vision/History: 1 Wears glasses Patient Visual Report: No change from baseline     Perception Perception Perception: Not tested   Praxis Praxis Praxis: Not tested   Communication Communication Communication: No apparent difficulties Factors Affecting Communication: Hearing impaired   Cognition Arousal: Alert Behavior During Therapy: WFL for tasks assessed/performed Cognition: No apparent impairments                               Following commands: Intact        Cueing   Cueing Techniques: Verbal cues  Exercises      Shoulder Instructions       General Comments  HR to 125    Pertinent Vitals/ Pain       Pain Assessment Pain Assessment: No/denies pain  Frequency  Min 2X/week        Progress Toward Goals  OT Goals(current goals can now be found in the care plan section)  Progress towards OT goals: Progressing toward goals  Acute Rehab OT Goals OT Goal Formulation: With patient Time For Goal Achievement: 05/20/24 Potential to Achieve Goals: Good  Plan      Co-evaluation                 AM-PAC OT 6 Clicks Daily Activity     Outcome Measure   Help from another person eating meals?: None Help from another person taking care of personal grooming?: A Little Help from another person toileting, which includes using toliet, bedpan, or urinal?: A Little Help from another person bathing (including  washing, rinsing, drying)?: A Little Help from another person to put on and taking off regular upper body clothing?: None Help from another person to put on and taking off regular lower body clothing?: A Little 6 Click Score: 20    End of Session Equipment Utilized During Treatment: Rolling walker (2 wheels);Oxygen  OT Visit Diagnosis: Unsteadiness on feet (R26.81)   Activity Tolerance Patient tolerated treatment well   Patient Left in bed;with call bell/phone within reach   Nurse Communication Mobility status        Time: 0825-0850 OT Time Calculation (min): 25 min  Charges: OT General Charges $OT Visit: 1 Visit OT Treatments $Self Care/Home Management : 8-22 mins $Therapeutic Activity: 8-22 mins  05/09/2024  RP, OTR/L  Acute Rehabilitation Services  Office:  [phone]   Carla Petersen 05/09/2024, 8:57 AM

## 2024-05-09 NOTE — TOC Progression Note (Signed)
 Transition of Care [facility]) - Progression Note    Patient Details  Name: Carla Petersen MRN: [id number] Date of Birth: 12-05-1940  Transition of [facility] CM/SW Contact  Roxie KANDICE Stain, RN Phone Number: 05/09/2024, 12:12 PM  Clinical Narrative:     Patient is on amiordarone drip and is not  medically ready for discharge.  ICM (Inpatient Care Management) will continue to follow for needs.                    Expected Discharge Plan and Services                                               Social Drivers of Health (SDOH) Interventions SDOH Screenings   Food Insecurity: No Food Insecurity (04/30/2024)  Housing: Low Risk (04/30/2024)  Transportation Needs: No Transportation Needs (04/30/2024)  Utilities: Not At Risk (04/30/2024)  Social Connections: Moderately Isolated (04/30/2024)  Tobacco Use: Medium Risk (05/05/2024)    Readmission Risk Interventions     No data to display

## 2024-05-09 NOTE — Progress Notes (Signed)
 "  Rounding Note   Patient Name: Carla Petersen Date of Encounter: 05/09/2024  [facility] Cardiologist: Carla LITTIE Nanas, MD   Subjective No chest pain. Shortness of breath improving Complains of productive cough Ventricular rate improved on IV amiodarone   Scheduled Meds:  apixaban   5 mg Oral BID   atorvastatin   20 mg Oral Daily   diltiazem   30 mg Oral Q8H   levothyroxine   25 mcg Oral Q0600   metoprolol  tartrate  25 mg Oral BID   potassium chloride   40 mEq Oral Daily   Continuous Infusions:  amiodarone  30 mg/hr (05/09/24 0720)   PRN Meds: acetaminophen  **OR** acetaminophen , ondansetron  **OR** ondansetron  (ZOFRAN ) IV, mouth rinse   Vital Signs  Vitals:   05/09/24 0000 05/09/24 0327 05/09/24 0331 05/09/24 0819  BP: (!) 97/46 106/64  (!) 91/49  Pulse: 72 99  (!) 104  Resp: 18 16  18   Temp: 98.3 F (36.8 C) 98.3 F (36.8 C)  98.2 F (36.8 C)  TempSrc: Oral Oral  Oral  SpO2: 93% 96%  95%  Weight:   67.1 kg   Height:       No intake or output data in the 24 hours ending 05/09/24 0828  Net IO Since Admission: -7,448.67 mL [05/09/24 0828]     05/09/2024    3:31 AM 05/08/2024    4:54 AM 05/06/2024    5:35 AM  Last 3 Weights  Weight (lbs) 147 lb 14.9 oz 150 lb 9.2 oz 164 lb 4.8 oz  Weight (kg) 67.1 kg 68.3 kg 74.526 kg      Telemetry Atrial fibrillation with  cVR- Personally Reviewed  ECG  No new ECG  Physical Exam  GEN: No acute distress.   Neck: no JVD Cardiac: iRRR, II/VI systolic murmur, no rubs, or gallops.  Respiratory: decreased breath sounds bilateral, positive rhonchi, no wheezes, rales  GI: Soft, nontender, non-distended  MS: Trace edema bilaterally; warm, no deformity Neuro:  Nonfocal  Psych: Normal affect   Labs High Sensitivity Troponin:   Recent Labs  Lab 04/29/24 1624 04/29/24 1957  TROPONINIHS 7 8   No results for input(s): TRNPT in the last 720 hours.     Chemistry Recent Labs  Lab 05/07/24 0412  05/08/24 0838 05/09/24 0220  NA 139 139 139  K 3.1* 3.7 3.6  CL 95* 96* 96*  CO2 35* 35* 35*  GLUCOSE 100* 210* 105*  BUN 18 21 22   CREATININE 1.15* 1.23* 1.27*  CALCIUM  8.6* 9.0 8.5*  MG 2.2 2.0 2.0  GFRNONAA 46* 43* 41*  ANIONGAP 9 8 7     Lipids No results for input(s): CHOL, TRIG, HDL, LABVLDL, LDLCALC, CHOLHDL in the last 168 hours.  Hematology Recent Labs  Lab 05/06/24 0314 05/07/24 0412 05/09/24 0220  WBC 9.0 10.1 10.3  RBC 4.01 4.09 3.92  HGB 12.4 12.3 11.8*  HCT 37.8 38.2 36.8  MCV 94.3 93.4 93.9  MCH 30.9 30.1 30.1  MCHC 32.8 32.2 32.1  RDW 14.2 14.3 14.2  PLT 231 225 240   Thyroid   Recent Labs  Lab 05/08/24 0838  TSH 5.090*    BNP No results for input(s): BNP, PROBNP in the last 168 hours.   DDimer No results for input(s): DDIMER in the last 168 hours.   Radiology  DG Chest 2 View Result Date: 05/08/2024 CLINICAL DATA:  Shortness of breath.  CHF. EXAM: CHEST - 2 VIEW COMPARISON:  None Available. FINDINGS: Mild cardiomegaly with mild central vascular congestion. Small bilateral pleural  effusions and bibasilar atelectasis. No pneumothorax. No acute osseous pathology. IMPRESSION: 1. Mild cardiomegaly with mild central vascular congestion. 2. Small bilateral pleural effusions and bibasilar atelectasis. Electronically Signed   By: Vanetta Chou M.D.   On: 05/08/2024 12:51     Cardiac Studies TTE 04/30/24:  1. Left ventricular ejection fraction, by estimation, is >75%. The left  ventricle has hyperdynamic function. The left ventricle has no regional  wall motion abnormalities. Left ventricular diastolic parameters are  indeterminate.   2. Right ventricular systolic function is normal. The right ventricular  size is normal. There is mildly elevated pulmonary artery systolic  pressure.   3. Left atrial size was severely dilated.   4. Right atrial size was mildly dilated.   5. Moderate pleural effusion in the left lateral region.    6. The mitral valve is normal in structure. Mild to moderate mitral valve  regurgitation. No evidence of mitral stenosis.   7. The aortic valve is calcified. Aortic valve regurgitation is not  visualized. Mild aortic valve stenosis.   8. The inferior vena cava is dilated in size with >50% respiratory  variability, suggesting right atrial pressure of 8 mmHg.   Patient Profile   Carla Petersen is a 83 y.o. female with a PMH of persistent AF, HFpEF, HLD, Graves' disease who was admitted for acute hypoxic respiratory failure in the setting of acute on chronic HFpEF exacerbation and atrial fibrillation with RVR.  Cardiology was consulted for the evaluation of her cardiovascular issues.  Assessment & Plan  #Acute on Chronic HFpEF Exacerbation #Acute Hypoxic Respiratory Failure - Patient admitted with volume overload in the setting of decompensated heart failure. - Diuresis and GDMT has been challenging due to low blood pressures. - Net IO Since Admission: -7,448.67 mL [05/09/24 0828] Respiratory viral panel negative Currently on Lasix  40 mg IV push daily, will hold off IV diuretics today for further up titration of AV nodal agents Strict I's and O's, daily weights S/p thoracentesis R 12/14, s/p L thoracentesis 12/15 Incentive spirometry, encouraged  CXR 05/08/2024 reviewed  Spoke to RN to reach out to primary to see if we need to culture her sputum, evaluate for PNA, or breathing treatments. Of note no fevers or white count.   #Atrial Fibrillation with cVR - Patient found to be in atrial fibrillation with RVR on admission. - She underwent unsuccessful DCCV on 12/1 and was discharged at an outside hospital on p.o. Amio. - Was placed on IV Amio gtt; however, she remains in A-fib with RVR. - Repeat TEE/DCCV on 12/18 was unsuccessful; however, the patient later converted to NSR on her own. - EP consulted and agreed with continuing amiodarone  without pursuing advanced treatments at this time. Continue  IV amio gtt for another 24hrs  Continue Eliquis  5 mg twice daily Maintain K>4, Mg>2 Maintain telemetry Decreased Lopressor  25 mg po tid to bid with holding parameters.  Added Cardizem  30 mg po q8hrs with holding parameters.  Would like to avoid digoxin if possible due to low potassium and age - but may consider.  TSH reviewed   Longterm antiarrhythmic medication:  IV amiodarone  started 05/01/2024 - 05/04/2024 Change to po amiodarone  twice daily started 05/04/2024 - 05/08/2024 Restarted IV amiodarone  on 05/09/2024 TSH 05/08/2024 AST, ALT within normal limits as of April 30, 2024 Last chest x-ray May 03, 2024 Patient is aware that she will need long-term monitoring of side effect profile if amiodarone  used long-term.  #LUE Amiodarone  Extravasation (improving) #Superficial Thrombophlebitis (improving) - LUE forearm  -  resolved, back to baseline.   #HLD Continue atorvastatin  20 mg daily  #Hypokalemia: currently on potassium supplements - per primary team.     For questions or updates, please contact [facility] Please consult [email or web address] for contact info under   Since last encounter, I have independently reviewed vital signs, telemetry, I/Os, pertinent labs  . I have changed medications as above.  Prescription drug management.  Coordination of care with pharmacy  Ordered imaging study.   Signed, Purcell Jungbluth Michele, DO  05/09/2024, 8:28 AM    "

## 2024-05-09 NOTE — Progress Notes (Signed)
 " PROGRESS NOTE    Carla Petersen  FMW:[id number] DOB: 10-Sep-1940 DOA: 04/29/2024 PCP: Ransom Other, MD    Chief Complaint  Patient presents with   Shortness of Breath   Leg Swelling    Brief Narrative:  Carla Petersen is a 83 y.o. female with medical history significant of CHF, Graves who presented to the emergency department due to heart palpitations -noted to be in A-fib/flutter with worsening shortness of breath orthopnea and dyspnea with exertion consistent with heart failure exacerbation.  Cardiology called in consult, hospitalist called to admit.    Assessment & Plan:   Principal Problem:   Acute exacerbation of CHF (congestive heart failure) (HCC) Active Problems:   Acute on chronic diastolic heart failure (HCC)   Acute respiratory failure with hypoxia (HCC)   Atrial fibrillation with rapid ventricular response (HCC)   Prolonged QT interval   Hypokalemia   Hypothyroidism   Hypertension   Pressure injury of skin   Persistent atrial fibrillation (HCC)   History of cardioversion   Mixed hyperlipidemia   Acute on chronic heart failure with preserved ejection fraction (HFpEF) (HCC)   Long term current use of antiarrhythmic drug   Long term (current) use of anticoagulants   Cough  #1 acute on chronic diastolic heart failure, preserved EF -Patient presented with palpitation, worsening shortness of breath and dyspnea consistent with CHF exacerbation. - Patient being followed in consultation by cardiology as patient with soft/low blood pressure. - Current goal-directed medical therapy limited by patient's hypotension. - Patient status postthoracentesis on the right 05/01/2024, left thoracentesis on hold given improvement in the effusion. - Oxygen weaned to 4 L nasal cannula with goal sats of 88 to 91% at this point in time. - Repeat CT chest done with bilateral ongoing effusions. - Lights criteria not met although pleural/serum LDH ratio 0.58 with cutoff of 0.6 consistent  with CHF exacerbation. - Ongoing tachycardia. - Was on IV amiodarone  however infiltrated and as such IV amiodarone  discontinued and patient on oral amiodarone . -Patient subsequently started back on IV amiodarone  on 05/08/2024 per cardiology. - Repeat 2D echo 12/13 with a EF greater than 75% with hyperdynamic function, indeterminate diastolic parameters, severely dilated left atrial size, mildly dilated right atrial size, moderate pleural effusion in the left lateral region.  Mild to moderate MVR.  No evidence of mitral stenosis.  Mild aortic valve stenosis. - Patient was on IV Lasix  and dose decreased per cardiology patient was on Lasix  40 mg IV every 12 hours and subsequently to Lasix  40 mg IV daily. -Urine output not recorded over the past 24 hours.  -Patient is -7.2 L during this hospitalization. - Current weight of 147.93 pounds from 150.57 pounds from 164.3 pounds (05/06/2024) from 169 pounds from as high as 171 pounds on 05/03/2024. -IV Lasix  dose decreased to 40 mg daily per cardiology. - Per cardiology.  2.  Acute hypoxic respiratory failure - Secondary to problem #1. - Chest x-ray with bilateral pleural effusions, patient on diuretics. - O2 supplementation initially 6 L nasal cannula went up as high as 10 L high flow nasal cannula being weaned down as tolerated currently on 4 L nasal cannula with sats of 93 %. - Patient resumed back on IV Lasix  per cardiology and dose being adjusted..  3.  Persistent A-fib -Patient was on IV amiodarone  however due to infiltration has been transitioned to oral amiodarone . -Patient placed back on IV amiodarone  per cardiology on 05/08/2024. - Patient seen in consultation by cardiology and patient underwent TEE  DCCV (05/05/2024) however noted per cardiology that patient converted to normal sinus rhythm for several beats before returning back to atrial fibrillation. -Patient noted to be going in and out of A-fib and currently in A-fib. - Eliquis  for  anticoagulation. - Per cardiology.  4.  Cough -Patient with complaints of a worsening nonproductive cough over the past 24 hours. -Likely secondary to CHF. - Patient afebrile, normal white count. - Chest x-ray done on 05/08/2024 with mild cardiomegaly with mild central vascular congestion, small bilateral pleural effusions and bibasilar atelectasis. - Will place on Claritin , Tessalon  Perles, PPI. -Continue incentive spirometry. - Supportive care.  5.  Hyperlipidemia - Statin.    6.  Hypertension -Due to soft/borderline blood pressure metoprolol  discontinued. - Patient on IV Lasix  as noted above which was discontinued per cardiology on 05/05/2024, IV Lasix  at 40 mg every 12 hours resumed on 05/06/2024 and dose decreased to once daily on 05/07/2024 per cardiology.   7.  Hypothyroidism - Continue Synthroid .    8.  Hypokalemia -Secondary to diuresis. - Potassium at 3.6 this morning. -Patient on IV Lasix  daily. - Place on daily potassium supplementation of 40 mEq. - Magnesium  at 2. - Repeat labs in the a.m.  9.  Left upper extremity amiodarone  extravasation/superficial thrombophlebitis -Clinical improvement. - Status post hyalurinidase 12/16. - No significant erythema, warmth or necrosis.  Improved tenderness to palpation. - Supportive care. - Warm compresses as needed.  10.  Pressure injury, POA Wound 04/30/24 1250 Pressure Injury Buttocks Medial;Bilateral Stage 1 -  Intact skin with non-blanchable redness of a localized area usually over a bony prominence. (Active)       DVT prophylaxis: Eliquis  Code Status: Full Family Communication: Updated patient.  No family at bedside. Disposition: TBD  Status is: Inpatient Remains inpatient appropriate because: Severity of illness   Consultants:  Cardiology: Dr. Kriste 04/29/2024 Electrophysiology: Ozell Passey, PA 05/05/2024  Procedures:  2D echo 04/30/2024 TEE cardioversion 05/05/2024-per Dr. Emery  converted to normal sinus rhythm for several beats before returning to atrial fibrillation. Right ultrasound-guided thoracentesis per Dr. Laurence 05/01/2024  Antimicrobials:  Anti-infectives (From admission, onward)    None         Subjective: Patient sitting up in bed.  Denies any significant shortness of breath.  Denies any chest pains.  Patient with complaints of a nonproductive cough which she feels is slowly worsening over the past day.  Patient feels cough is positional.  Objective: Vitals:   05/09/24 0331 05/09/24 0819 05/09/24 0951 05/09/24 0952  BP:  (!) 91/49 (!) 91/49 (!) 91/49  Pulse:  (!) 104 (!) 104   Resp:  18    Temp:  98.2 F (36.8 C)    TempSrc:  Oral    SpO2:  95%    Weight: 67.1 kg     Height:        Intake/Output Summary (Last 24 hours) at 05/09/2024 1030 Last data filed at 05/09/2024 0926 Gross per 24 hour  Intake 240 ml  Output --  Net 240 ml   Filed Weights   05/06/24 0535 05/08/24 0454 05/09/24 0331  Weight: 74.5 kg 68.3 kg 67.1 kg    Examination:  General exam: NAD. Respiratory system: Some decreased breath sounds in the bases.  Some bibasilar crackles.  No wheezing.  No rhonchi.  No coarse breath sounds.  Fair air movement.  No use of accessory muscles of respiration.  Speaking in full sentences.   Cardiovascular system: Irregularly irregular.  No significant pitting lower extremity edema. SABRA  Gastrointestinal system: Abdomen is soft, nontender, nondistended, positive bowel sounds.  No rebound.  No guarding.  Central nervous system: Alert and oriented. No focal neurological deficits. Extremities: Symmetric 5 x 5 power. Skin: No rashes, lesions or ulcers Psychiatry: Judgement and insight appear normal. Mood & affect appropriate.     Data Reviewed: I have personally reviewed following labs and imaging studies  CBC: Recent Labs  Lab 05/03/24 1027 05/06/24 0314 05/07/24 0412 05/09/24 0220  WBC 9.1 9.0 10.1 10.3  NEUTROABS 7.5 6.5  --   7.5  HGB 13.3 12.4 12.3 11.8*  HCT 41.0 37.8 38.2 36.8  MCV 93.4 94.3 93.4 93.9  PLT 219 231 225 240    Basic Metabolic Panel: Recent Labs  Lab 05/05/24 1933 05/06/24 0314 05/07/24 0412 05/08/24 0838 05/09/24 0220  NA 139 138 139 139 139  K 3.8 3.2* 3.1* 3.7 3.6  CL 93* 93* 95* 96* 96*  CO2 38* 37* 35* 35* 35*  GLUCOSE 188* 93 100* 210* 105*  BUN 16 17 18 21 22   CREATININE 1.18* 1.10* 1.15* 1.23* 1.27*  CALCIUM  8.9 8.6* 8.6* 9.0 8.5*  MG 1.9 1.8 2.2 2.0 2.0    GFR: Estimated Creatinine Clearance: 29.8 mL/min (A) (by C-G formula based on SCr of 1.27 mg/dL (H)).  Liver Function Tests: No results for input(s): AST, ALT, ALKPHOS, BILITOT, PROT, ALBUMIN in the last 168 hours.   CBG: No results for input(s): GLUCAP in the last 168 hours.   Recent Results (from the past 240 hours)  MRSA Next Gen by PCR, Nasal     Status: None   Collection Time: 04/30/24 12:50 PM   Specimen: Nasal Mucosa; Nasal Swab  Result Value Ref Range Status   MRSA by PCR Next Gen NOT DETECTED NOT DETECTED Final    Comment: (NOTE) The GeneXpert MRSA Assay (FDA approved for NASAL specimens only), is one component of a comprehensive MRSA colonization surveillance program. It is not intended to diagnose MRSA infection nor to guide or monitor treatment for MRSA infections. Test performance is not FDA approved in patients less than 24 years old. Performed at [facility], [address] [place], [place] [address]   Pleural fluid culture w Gram Stain     Status: None   Collection Time: 05/01/24  2:04 PM   Specimen: Pleural Fluid  Result Value Ref Range Status   Specimen Description PLEURAL  Final   Special Requests NONE  Final   Gram Stain NO WBC SEEN NO ORGANISMS SEEN   Final   Culture   Final    NO GROWTH 3 DAYS Performed at [facility], [address], [place], [place] [address]    Report Status 05/04/2024 FINAL  Final  Resp panel by RT-PCR (RSV, Flu A&B, Covid)  Anterior Nasal Swab     Status: None   Collection Time: 05/05/24  6:43 PM   Specimen: Anterior Nasal Swab  Result Value Ref Range Status   SARS Coronavirus 2 by RT PCR NEGATIVE NEGATIVE Final   Influenza A by PCR NEGATIVE NEGATIVE Final   Influenza B by PCR NEGATIVE NEGATIVE Final    Comment: (NOTE) The Xpert Xpress SARS-CoV-2/FLU/RSV plus assay is intended as an aid in the diagnosis of influenza from Nasopharyngeal swab specimens and should not be used as a sole basis for treatment. Nasal washings and aspirates are unacceptable for Xpert Xpress SARS-CoV-2/FLU/RSV testing.  Fact Sheet for Patients: bloggercourse.com  Fact Sheet for Healthcare Providers: seriousbroker.it  This test  is not yet approved or cleared by the [place]  FDA and has been authorized for detection and/or diagnosis of SARS-CoV-2 by FDA under an Emergency Use Authorization (EUA). This EUA will remain in effect (meaning this test can be used) for the duration of the COVID-19 declaration under Section 564(b)(1) of the Act, 21 U.S.C. section 360bbb-3(b)(1), unless the authorization is terminated or revoked.     Resp Syncytial Virus by PCR NEGATIVE NEGATIVE Final    Comment: (NOTE) Fact Sheet for Patients: bloggercourse.com  Fact Sheet for Healthcare Providers: seriousbroker.it  This test is not yet approved or cleared by the [place]  FDA and has been authorized for detection and/or diagnosis of SARS-CoV-2 by FDA under an Emergency Use Authorization (EUA). This EUA will remain in effect (meaning this test can be used) for the duration of the COVID-19 declaration under Section 564(b)(1) of the Act, 21 U.S.C. section 360bbb-3(b)(1), unless the authorization is terminated or revoked.  Performed at [facility], [address] [place], [place] [address]          Radiology Studies: DG  Chest 2 View Result Date: 05/08/2024 CLINICAL DATA:  Shortness of breath.  CHF. EXAM: CHEST - 2 VIEW COMPARISON:  None Available. FINDINGS: Mild cardiomegaly with mild central vascular congestion. Small bilateral pleural effusions and bibasilar atelectasis. No pneumothorax. No acute osseous pathology. IMPRESSION: 1. Mild cardiomegaly with mild central vascular congestion. 2. Small bilateral pleural effusions and bibasilar atelectasis. Electronically Signed   By: Vanetta Chou M.D.   On: 05/08/2024 12:51         Scheduled Meds:  apixaban   5 mg Oral BID   atorvastatin   20 mg Oral Daily   benzonatate   100 mg Oral TID   diltiazem   30 mg Oral Q8H   levothyroxine   25 mcg Oral Q0600   loratadine   10 mg Oral Daily   metoprolol  tartrate  25 mg Oral BID   pantoprazole   40 mg Oral Q0600   potassium chloride   40 mEq Oral Daily   Continuous Infusions:  amiodarone  30 mg/hr (05/09/24 0720)      LOS: 10 days    Time spent: 40 minutes    Toribio Hummer, MD [facility]   To contact the attending provider between 7A-7P or the covering provider during after hours 7P-7A, please log into the web site [email or web address] and access using universal [place] password for that web site. If you do not have the password, please call the hospital operator.  05/09/2024, 10:30 AM    "

## 2024-05-10 DIAGNOSIS — Z7901 Long term (current) use of anticoagulants: Secondary | ICD-10-CM | POA: Diagnosis not present

## 2024-05-10 DIAGNOSIS — E782 Mixed hyperlipidemia: Secondary | ICD-10-CM | POA: Diagnosis not present

## 2024-05-10 DIAGNOSIS — Z9289 Personal history of other medical treatment: Secondary | ICD-10-CM | POA: Diagnosis not present

## 2024-05-10 DIAGNOSIS — E039 Hypothyroidism, unspecified: Secondary | ICD-10-CM | POA: Diagnosis not present

## 2024-05-10 DIAGNOSIS — I4891 Unspecified atrial fibrillation: Secondary | ICD-10-CM | POA: Diagnosis not present

## 2024-05-10 DIAGNOSIS — J9601 Acute respiratory failure with hypoxia: Secondary | ICD-10-CM | POA: Diagnosis not present

## 2024-05-10 DIAGNOSIS — Z79899 Other long term (current) drug therapy: Secondary | ICD-10-CM | POA: Diagnosis not present

## 2024-05-10 DIAGNOSIS — I5033 Acute on chronic diastolic (congestive) heart failure: Secondary | ICD-10-CM | POA: Diagnosis not present

## 2024-05-10 LAB — CBC
HCT: 37.3 % (ref 36.0–46.0)
Hemoglobin: 12 g/dL (ref 12.0–15.0)
MCH: 30.2 pg (ref 26.0–34.0)
MCHC: 32.2 g/dL (ref 30.0–36.0)
MCV: 94 fL (ref 80.0–100.0)
Platelets: 221 K/uL (ref 150–400)
RBC: 3.97 MIL/uL (ref 3.87–5.11)
RDW: 14.1 % (ref 11.5–15.5)
WBC: 10.2 K/uL (ref 4.0–10.5)
nRBC: 0 % (ref 0.0–0.2)

## 2024-05-10 LAB — BASIC METABOLIC PANEL WITH GFR
Anion gap: 5 (ref 5–15)
BUN: 19 mg/dL (ref 8–23)
CO2: 38 mmol/L — ABNORMAL HIGH (ref 22–32)
Calcium: 8.9 mg/dL (ref 8.9–10.3)
Chloride: 96 mmol/L — ABNORMAL LOW (ref 98–111)
Creatinine, Ser: 1.13 mg/dL — ABNORMAL HIGH (ref 0.44–1.00)
GFR, Estimated: 47 mL/min — ABNORMAL LOW
Glucose, Bld: 109 mg/dL — ABNORMAL HIGH (ref 70–99)
Potassium: 4 mmol/L (ref 3.5–5.1)
Sodium: 139 mmol/L (ref 135–145)

## 2024-05-10 LAB — MAGNESIUM: Magnesium: 2.1 mg/dL (ref 1.7–2.4)

## 2024-05-10 MED ORDER — NAPHAZOLINE-GLYCERIN 0.012-0.25 % OP SOLN: 1.0000 [drp] | Freq: Four times a day (QID) | OPHTHALMIC | Status: DC | PRN

## 2024-05-10 MED ORDER — AMIODARONE HCL 200 MG PO TABS
200.0000 mg | ORAL_TABLET | Freq: Every day | ORAL | Status: DC
  Administered 2024-05-10 – 2024-05-14 (×5): 200 mg via ORAL
  Filled 2024-05-10 (×5): qty 1

## 2024-05-10 MED ORDER — MIDODRINE HCL 5 MG PO TABS
5.0000 mg | ORAL_TABLET | Freq: Three times a day (TID) | ORAL | Status: DC
  Administered 2024-05-10 – 2024-05-14 (×13): 5 mg via ORAL
  Filled 2024-05-10 (×13): qty 1

## 2024-05-10 MED ORDER — DOXYCYCLINE HYCLATE 100 MG PO TABS
100.0000 mg | ORAL_TABLET | Freq: Two times a day (BID) | ORAL | Status: DC
  Administered 2024-05-10 – 2024-05-14 (×9): 100 mg via ORAL
  Filled 2024-05-10 (×9): qty 1

## 2024-05-10 NOTE — Progress Notes (Signed)
 " PROGRESS NOTE    Carla Petersen  FMW:[id number] DOB: 12/21/1940 DOA: 04/29/2024 PCP: Ransom Other, MD    Chief Complaint  Patient presents with   Shortness of Breath   Leg Swelling    Brief Narrative:  Carla Petersen is a 83 y.o. female with medical history significant of CHF, Graves who presented to the emergency department due to heart palpitations -noted to be in A-fib/flutter with worsening shortness of breath orthopnea and dyspnea with exertion consistent with heart failure exacerbation.  Cardiology called in consult, hospitalist called to admit.    Assessment & Plan:   Principal Problem:   Acute exacerbation of CHF (congestive heart failure) (HCC) Active Problems:   Acute on chronic diastolic heart failure (HCC)   Acute respiratory failure with hypoxia (HCC)   Atrial fibrillation with rapid ventricular response (HCC)   Prolonged QT interval   Hypokalemia   Hypothyroidism   Hypertension   Pressure injury of skin   Persistent atrial fibrillation (HCC)   History of cardioversion   Mixed hyperlipidemia   Acute on chronic heart failure with preserved ejection fraction (HFpEF) (HCC)   Long term current use of antiarrhythmic drug   Long term (current) use of anticoagulants   Cough  #1 acute on chronic diastolic heart failure, preserved EF -Patient presented with palpitation, worsening shortness of breath and dyspnea consistent with CHF exacerbation. - Patient being followed in consultation by cardiology as patient with soft/low blood pressure. - Current goal-directed medical therapy limited by patient's hypotension. - Patient status postthoracentesis on the right 05/01/2024, left thoracentesis on hold given improvement in the effusion. - Oxygen weaned to 2.5 L nasal cannula with goal sats of 88 to 91% at this point in time. - Repeat CT chest done with bilateral ongoing effusions. - Lights criteria not met although pleural/serum LDH ratio 0.58 with cutoff of 0.6  consistent with CHF exacerbation. - Ongoing tachycardia. - Was on IV amiodarone  however infiltrated and as such IV amiodarone  discontinued and patient on oral amiodarone  and subsequently placed back on IV amiodarone .. -Patient subsequently started back on IV amiodarone  on 05/08/2024 per cardiology. -Patient being transition to oral amiodarone  today, 05/10/2024 per cardiology. - Repeat 2D echo 12/13 with a EF greater than 75% with hyperdynamic function, indeterminate diastolic parameters, severely dilated left atrial size, mildly dilated right atrial size, moderate pleural effusion in the left lateral region.  Mild to moderate MVR.  No evidence of mitral stenosis.  Mild aortic valve stenosis. - Patient was on IV Lasix  and dose decreased per cardiology patient was on Lasix  40 mg IV every 12 hours and subsequently to Lasix  40 mg IV daily. -Urine output of 550 cc over the past 24 hours however doubt accuracy.  -Patient is -7.7 L during this hospitalization. - Current weight of 165.9 pounds from 147.93 pounds from 150.57 pounds from 164.3 pounds (05/06/2024) from 169 pounds from as high as 171 pounds on 05/03/2024. - IV Lasix  being transition to torsemide  20 mg every other day per cardiology.  - GDMT noted to be limited due to soft BP and A-fib and as such we will start patient on midodrine  5 mg 3 times daily.  Discussed with cardiology.   - Per cardiology.  2.  Acute hypoxic respiratory failure - Secondary to problem #1. - Chest x-ray with bilateral pleural effusions, patient on diuretics. - O2 supplementation initially 6 L nasal cannula went up as high as 10 L high flow nasal cannula being weaned down as tolerated currently on 2.5 L  nasal cannula with sats of 90%. - Patient was on IV Lasix  which has been subsequently transition to torsemide  every other day.   3.  Persistent A-fib -Patient was on IV amiodarone  however due to infiltration was initially transition to oral amiodarone  and subsequently  placed back on IV amiodarone  per cardiology.  -Patient placed back on IV amiodarone  per cardiology on 05/08/2024. - Patient seen in consultation by cardiology and patient underwent TEE DCCV (05/05/2024) however noted per cardiology that patient converted to normal sinus rhythm for several beats before returning back to atrial fibrillation. -Patient noted to be going in and out of A-fib and currently in A-fib. -Patient being transition of IV amiodarone  to oral amiodarone . - Eliquis  for anticoagulation. -Keep potassium approximately 4, magnesium  approximately 2. -Patient started on Lopressor  25 mg twice daily with holding parameters as well as Cardizem  30 mg p.o. every 8 hours with holding parameters. -It is noted that due to soft blood pressure patient only received 1 dose of Cardizem  and no Lopressor  over the past 24 hours. -Patient to be started on midodrine  to aid with soft blood pressure to allow patient to receive GDMT.  Discussed with cardiology - Per cardiology.  4.  Cough/?  Acute bronchitis -Patient with complaints of a worsening nonproductive cough over the past 48 hours. -Likely secondary to CHF. - Patient afebrile, normal white count. - Chest x-ray done on 05/08/2024 with mild cardiomegaly with mild central vascular congestion, small bilateral pleural effusions and bibasilar atelectasis. - Continue Tessalon  Perles, Claritin , PPI.   - Will treat empirically with doxycycline  x 5 days for an acute bronchitis.   - Incentive spirometry.   - Supportive care.   5.  Hyperlipidemia - Continue statin.   6.  Hypertension -Due to soft/borderline blood pressure metoprolol  discontinued. - Patient on IV Lasix  as noted above which was discontinued per cardiology on 05/05/2024, IV Lasix  at 40 mg every 12 hours resumed on 05/06/2024 and dose decreased to once daily on 05/07/2024 per cardiology.  - IV Lasix  has been discontinued patient being placed on torsemide  every other day. - BP noted to be  soft and as such we will start patient on midodrine .  7.  Hypothyroidism - Synthroid .    8.  Hypokalemia -Secondary to diuresis. - Potassium of 4.0 this morning.  - Patient was on IV Lasix  which has been discontinued.   - Will discontinue oral daily potassium supplementation.  - Magnesium  at 2. - Repeat labs in the a.m.  9.  Left upper extremity amiodarone  extravasation/superficial thrombophlebitis -Clinical improvement. - Status post hyalurinidase 12/16. - No significant erythema, warmth or necrosis.  Improved tenderness to palpation. - Supportive care. - Warm compresses as needed.  10.  Pressure injury, POA Wound 04/30/24 1250 Pressure Injury Buttocks Medial;Bilateral Stage 1 -  Intact skin with non-blanchable redness of a localized area usually over a bony prominence. (Active)       DVT prophylaxis: Eliquis  Code Status: Full Family Communication: Updated patient.  No family at bedside. Disposition: Home when clinically improved and cleared by cardiology.    Status is: Inpatient Remains inpatient appropriate because: Severity of illness   Consultants:  Cardiology: Dr. Kriste 04/29/2024 Electrophysiology: Ozell Passey, PA 05/05/2024  Procedures:  2D echo 04/30/2024 TEE cardioversion 05/05/2024-per Dr. Emery converted to normal sinus rhythm for several beats before returning to atrial fibrillation. Right ultrasound-guided thoracentesis per Dr. Laurence 05/01/2024  Antimicrobials:  Anti-infectives (From admission, onward)    None         Subjective:  Patient laying in bed.  Still with cough however states she has clear sputum now.  Denies any significant shortness of breath.  Denies any chest pain.  Currently on 2-1/2 L nasal cannula with O2 of 90%.    Objective: Vitals:   05/10/24 0354 05/10/24 0602 05/10/24 0803 05/10/24 1143  BP: 108/64 (!) 107/56 (!) 106/46 (!) 86/59  Pulse: 98  100 96  Resp: (!) 22  19 19   Temp: 98.2 F (36.8 C)  98.4 F  (36.9 C) 98.2 F (36.8 C)  TempSrc: Oral  Oral Oral  SpO2: 94%  94% 95%  Weight: 75.3 kg     Height:        Intake/Output Summary (Last 24 hours) at 05/10/2024 1152 Last data filed at 05/10/2024 0405 Gross per 24 hour  Intake 240 ml  Output 200 ml  Net 40 ml   Filed Weights   05/08/24 0454 05/09/24 0331 05/10/24 0354  Weight: 68.3 kg 67.1 kg 75.3 kg    Examination:  General exam: NAD. Respiratory system: Decreased breath sounds in the bases.  No significant crackles noted.  No wheezing.  No significant coarse breath sounds.  Fair air movement.  No use of accessory muscles of respiration.  Speaking in full sentences.  Cardiovascular system: Irregularly irregular.  No significant lower extremity pitting edema.   .   Gastrointestinal system: Abdomen is soft, nontender, nondistended, positive bowel sounds.  No rebound.  No guarding.  Central nervous system: Alert and oriented. No focal neurological deficits. Extremities: Symmetric 5 x 5 power. Skin: No rashes, lesions or ulcers Psychiatry: Judgement and insight appear normal. Mood & affect appropriate.     Data Reviewed: I have personally reviewed following labs and imaging studies  CBC: Recent Labs  Lab 05/06/24 0314 05/07/24 0412 05/09/24 0220 05/10/24 0222  WBC 9.0 10.1 10.3 10.2  NEUTROABS 6.5  --  7.5  --   HGB 12.4 12.3 11.8* 12.0  HCT 37.8 38.2 36.8 37.3  MCV 94.3 93.4 93.9 94.0  PLT 231 225 240 221    Basic Metabolic Panel: Recent Labs  Lab 05/06/24 0314 05/07/24 0412 05/08/24 0838 05/09/24 0220 05/10/24 0222  NA 138 139 139 139 139  K 3.2* 3.1* 3.7 3.6 4.0  CL 93* 95* 96* 96* 96*  CO2 37* 35* 35* 35* 38*  GLUCOSE 93 100* 210* 105* 109*  BUN 17 18 21 22 19   CREATININE 1.10* 1.15* 1.23* 1.27* 1.13*  CALCIUM  8.6* 8.6* 9.0 8.5* 8.9  MG 1.8 2.2 2.0 2.0 2.1    GFR: Estimated Creatinine Clearance: 35.4 mL/min (A) (by C-G formula based on SCr of 1.13 mg/dL (H)).  Liver Function Tests: No results  for input(s): AST, ALT, ALKPHOS, BILITOT, PROT, ALBUMIN in the last 168 hours.   CBG: No results for input(s): GLUCAP in the last 168 hours.   Recent Results (from the past 240 hours)  MRSA Next Gen by PCR, Nasal     Status: None   Collection Time: 04/30/24 12:50 PM   Specimen: Nasal Mucosa; Nasal Swab  Result Value Ref Range Status   MRSA by PCR Next Gen NOT DETECTED NOT DETECTED Final    Comment: (NOTE) The GeneXpert MRSA Assay (FDA approved for NASAL specimens only), is one component of a comprehensive MRSA colonization surveillance program. It is not intended to diagnose MRSA infection nor to guide or monitor treatment for MRSA infections. Test performance is not FDA approved in patients less than 38 years old. Performed at [facility], [address] [place], [place] [address]   Pleural fluid culture w Gram Stain     Status: None   Collection Time: 05/01/24  2:04 PM   Specimen: Pleural Fluid  Result Value Ref Range Status   Specimen Description PLEURAL  Final   Special Requests NONE  Final   Gram Stain NO WBC SEEN NO ORGANISMS SEEN   Final   Culture   Final    NO GROWTH 3 DAYS Performed at [facility], [address], [place], [place] [address]    Report Status 05/04/2024 FINAL  Final  Resp panel by RT-PCR (RSV, Flu A&B, Covid) Anterior Nasal Swab     Status: None   Collection Time: 05/05/24  6:43 PM   Specimen: Anterior Nasal Swab  Result Value Ref Range Status   SARS Coronavirus 2 by RT PCR NEGATIVE NEGATIVE Final   Influenza A by PCR NEGATIVE NEGATIVE Final   Influenza B by PCR NEGATIVE NEGATIVE Final    Comment: (NOTE) The Xpert Xpress SARS-CoV-2/FLU/RSV plus assay is intended as an aid in the diagnosis of influenza from Nasopharyngeal swab specimens and should not be used as a sole basis for treatment. Nasal washings and aspirates are unacceptable for Xpert Xpress SARS-CoV-2/FLU/RSV testing.  Fact Sheet for  Patients: bloggercourse.com  Fact Sheet for Healthcare Providers: seriousbroker.it  This test is not yet approved or cleared by the [place]  FDA and has been authorized for detection and/or diagnosis of SARS-CoV-2 by FDA under an Emergency Use Authorization (EUA). This EUA will remain in effect (meaning this test can be used) for the duration of the COVID-19 declaration under Section 564(b)(1) of the Act, 21 U.S.C. section 360bbb-3(b)(1), unless the authorization is terminated or revoked.     Resp Syncytial Virus by PCR NEGATIVE NEGATIVE Final    Comment: (NOTE) Fact Sheet for Patients: bloggercourse.com  Fact Sheet for Healthcare Providers: seriousbroker.it  This test is not yet approved or cleared by the [place]  FDA and has been authorized for detection and/or diagnosis of SARS-CoV-2 by FDA under an Emergency Use Authorization (EUA). This EUA will remain in effect (meaning this test can be used) for the duration of the COVID-19 declaration under Section 564(b)(1) of the Act, 21 U.S.C. section 360bbb-3(b)(1), unless the authorization is terminated or revoked.  Performed at [facility], [address], [place], [place] [address]          Radiology Studies: No results found.        Scheduled Meds:  amiodarone   200 mg Oral Daily   apixaban   5 mg Oral BID   atorvastatin   20 mg Oral Daily   benzonatate   100 mg Oral TID   diltiazem   30 mg Oral Q8H   levothyroxine   25 mcg Oral Q0600   loratadine   10 mg Oral Daily   metoprolol  tartrate  25 mg Oral BID   midodrine   5 mg Oral TID WC   pantoprazole   40 mg Oral Q0600   potassium chloride   40 mEq Oral Daily   Continuous Infusions:      LOS: 11 days    Time spent: 40 minutes    Toribio Hummer, MD [facility]   To contact the attending provider between 7A-7P or the covering  provider during after hours 7P-7A, please log into the web site [email or web address] and access using universal [place] password for that web site. If you do not have the password, please call the hospital operator.  05/10/2024, 11:52 AM    "

## 2024-05-10 NOTE — Progress Notes (Signed)
 Mobility Specialist Progress Note:    05/10/24 1300  Mobility  Activity Ambulated with assistance  Level of Assistance Standby assist, set-up cues, supervision of patient - no hands on  Assistive Device Four wheel walker  Distance Ambulated (ft) 125 ft  Activity Response Tolerated well  Mobility Referral Yes  Mobility visit 1 Mobility  Mobility Specialist Start Time (ACUTE ONLY) 0900  Mobility Specialist Stop Time (ACUTE ONLY) 0913  Mobility Specialist Time Calculation (min) (ACUTE ONLY) 13 min   Pt received in bed agreeable to mobility. Requested to use BSC prior to ambulating in halls. No physical assistance needed during session just contact guard for safety. Ambulated on 4L/min, VSS. Returned to room w/o fault. Left in bed per RN request. Call bell and personal belongings in reach. All needs met.   Thersia Minder Mobility Specialist  Please contact vis Secure Chat or  Rehab Office [phone]

## 2024-05-10 NOTE — Progress Notes (Signed)
 "  Rounding Note   Patient Name: Carla Petersen Date of Encounter: 05/10/2024  [facility] Cardiologist: Lonni LITTIE Nanas, MD   Subjective No chest pain. Shortness of breath improving + productive cough,not worse Remains on [place] oxygen.  Ventricular rate improved on IV amiodarone   Scheduled Meds:  amiodarone   200 mg Oral Daily   apixaban   5 mg Oral BID   atorvastatin   20 mg Oral Daily   benzonatate   100 mg Oral TID   diltiazem   30 mg Oral Q8H   levothyroxine   25 mcg Oral Q0600   loratadine   10 mg Oral Daily   metoprolol  tartrate  25 mg Oral BID   pantoprazole   40 mg Oral Q0600   potassium chloride   40 mEq Oral Daily   Continuous Infusions:   PRN Meds: acetaminophen  **OR** acetaminophen , ondansetron  **OR** ondansetron  (ZOFRAN ) IV, mouth rinse   Vital Signs  Vitals:   05/09/24 2312 05/10/24 0354 05/10/24 0602 05/10/24 0803  BP: 112/74 108/64 (!) 107/56 (!) 106/46  Pulse: 97 98  100  Resp: 19 (!) 22  19  Temp: 98.1 F (36.7 C) 98.2 F (36.8 C)  98.4 F (36.9 C)  TempSrc: Oral Oral  Oral  SpO2: 94% 94%  94%  Weight:  75.3 kg    Height:        Intake/Output Summary (Last 24 hours) at 05/10/2024 0900 Last data filed at 05/10/2024 0405 Gross per 24 hour  Intake 480 ml  Output 200 ml  Net 280 ml    Net IO Since Admission: -7,518.67 mL [05/10/24 0900]     05/10/2024    3:54 AM 05/09/2024    3:31 AM 05/08/2024    4:54 AM  Last 3 Weights  Weight (lbs) 165 lb 14.4 oz 147 lb 14.9 oz 150 lb 9.2 oz  Weight (kg) 75.252 kg 67.1 kg 68.3 kg      Telemetry Atrial fibrillation with  cVR- Personally Reviewed  ECG  No new ECG  Physical Exam  GEN: No acute distress.   Neck: no JVD Cardiac: iRRR, II/VI systolic murmur, no rubs, or gallops.  Respiratory: decreased breath sounds bilateral, positive rhonchi, no wheezes, rales  GI: Soft, nontender, non-distended  MS: Trace edema bilaterally; warm, no deformity Neuro:  Nonfocal  Psych: Normal affect    Labs High Sensitivity Troponin:   Recent Labs  Lab 04/29/24 1624 04/29/24 1957  TROPONINIHS 7 8   No results for input(s): TRNPT in the last 720 hours.     Chemistry Recent Labs  Lab 05/08/24 0838 05/09/24 0220 05/10/24 0222  NA 139 139 139  K 3.7 3.6 4.0  CL 96* 96* 96*  CO2 35* 35* 38*  GLUCOSE 210* 105* 109*  BUN 21 22 19   CREATININE 1.23* 1.27* 1.13*  CALCIUM  9.0 8.5* 8.9  MG 2.0 2.0 2.1  GFRNONAA 43* 41* 47*  ANIONGAP 8 7 5     Lipids No results for input(s): CHOL, TRIG, HDL, LABVLDL, LDLCALC, CHOLHDL in the last 168 hours.  Hematology Recent Labs  Lab 05/07/24 0412 05/09/24 0220 05/10/24 0222  WBC 10.1 10.3 10.2  RBC 4.09 3.92 3.97  HGB 12.3 11.8* 12.0  HCT 38.2 36.8 37.3  MCV 93.4 93.9 94.0  MCH 30.1 30.1 30.2  MCHC 32.2 32.1 32.2  RDW 14.3 14.2 14.1  PLT 225 240 221   Thyroid   Recent Labs  Lab 05/08/24 0838  TSH 5.090*    BNP No results for input(s): BNP, PROBNP in the last 168 hours.  DDimer No results for input(s): DDIMER in the last 168 hours.   Radiology  DG Chest 2 View Result Date: 05/08/2024 CLINICAL DATA:  Shortness of breath.  CHF. EXAM: CHEST - 2 VIEW COMPARISON:  None Available. FINDINGS: Mild cardiomegaly with mild central vascular congestion. Small bilateral pleural effusions and bibasilar atelectasis. No pneumothorax. No acute osseous pathology. IMPRESSION: 1. Mild cardiomegaly with mild central vascular congestion. 2. Small bilateral pleural effusions and bibasilar atelectasis. Electronically Signed   By: Vanetta Chou M.D.   On: 05/08/2024 12:51     Cardiac Studies TTE 04/30/24:  1. Left ventricular ejection fraction, by estimation, is >75%. The left  ventricle has hyperdynamic function. The left ventricle has no regional  wall motion abnormalities. Left ventricular diastolic parameters are  indeterminate.   2. Right ventricular systolic function is normal. The right ventricular  size is normal.  There is mildly elevated pulmonary artery systolic  pressure.   3. Left atrial size was severely dilated.   4. Right atrial size was mildly dilated.   5. Moderate pleural effusion in the left lateral region.   6. The mitral valve is normal in structure. Mild to moderate mitral valve  regurgitation. No evidence of mitral stenosis.   7. The aortic valve is calcified. Aortic valve regurgitation is not  visualized. Mild aortic valve stenosis.   8. The inferior vena cava is dilated in size with >50% respiratory  variability, suggesting right atrial pressure of 8 mmHg.   Patient Profile   Carla Petersen is a 83 y.o. female with a PMH of persistent AF, HFpEF, HLD, Graves' disease who was admitted for acute hypoxic respiratory failure in the setting of acute on chronic HFpEF exacerbation and atrial fibrillation with RVR.  Cardiology was consulted for the evaluation of her cardiovascular issues.  Assessment & Plan  #Acute on Chronic HFpEF Exacerbation #Acute Hypoxic Respiratory Failure - Patient admitted with volume overload in the setting of decompensated heart failure. - Diuresis and GDMT has been challenging due to low blood pressures and AFib. - Net IO Since Admission: -7,518.67 mL [05/10/24 0900] Respiratory viral panel negative Renal function has improved.  Clinical concern is that she may retain volume given her Afib/HFpEF will restart torsemide  20mg  po qOther day.  Strict I's and O's, daily weights S/p thoracentesis R 12/14, s/p L thoracentesis 12/15 Incentive spirometry, encouraged  CXR 05/08/2024 reviewed  GDMT is limited due to soft BP and Afib.   #Atrial Fibrillation with cVR - Patient found to be in atrial fibrillation with RVR on admission. - She underwent unsuccessful DCCV on 12/1 and was discharged at an outside hospital on p.o. Amio. - Was placed on IV Amio gtt; however, she remains in A-fib with RVR. - Repeat TEE/DCCV on 12/18 was unsuccessful; however, the patient later  converted to NSR on her own. - EP consulted and agreed with continuing amiodarone  without pursuing advanced treatments at this time. Will transition her from IV amio gtt to oral - will get EKG to check her QT Continue Eliquis  5 mg twice daily Maintain K>4, Mg>2 Maintain telemetry Continue Lopressor  25 mg po bid with holding parameters.  Continue Cardizem  30 mg po q8hrs with holding parameters.  Due to soft BP she only received one tablet of Cardizem  and no lopressor  last 24hrs. Would like to avoid digoxin if possible due to low potassium and age - but may consider.  TSH reviewed   Longterm antiarrhythmic medication:  IV amiodarone  started 05/01/2024 - 05/04/2024 Change to po amiodarone  twice  daily started 05/04/2024 - 05/08/2024 Restarted IV amiodarone  on 05/09/2024 TSH 05/08/2024 AST, ALT within normal limits as of April 30, 2024 Last chest x-ray May 03, 2024 Patient is aware that she will need long-term monitoring of side effect profile if amiodarone  used long-term.  #LUE Amiodarone  Extravasation (improving) #Superficial Thrombophlebitis (improving) - LUE forearm  -resolved, back to baseline.   #HLD Continue atorvastatin  20 mg daily  #Hypokalemia: currently on potassium supplements - per primary team.     For questions or updates, please contact [facility] Please consult [email or web address] for contact info under   Signed, Lilybeth Vien, DO  05/10/2024, 9:00 AM    "

## 2024-05-11 DIAGNOSIS — E782 Mixed hyperlipidemia: Secondary | ICD-10-CM | POA: Diagnosis not present

## 2024-05-11 DIAGNOSIS — E039 Hypothyroidism, unspecified: Secondary | ICD-10-CM | POA: Diagnosis not present

## 2024-05-11 DIAGNOSIS — I4891 Unspecified atrial fibrillation: Secondary | ICD-10-CM | POA: Diagnosis not present

## 2024-05-11 DIAGNOSIS — J9601 Acute respiratory failure with hypoxia: Secondary | ICD-10-CM | POA: Diagnosis not present

## 2024-05-11 DIAGNOSIS — Z9289 Personal history of other medical treatment: Secondary | ICD-10-CM | POA: Diagnosis not present

## 2024-05-11 DIAGNOSIS — Z7901 Long term (current) use of anticoagulants: Secondary | ICD-10-CM | POA: Diagnosis not present

## 2024-05-11 DIAGNOSIS — I5033 Acute on chronic diastolic (congestive) heart failure: Secondary | ICD-10-CM | POA: Diagnosis not present

## 2024-05-11 DIAGNOSIS — Z79899 Other long term (current) drug therapy: Secondary | ICD-10-CM | POA: Diagnosis not present

## 2024-05-11 LAB — CBC WITH DIFFERENTIAL/PLATELET
Abs Immature Granulocytes: 0.04 K/uL (ref 0.00–0.07)
Basophils Absolute: 0 K/uL (ref 0.0–0.1)
Basophils Relative: 0 %
Eosinophils Absolute: 0.2 K/uL (ref 0.0–0.5)
Eosinophils Relative: 2 %
HCT: 36.6 % (ref 36.0–46.0)
Hemoglobin: 11.7 g/dL — ABNORMAL LOW (ref 12.0–15.0)
Immature Granulocytes: 0 %
Lymphocytes Relative: 16 %
Lymphs Abs: 1.7 K/uL (ref 0.7–4.0)
MCH: 30.1 pg (ref 26.0–34.0)
MCHC: 32 g/dL (ref 30.0–36.0)
MCV: 94.1 fL (ref 80.0–100.0)
Monocytes Absolute: 1.2 K/uL — ABNORMAL HIGH (ref 0.1–1.0)
Monocytes Relative: 11 %
Neutro Abs: 7.5 K/uL (ref 1.7–7.7)
Neutrophils Relative %: 71 %
Platelets: 230 K/uL (ref 150–400)
RBC: 3.89 MIL/uL (ref 3.87–5.11)
RDW: 14.4 % (ref 11.5–15.5)
WBC: 10.6 K/uL — ABNORMAL HIGH (ref 4.0–10.5)
nRBC: 0 % (ref 0.0–0.2)

## 2024-05-11 LAB — BASIC METABOLIC PANEL WITH GFR
Anion gap: 5 (ref 5–15)
BUN: 17 mg/dL (ref 8–23)
CO2: 34 mmol/L — ABNORMAL HIGH (ref 22–32)
Calcium: 8.7 mg/dL — ABNORMAL LOW (ref 8.9–10.3)
Chloride: 97 mmol/L — ABNORMAL LOW (ref 98–111)
Creatinine, Ser: 1.18 mg/dL — ABNORMAL HIGH (ref 0.44–1.00)
GFR, Estimated: 45 mL/min — ABNORMAL LOW
Glucose, Bld: 108 mg/dL — ABNORMAL HIGH (ref 70–99)
Potassium: 3.9 mmol/L (ref 3.5–5.1)
Sodium: 136 mmol/L (ref 135–145)

## 2024-05-11 LAB — MAGNESIUM: Magnesium: 2.1 mg/dL (ref 1.7–2.4)

## 2024-05-11 MED ORDER — DILTIAZEM HCL 60 MG PO TABS
60.0000 mg | ORAL_TABLET | Freq: Three times a day (TID) | ORAL | Status: DC
  Administered 2024-05-11 – 2024-05-12 (×3): 60 mg via ORAL
  Filled 2024-05-11 (×3): qty 1

## 2024-05-11 MED ORDER — TORSEMIDE 20 MG PO TABS
20.0000 mg | ORAL_TABLET | ORAL | Status: DC
  Administered 2024-05-12 – 2024-05-14 (×2): 20 mg via ORAL
  Filled 2024-05-11 (×2): qty 1

## 2024-05-11 NOTE — TOC Initial Note (Signed)
 Transition of Care [facility] - Initial/Assessment Note    Patient Details  Name: Carla Petersen MRN: [id number] Date of Birth: 06/22/1940  Transition of Care [facility]) CM/SW Contact:    Roxie KANDICE Stain, RN Phone Number: 05/11/2024, 3:14 PM  Clinical Narrative:                 Spoke to patient regarding transition needs.  Patient requesting OP rehab at [facility] instead of home health. Referral sent.  Patient has home 02 form [employer], spouse can bring portable tank.  Expected Discharge Plan: OP Rehab Barriers to Discharge: Continued Medical Work up   Patient Goals and CMS Choice Patient states their goals for this hospitalization and ongoing recovery are:: return home CMS Medicare.gov Compare Post Acute Care list provided to:: Patient Choice offered to / list presented to : Patient      Expected Discharge Plan and Services   Discharge Planning Services: CM Consult   Living arrangements for the past 2 months: Single Family Home                                      Prior Living Arrangements/Services Living arrangements for the past 2 months: Single Family Home Lives with:: Spouse Patient language and need for interpreter reviewed:: Yes Do you feel safe going back to the place where you live?: Yes      Need for Family Participation in Patient Care: Yes (Comment) Care giver support system in place?: Yes (comment) Current home services: DME (oxygen, walker) Criminal Activity/Legal Involvement Pertinent to Current Situation/Hospitalization: No - Comment as needed  Activities of Daily Living   ADL Screening (condition at time of admission) Independently performs ADLs?: Yes (appropriate for developmental age) Is the patient deaf or have difficulty hearing?: Yes Does the patient have difficulty seeing, even when wearing glasses/contacts?: No Does the patient have difficulty concentrating, remembering, or making decisions?: No  Permission Sought/Granted          Permission granted to share info w AGENCY: op rehab        Emotional Assessment Appearance:: Appears stated age Attitude/Demeanor/Rapport: Engaged Affect (typically observed): Accepting Orientation: : Oriented to Situation, Oriented to  Time, Oriented to Place, Oriented to Self Alcohol / Substance Use: Not Applicable Psych Involvement: No (comment)  Admission diagnosis:  Shortness of breath [R06.02] Hypoxia [R09.02] Acute exacerbation of CHF (congestive heart failure) (HCC) [I50.9] Acute on chronic diastolic congestive heart failure (HCC) [I50.33] Acute respiratory failure with hypoxia (HCC) [J96.01] Bilateral lower extremity edema [R60.0] Hypervolemia, unspecified hypervolemia type [E87.70] Patient Active Problem List   Diagnosis Date Noted   Long term (current) use of anticoagulants 05/09/2024   Cough 05/09/2024   Long term current use of antiarrhythmic drug 05/08/2024   History of cardioversion 05/07/2024   Mixed hyperlipidemia 05/07/2024   Acute on chronic heart failure with preserved ejection fraction (HFpEF) (HCC) 05/07/2024   Hypokalemia 05/05/2024   Hypothyroidism 05/05/2024   Hypertension 05/05/2024   Pressure injury of skin 05/05/2024   Persistent atrial fibrillation (HCC) 05/05/2024   Prolonged QT interval 05/01/2024   Acute on chronic diastolic heart failure (HCC) 04/30/2024   Acute respiratory failure with hypoxia (HCC) 04/30/2024   Atrial fibrillation with rapid ventricular response (HCC) 04/30/2024   Acute exacerbation of CHF (congestive heart failure) (HCC) 04/29/2024   Anticoagulant long-term use 03/27/2022   Otorrhea, right 03/27/2022   Sleep apnea in adult 12/18/2019   Hernia, inguinal  11/01/2019   Racing heart beat 09/17/2019   Laceration of right thumb without foreign body without damage to nail 06/03/2017   H/O mastoidectomy 08/02/2015   Mastoiditis of right side 08/02/2015   S/P hip replacement 10/03/2014   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   Multiple traumatic injuries 07/17/2014   PCP:  Ransom Other, MD Pharmacy:   [facility] [id number] - [place], [place] - [address] [place] [address] Phone: [phone] Fax: [phone]  [id number] [place], [place] - [address] AT [facility] [address] [place] [address] Phone: [phone] Fax: [phone]     Social Drivers of Health (SDOH) Social History: SDOH Screenings   Food Insecurity: No Food Insecurity (04/30/2024)  Housing: Low Risk (04/30/2024)  Transportation Needs: No Transportation Needs (04/30/2024)  Utilities: Not At Risk (04/30/2024)  Social Connections: Moderately Isolated (04/30/2024)  Tobacco Use: Medium Risk (05/05/2024)   SDOH Interventions:     Readmission Risk Interventions     No data to display

## 2024-05-11 NOTE — Progress Notes (Signed)
 " PROGRESS NOTE    Carla Petersen  FMW:[id number] DOB: 1940/05/26 DOA: 04/29/2024 PCP: Ransom Other, MD  Chief Complaint  Patient presents with   Shortness of Breath   Leg Swelling    Brief Narrative:   Carla Petersen is Carla Petersen 83 y.o. female with medical history significant of CHF, Graves who presented to the emergency department due to heart palpitations -noted to be in Carla Petersen-fib/flutter with worsening shortness of breath orthopnea and dyspnea with exertion consistent with heart failure exacerbation.    Cardiology continuing to follow.  She has persistent hypoxia.  Repeat plain films pending 12/25.    Assessment & Plan:   Principal Problem:   Acute exacerbation of CHF (congestive heart failure) (HCC) Active Problems:   Acute on chronic diastolic heart failure (HCC)   Acute respiratory failure with hypoxia (HCC)   Atrial fibrillation with rapid ventricular response (HCC)   Prolonged QT interval   Hypokalemia   Hypothyroidism   Hypertension   Pressure injury of skin   Persistent atrial fibrillation (HCC)   History of cardioversion   Mixed hyperlipidemia   Acute on chronic heart failure with preserved ejection fraction (HFpEF) (HCC)   Long term current use of antiarrhythmic drug   Long term (current) use of anticoagulants   Cough  #1 acute on chronic diastolic heart failure, preserved EF -Patient presented with palpitation, worsening shortness of breath and dyspnea consistent with CHF exacerbation. - Patient being followed in consultation by cardiology as patient with soft/low blood pressure. - Current goal-directed medical therapy limited by patient's hypotension. - Patient status postthoracentesis on the right 05/01/2024, left thoracentesis on hold given improvement in the effusion. - CT 12/17 with bilateral effusions and lower lobe consolidations - repeat CXR 12/25 - Oxygen weaned to 2.5 L nasal cannula (required 4 L with activity with therapy today) - TEE with EF 60-65%,  severely dilated LA size, moderate pleural effusion in L lateral region - transitioned to every other day torsemide  - appreciate cardiology assistance - every other day torsemide , GDMT limited due to low BP and afib.  Continue midodrine .     2.  Acute hypoxic respiratory failure - Secondary to problem #1. - Chest x-ray with bilateral pleural effusions, patient on diuretics. - continues to require up to 4 L with activity, not on O2 prior to admission - Patient was on IV Lasix  which has been subsequently transition to torsemide  every other day.  - continue to wean O2 as tolerated   # Bilateral Pleural Effusions  Transudative Effusion - follow repeat CXR - s/p R thora - consider need to repeat thora  3.  Persistent Carla Petersen-fib - s/p unsuccessful DCCV on 12/1 at OSH, discharged on PO amio - TEE/DCCV 12/18 unsucessful, but she later converted to NSR on her own - EP recommended continuing amio - appreciate cards recs.  Amiodarone  200 mg daily, eliquis .  Metop d/c'd.  Diltiazem  started, titration per cards.     4.  Cough/?  Acute bronchitis -Patient with complaints of Carla Petersen worsening nonproductive cough over the past 48 hours. -Likely secondary to CHF. - Patient afebrile, mild leukocytosis - Chest x-ray done on 05/08/2024 with mild cardiomegaly with mild central vascular congestion, small bilateral pleural effusions and bibasilar atelectasis. - started on doxycycline  presumptively for bronchitis, will complete course - Incentive spirometry.   - Supportive care.    5.  Hyperlipidemia - Continue statin.    6.  Hypertension -Due to soft/borderline blood pressure metoprolol  discontinued. Now on midodrine  for hypotension On torsemide  every  other day Diltiazem  per cardiology   7.  Hypothyroidism - Synthroid .     8.  Hypokalemia Wnl today   9.  Left upper extremity amiodarone  extravasation/superficial thrombophlebitis - Status post hyalurinidase 12/16. - Supportive care. - Warm compresses  as needed.   10.  Pressure injury, POA Wound 04/30/24 1250 Pressure Injury Buttocks Medial;Bilateral Stage 1 -  Intact skin with non-blanchable redness of Carla Petersen localized area usually over Carla Petersen bony prominence. (Active)       DVT prophylaxis: eliquis  Code Status: full Family Communication: none Disposition:   Status is: Inpatient Remains inpatient appropriate because: need for continued inpatient care   Consultants:  cardiology  Procedures:  TEE 12/18 IMPRESSIONS     1. Left ventricular ejection fraction, by estimation, is 60 to 65%. The  left ventricle has normal function.   2. Right ventricular systolic function is normal. The right ventricular  size is normal.   3. Left atrial size was severely dilated. No left atrial/left atrial  appendage thrombus was detected.   4. Right atrial size was severely dilated.   5. Moderate pleural effusion in the left lateral region.   6. The mitral valve is normal in structure. Mild to moderate mitral valve  regurgitation. No evidence of mitral stenosis.   7. Functional bicuspid . 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 Aortic valve  regurgitation is not visualized. No aortic stenosis is present.   8. 3D performed of the LAA and demonstrates Maximal landing zone 17 mm  with wall distance 17 mm.   Echo 12/13 IMPRESSIONS     1. Left ventricular ejection fraction, by estimation, is >75%. The left  ventricle has hyperdynamic function. The left ventricle has no regional  wall motion abnormalities. Left ventricular diastolic parameters are  indeterminate.   2. Right ventricular systolic function is normal. The right ventricular  size is normal. There is mildly elevated pulmonary artery systolic  pressure.   3. Left atrial size was severely dilated.   4. Right atrial size was mildly dilated.   5. Moderate pleural effusion in the left lateral region.   6. The mitral valve is normal in structure. Mild to moderate mitral valve  regurgitation. No  evidence of mitral stenosis.   7. The aortic valve is calcified. Aortic valve regurgitation is not  visualized. Mild aortic valve stenosis.   8. The inferior vena cava is dilated in size with >50% respiratory  variability, suggesting right atrial pressure of 8 mmHg.  Antimicrobials:  Anti-infectives (From admission, onward)    Start     Dose/Rate Route Frequency Ordered Stop   05/10/24 1245  doxycycline  (VIBRA -TABS) tablet 100 mg        100 mg Oral Every 12 hours 05/10/24 1153 05/15/24 0959       Subjective: No new complaints Still requiring O2 - needed 4 L with exertion with therapy  Objective: Vitals:   05/10/24 2324 05/11/24 0408 05/11/24 0612 05/11/24 0820  BP: (!) 89/51 (!) 101/50  100/60  Pulse: 88 85 (!) 101 94  Resp:  20  20  Temp: 98 F (36.7 C) 97.8 F (36.6 C)  98.3 F (36.8 C)  TempSrc:  Oral  Oral  SpO2: 94% 94% 90% 93%  Weight:   75.5 kg   Height:        Intake/Output Summary (Last 24 hours) at 05/11/2024 1027 Last data filed at 05/10/2024 1247 Gross per 24 hour  Intake --  Output 200 ml  Net -200 ml   [employer]  05/09/24 0331 05/10/24 0354 05/11/24 0612  Weight: 67.1 kg 75.3 kg 75.5 kg    Examination:  General exam: Appears calm and comfortable  Respiratory system: diminished at bases Cardiovascular system: irregularly irregular Gastrointestinal system: Abdomen is nondistended, soft and nontender.  Central nervous system: Alert and oriented. No focal neurological deficits. Extremities: mild LE edema   Data Reviewed: I have personally reviewed following labs and imaging studies  CBC: Recent Labs  Lab 05/06/24 0314 05/07/24 0412 05/09/24 0220 05/10/24 0222 05/11/24 0210  WBC 9.0 10.1 10.3 10.2 10.6*  NEUTROABS 6.5  --  7.5  --  7.5  HGB 12.4 12.3 11.8* 12.0 11.7*  HCT 37.8 38.2 36.8 37.3 36.6  MCV 94.3 93.4 93.9 94.0 94.1  PLT 231 225 240 221 230    Basic Metabolic Panel: Recent Labs  Lab 05/07/24 0412 05/08/24 0838  05/09/24 0220 05/10/24 0222 05/11/24 0210  NA 139 139 139 139 136  K 3.1* 3.7 3.6 4.0 3.9  CL 95* 96* 96* 96* 97*  CO2 35* 35* 35* 38* 34*  GLUCOSE 100* 210* 105* 109* 108*  BUN 18 21 22 19 17   CREATININE 1.15* 1.23* 1.27* 1.13* 1.18*  CALCIUM  8.6* 9.0 8.5* 8.9 8.7*  MG 2.2 2.0 2.0 2.1 2.1    GFR: Estimated Creatinine Clearance: 33.9 mL/min (Carla Petersen) (by C-G formula based on SCr of 1.18 mg/dL (H)).  Liver Function Tests: No results for input(s): AST, ALT, ALKPHOS, BILITOT, PROT, ALBUMIN in the last 168 hours.  CBG: No results for input(s): GLUCAP in the last 168 hours.   Recent Results (from the past 240 hours)  Pleural fluid culture w Gram Stain     Status: None   Collection Time: 05/01/24  2:04 PM   Specimen: Pleural Fluid  Result Value Ref Range Status   Specimen Description PLEURAL  Final   Special Requests NONE  Final   Gram Stain NO WBC SEEN NO ORGANISMS SEEN   Final   Culture   Final    NO GROWTH 3 DAYS Performed at [facility], [address], [place], [place] [address]    Report Status 05/04/2024 FINAL  Final  Resp panel by RT-PCR (RSV, Flu Carla Petersen&B, Covid) Anterior Nasal Swab     Status: None   Collection Time: 05/05/24  6:43 PM   Specimen: Anterior Nasal Swab  Result Value Ref Range Status   SARS Coronavirus 2 by RT PCR NEGATIVE NEGATIVE Final   Influenza Carla Petersen by PCR NEGATIVE NEGATIVE Final   Influenza B by PCR NEGATIVE NEGATIVE Final    Comment: (NOTE) The Xpert Xpress SARS-CoV-2/FLU/RSV plus assay is intended as an aid in the diagnosis of influenza from Nasopharyngeal swab specimens and should not be used as Carla Petersen sole basis for treatment. Nasal washings and aspirates are unacceptable for Xpert Xpress SARS-CoV-2/FLU/RSV testing.  Fact Sheet for Patients: bloggercourse.com  Fact Sheet for Healthcare Providers: seriousbroker.it  This test is not yet approved or cleared by the [place]  FDA  and has been authorized for detection and/or diagnosis of SARS-CoV-2 by FDA under an Emergency Use Authorization (EUA). This EUA will remain in effect (meaning this test can be used) for the duration of the COVID-19 declaration under Section 564(b)(1) of the Act, 21 U.S.C. section 360bbb-3(b)(1), unless the authorization is terminated or revoked.     Resp Syncytial Virus by PCR NEGATIVE NEGATIVE Final    Comment: (NOTE) Fact Sheet for Patients: bloggercourse.com  Fact Sheet for Healthcare Providers: seriousbroker.it  This test is  not yet approved or cleared by the [place]  FDA and has been authorized for detection and/or diagnosis of SARS-CoV-2 by FDA under an Emergency Use Authorization (EUA). This EUA will remain in effect (meaning this test can be used) for the duration of the COVID-19 declaration under Section 564(b)(1) of the Act, 21 U.S.C. section 360bbb-3(b)(1), unless the authorization is terminated or revoked.  Performed at [facility], [address], [place], [place] [address]          Radiology Studies: No results found.      Scheduled Meds:  amiodarone   200 mg Oral Daily   apixaban   5 mg Oral BID   atorvastatin   20 mg Oral Daily   benzonatate   100 mg Oral TID   diltiazem   30 mg Oral Q8H   doxycycline   100 mg Oral Q12H   levothyroxine   25 mcg Oral Q0600   loratadine   10 mg Oral Daily   metoprolol  tartrate  25 mg Oral BID   midodrine   5 mg Oral TID WC   pantoprazole   40 mg Oral Q0600   potassium chloride   40 mEq Oral Daily   Continuous Infusions:   LOS: 12 days    Time spent: over 30 min    Carla Monte, MD [facility]   To contact the attending provider between 7A-7P or the covering provider during after hours 7P-7A, please log into the web site [email or web address] and access using universal [place] password for that web site. If you do not have the password, please  call the hospital operator.  05/11/2024, 10:27 AM    "

## 2024-05-11 NOTE — Progress Notes (Signed)
 "  Rounding Note   Patient Name: Carla Petersen Date of Encounter: 05/11/2024  [facility] Cardiologist: Lonni LITTIE Nanas, MD   Subjective No chest pain. Shortness of breath improving Clinically looks better compared to yesterday Remains on [place] oxygen.  Became hypoxic with ambulation, per nursing staff Blood pressure and ventricular rates are better controlled  Scheduled Meds:  amiodarone   200 mg Oral Daily   apixaban   5 mg Oral BID   atorvastatin   20 mg Oral Daily   benzonatate   100 mg Oral TID   diltiazem   60 mg Oral Q8H   doxycycline   100 mg Oral Q12H   levothyroxine   25 mcg Oral Q0600   loratadine   10 mg Oral Daily   midodrine   5 mg Oral TID WC   pantoprazole   40 mg Oral Q0600   potassium chloride   40 mEq Oral Daily   Continuous Infusions:   PRN Meds: acetaminophen  **OR** acetaminophen , naphazoline-glycerin , ondansetron  **OR** ondansetron  (ZOFRAN ) IV, mouth rinse   Vital Signs  Vitals:   05/10/24 2324 05/11/24 0408 05/11/24 0612 05/11/24 0820  BP: (!) 89/51 (!) 101/50  100/60  Pulse: 88 85 (!) 101 94  Resp:  20  20  Temp: 98 F (36.7 C) 97.8 F (36.6 C)  98.3 F (36.8 C)  TempSrc:  Oral  Oral  SpO2: 94% 94% 90% 93%  Weight:   75.5 kg   Height:        Intake/Output Summary (Last 24 hours) at 05/11/2024 1117 Last data filed at 05/10/2024 1247 Gross per 24 hour  Intake --  Output 200 ml  Net -200 ml    Net IO Since Admission: -7,718.67 mL [05/11/24 1117]     05/11/2024    6:12 AM 05/10/2024    3:54 AM 05/09/2024    3:31 AM  Last 3 Weights  Weight (lbs) 166 lb 7.2 oz 165 lb 14.4 oz 147 lb 14.9 oz  Weight (kg) 75.5 kg 75.252 kg 67.1 kg      Telemetry Atrial fibrillation with  cVR- Personally Reviewed  ECG  No new ECG  Physical Exam  GEN: No acute distress.   Neck: no JVD Cardiac: iRRR, II/VI systolic murmur, no rubs, or gallops.  Respiratory: decreased breath sounds bilateral, positive rhonchi, no wheezes, rales  GI:  Soft, nontender, non-distended  MS: Trace edema bilaterally; warm, no deformity Neuro:  Nonfocal  Psych: Normal affect   Labs High Sensitivity Troponin:   Recent Labs  Lab 04/29/24 1624 04/29/24 1957  TROPONINIHS 7 8   No results for input(s): TRNPT in the last 720 hours.     Chemistry Recent Labs  Lab 05/09/24 0220 05/10/24 0222 05/11/24 0210  NA 139 139 136  K 3.6 4.0 3.9  CL 96* 96* 97*  CO2 35* 38* 34*  GLUCOSE 105* 109* 108*  BUN 22 19 17   CREATININE 1.27* 1.13* 1.18*  CALCIUM  8.5* 8.9 8.7*  MG 2.0 2.1 2.1  GFRNONAA 41* 47* 45*  ANIONGAP 7 5 5     Lipids No results for input(s): CHOL, TRIG, HDL, LABVLDL, LDLCALC, CHOLHDL in the last 168 hours.  Hematology Recent Labs  Lab 05/09/24 0220 05/10/24 0222 05/11/24 0210  WBC 10.3 10.2 10.6*  RBC 3.92 3.97 3.89  HGB 11.8* 12.0 11.7*  HCT 36.8 37.3 36.6  MCV 93.9 94.0 94.1  MCH 30.1 30.2 30.1  MCHC 32.1 32.2 32.0  RDW 14.2 14.1 14.4  PLT 240 221 230   Thyroid   Recent Labs  Lab 05/08/24  9161  TSH 5.090*    BNP No results for input(s): BNP, PROBNP in the last 168 hours.   DDimer No results for input(s): DDIMER in the last 168 hours.   Radiology  No results found.    Cardiac Studies TTE 04/30/24:  1. Left ventricular ejection fraction, by estimation, is >75%. The left  ventricle has hyperdynamic function. The left ventricle has no regional  wall motion abnormalities. Left ventricular diastolic parameters are  indeterminate.   2. Right ventricular systolic function is normal. The right ventricular  size is normal. There is mildly elevated pulmonary artery systolic  pressure.   3. Left atrial size was severely dilated.   4. Right atrial size was mildly dilated.   5. Moderate pleural effusion in the left lateral region.   6. The mitral valve is normal in structure. Mild to moderate mitral valve  regurgitation. No evidence of mitral stenosis.   7. The aortic valve is calcified.  Aortic valve regurgitation is not  visualized. Mild aortic valve stenosis.   8. The inferior vena cava is dilated in size with >50% respiratory  variability, suggesting right atrial pressure of 8 mmHg.   Patient Profile   Carla Petersen is a 83 y.o. female with a PMH of persistent AF, HFpEF, HLD, Graves' disease who was admitted for acute hypoxic respiratory failure in the setting of acute on chronic HFpEF exacerbation and atrial fibrillation with RVR.  Cardiology was consulted for the evaluation of her cardiovascular issues.  Assessment & Plan  #Acute on Chronic HFpEF Exacerbation #Acute Hypoxic Respiratory Failure - Patient admitted with volume overload in the setting of decompensated heart failure. - Diuresis and GDMT has been challenging due to low blood pressures and AFib. - Net IO Since Admission: -7,718.67 mL [05/11/24 1117] Respiratory viral panel negative Renal function has improved.  Clinical concern is that she may retain volume given her Afib/HFpEF will restart torsemide  20mg  po qOther day.  Strict I's and O's, daily weights S/p thoracentesis R 12/14, s/p L thoracentesis 12/15 Incentive spirometry, encouraged  CXR 05/08/2024 reviewed  GDMT is limited due to soft BP and Afib -currently on midodrine .  #Atrial Fibrillation with cVR - Patient found to be in atrial fibrillation with RVR on admission. - She underwent unsuccessful DCCV on 12/1 and was discharged at an outside hospital on p.o. Amio. - Was placed on IV Amio gtt; however, she remains in A-fib with RVR. - Repeat TEE/DCCV on 12/18 was unsuccessful; however, the patient later converted to NSR on her own. - EP consulted and agreed with continuing amiodarone  without pursuing advanced treatments at this time. Continue amiodarone  200 mg p.o. daily.   Continue Eliquis  5 mg twice daily Maintain K>4, Mg>2 Maintain telemetry Discontinue Lopressor . Will increase Cardizem  from 30 mg p.o. every 8 hours to 60 mg p.o. every 8 hours.   Will transition to longer acting closer to discharge. Continue midodrine  5 mg p.o. 3 times daily for blood pressure support  Would like to avoid digoxin if possible due to low potassium and age - but may consider.  TSH reviewed  Mild pretest probability of restoring sinus rhythm is low at this time given her underlying hypoxia.  Would favor rate controlled A-fib with her activities and to treat her hypoxia prior to discharge.  Longterm antiarrhythmic medication:  IV amiodarone  started 05/01/2024 - 05/04/2024 Change to po amiodarone  twice daily started 05/04/2024 - 05/08/2024 Restarted IV amiodarone  on 05/09/2024 TSH 05/08/2024 AST, ALT within normal limits as of April 30, 2024  Last chest x-ray May 03, 2024 Patient is aware that she will need long-term monitoring of side effect profile if amiodarone  used long-term.  #LUE Amiodarone  Extravasation (improving) #Superficial Thrombophlebitis (improving) - LUE forearm  -resolved, back to baseline.   #HLD Continue atorvastatin  20 mg daily  #Hypokalemia: currently on potassium supplements - per primary team.    For questions or updates, please contact [facility] Please consult [email or web address] for contact info under   Signed, Lenah Messenger, DO  05/11/2024, 11:17 AM    "

## 2024-05-11 NOTE — Progress Notes (Signed)
 Physical Therapy Treatment Patient Details Name: Carla Petersen MRN: [id number] DOB: February 26, 1941 Today's Date: 05/11/2024   History of Present Illness 83 y.o. female presents to [facility] 04/29/24 with heart palpitations. Pt in a-fib/flutter with acute exacerbation of CHF and acute hypoxic respiratory failure. Chest CT showed B effusions. 12/14 s/p L thoracentesis. 12/18 s/p TEE and cardioversion. PMHx: CHF, Graves, HTN    PT Comments  The pt was agreeable to session with focus on progressing OOB mobility and activity. She is able to complete stand-pivot without DME, but is dependent on BUE support for gait. Pt is moving well with supervision using rollator, and states that although she has a rollator at home, she is unable to use it in the house due to space constraints. Pt will continue to benefit from skilled PT acutely to progress dynamic stability without UE support to mimic home environment, and will benefit from HHPT to perform in-home evaluation of space and DME use for optimal safety prior to progression to cardiac rehab.   SpO2 on 2L at rest: 94% SpO2 on 2L with gait: 86% SpO2 on 3L with gait: 88% SpO2 on 4L with gait: 94%    If plan is discharge home, recommend the following: Assist for transportation;Help with stairs or ramp for entrance   Can travel by private vehicle        Equipment Recommendations  Rolling walker (2 wheels) (oxygen)    Recommendations for Other Services       Precautions / Restrictions Precautions Precautions: Fall Recall of Precautions/Restrictions: Intact Precaution/Restrictions Comments: watch HR/O2, up to 4L with gait Restrictions Weight Bearing Restrictions Per Provider Order: No     Mobility  Bed Mobility Overal bed mobility: Modified Independent             General bed mobility comments: increased time and use of bed features    Transfers Overall transfer level: Needs assistance Equipment used: Rollator (4 wheels), None Transfers: Sit  to/from Stand, Bed to chair/wheelchair/BSC Sit to Stand: Supervision   Step pivot transfers: Supervision       General transfer comment: can stand-pivot without UE support on rollator, but benefits from UE support for gait    Ambulation/Gait Ambulation/Gait assistance: Supervision Gait Distance (Feet): 300 Feet Assistive device: Rollator (4 wheels) Gait Pattern/deviations: Step-through pattern, Decreased stride length Gait velocity: 0.45 m/s Gait velocity interpretation: <1.8 ft/sec, indicate of risk for recurrent falls   General Gait Details: steady gait with no overt LOB. HR 110-120bpm, SpO2 >90% on 4L     Balance Overall balance assessment: Needs assistance Sitting-balance support: No upper extremity supported, Feet supported Sitting balance-Leahy Scale: Normal     Standing balance support: Bilateral upper extremity supported, During functional activity Standing balance-Leahy Scale: Fair Standing balance comment: can stand without UE support, dependent on BUE support for gait                            Communication Communication Communication: No apparent difficulties Factors Affecting Communication: Hearing impaired  Cognition Arousal: Alert Behavior During Therapy: WFL for tasks assessed/performed   PT - Cognitive impairments: No apparent impairments                       PT - Cognition Comments: pt with slightly flat affect but could be baseline personality. reports head feels disconnected from my body but unable to describe further. Following commands: Intact      Cueing Cueing Techniques: Verbal  cues  Exercises      General Comments General comments (skin integrity, edema, etc.): VSS on 2.5L upon arrival, tolerated 2L initially with gait but continual drop to low of 86% on 2L after 200 ft, unable to recover on 3L, stable on 4L      Pertinent Vitals/Pain Pain Assessment Pain Assessment: No/denies pain     PT Goals (current  goals can now be found in the care plan section) Acute Rehab PT Goals Patient Stated Goal: to get better and go home PT Goal Formulation: With patient Time For Goal Achievement: 05/20/24 Potential to Achieve Goals: Good Progress towards PT goals: Progressing toward goals    Frequency    Min 2X/week       AM-PAC PT 6 Clicks Mobility   Outcome Measure  Help needed turning from your back to your side while in a flat bed without using bedrails?: None Help needed moving from lying on your back to sitting on the side of a flat bed without using bedrails?: None Help needed moving to and from a bed to a chair (including a wheelchair)?: A Little Help needed standing up from a chair using your arms (e.g., wheelchair or bedside chair)?: A Little Help needed to walk in hospital room?: A Little Help needed climbing 3-5 steps with a railing? : A Lot 6 Click Score: 19    End of Session Equipment Utilized During Treatment: Oxygen;Gait belt Activity Tolerance: Patient tolerated treatment well Patient left: in chair;with call bell/phone within reach Nurse Communication: Mobility status;Other (comment) (HR and O2 sats) PT Visit Diagnosis: Other abnormalities of gait and mobility (R26.89);Muscle weakness (generalized) (M62.81)     Time: 9068-8998 PT Time Calculation (min) (ACUTE ONLY): 30 min  Charges:    $Gait Training: 8-22 mins $Therapeutic Exercise: 8-22 mins PT General Charges $$ ACUTE PT VISIT: 1 Visit                     Izetta Call, PT, DPT   Acute Rehabilitation Department Office [phone] Secure Chat Communication Preferred   Izetta JULIANNA Call 05/11/2024, 10:13 AM

## 2024-05-11 NOTE — Plan of Care (Signed)

## 2024-05-11 NOTE — Plan of Care (Signed)
  Problem: Health Behavior/Discharge Planning: Goal: Ability to manage health-related needs will improve Outcome: Progressing   Problem: Clinical Measurements: Goal: Ability to maintain clinical measurements within normal limits will improve Outcome: Progressing   Problem: Clinical Measurements: Goal: Will remain free from infection Outcome: Progressing   Problem: Clinical Measurements: Goal: Diagnostic test results will improve Outcome: Progressing   

## 2024-05-12 ENCOUNTER — Inpatient Hospital Stay (HOSPITAL_COMMUNITY)

## 2024-05-12 DIAGNOSIS — Z9289 Personal history of other medical treatment: Secondary | ICD-10-CM | POA: Diagnosis not present

## 2024-05-12 DIAGNOSIS — Z7901 Long term (current) use of anticoagulants: Secondary | ICD-10-CM

## 2024-05-12 DIAGNOSIS — J9601 Acute respiratory failure with hypoxia: Secondary | ICD-10-CM | POA: Diagnosis not present

## 2024-05-12 DIAGNOSIS — Z79899 Other long term (current) drug therapy: Secondary | ICD-10-CM | POA: Diagnosis not present

## 2024-05-12 DIAGNOSIS — E782 Mixed hyperlipidemia: Secondary | ICD-10-CM | POA: Diagnosis not present

## 2024-05-12 DIAGNOSIS — I5033 Acute on chronic diastolic (congestive) heart failure: Secondary | ICD-10-CM | POA: Diagnosis not present

## 2024-05-12 DIAGNOSIS — E039 Hypothyroidism, unspecified: Secondary | ICD-10-CM | POA: Diagnosis not present

## 2024-05-12 DIAGNOSIS — I4891 Unspecified atrial fibrillation: Secondary | ICD-10-CM | POA: Diagnosis not present

## 2024-05-12 LAB — COMPREHENSIVE METABOLIC PANEL WITH GFR
ALT: 22 U/L (ref 0–44)
AST: 28 U/L (ref 15–41)
Albumin: 2.8 g/dL — ABNORMAL LOW (ref 3.5–5.0)
Alkaline Phosphatase: 189 U/L — ABNORMAL HIGH (ref 38–126)
Anion gap: 7 (ref 5–15)
BUN: 22 mg/dL (ref 8–23)
CO2: 32 mmol/L (ref 22–32)
Calcium: 8.8 mg/dL — ABNORMAL LOW (ref 8.9–10.3)
Chloride: 100 mmol/L (ref 98–111)
Creatinine, Ser: 1.23 mg/dL — ABNORMAL HIGH (ref 0.44–1.00)
GFR, Estimated: 43 mL/min — ABNORMAL LOW
Glucose, Bld: 108 mg/dL — ABNORMAL HIGH (ref 70–99)
Potassium: 4.4 mmol/L (ref 3.5–5.1)
Sodium: 138 mmol/L (ref 135–145)
Total Bilirubin: 0.6 mg/dL (ref 0.0–1.2)
Total Protein: 6.2 g/dL — ABNORMAL LOW (ref 6.5–8.1)

## 2024-05-12 LAB — CBC WITH DIFFERENTIAL/PLATELET
Abs Immature Granulocytes: 0.03 K/uL (ref 0.00–0.07)
Basophils Absolute: 0.1 K/uL (ref 0.0–0.1)
Basophils Relative: 1 %
Eosinophils Absolute: 0.3 K/uL (ref 0.0–0.5)
Eosinophils Relative: 2 %
HCT: 34.7 % — ABNORMAL LOW (ref 36.0–46.0)
Hemoglobin: 11 g/dL — ABNORMAL LOW (ref 12.0–15.0)
Immature Granulocytes: 0 %
Lymphocytes Relative: 17 %
Lymphs Abs: 1.9 K/uL (ref 0.7–4.0)
MCH: 29.8 pg (ref 26.0–34.0)
MCHC: 31.7 g/dL (ref 30.0–36.0)
MCV: 94 fL (ref 80.0–100.0)
Monocytes Absolute: 1.2 K/uL — ABNORMAL HIGH (ref 0.1–1.0)
Monocytes Relative: 11 %
Neutro Abs: 7.8 K/uL — ABNORMAL HIGH (ref 1.7–7.7)
Neutrophils Relative %: 69 %
Platelets: 240 K/uL (ref 150–400)
RBC: 3.69 MIL/uL — ABNORMAL LOW (ref 3.87–5.11)
RDW: 14.3 % (ref 11.5–15.5)
WBC: 11.2 K/uL — ABNORMAL HIGH (ref 4.0–10.5)
nRBC: 0 % (ref 0.0–0.2)

## 2024-05-12 LAB — MAGNESIUM: Magnesium: 2.1 mg/dL (ref 1.7–2.4)

## 2024-05-12 LAB — PHOSPHORUS: Phosphorus: 3.7 mg/dL (ref 2.5–4.6)

## 2024-05-12 MED ORDER — DILTIAZEM HCL 60 MG PO TABS
60.0000 mg | ORAL_TABLET | Freq: Four times a day (QID) | ORAL | Status: DC
  Administered 2024-05-12 – 2024-05-14 (×7): 60 mg via ORAL
  Filled 2024-05-12 (×7): qty 1

## 2024-05-12 NOTE — Progress Notes (Signed)
 "        [facility]                                                                               Carla Petersen, is a 83 y.o. female, DOB - 08-Feb-1941, FMW:[id number] Admit date - 04/29/2024    Outpatient Primary MD for the patient is Husain, Karrar, MD  LOS - 13  days    Brief summary   Carla Petersen is a 83 y.o. female with medical history significant of CHF, Graves who presented to the emergency department due to heart palpitations -noted to be in A-fib/flutter with worsening shortness of breath orthopnea and dyspnea with exertion consistent with heart failure exacerbation. Cardiology to continue to follow .    Assessment & Plan    Assessment and Plan:  Acute respiratory failure with hypoxia sec to a combination of acute on chronic diastolic HF and possible acute bronchitis.  Cardiology on board and following.  S/p thoracentesis on 12/14.  Repeat CXR today pending.  TEE with EF 60-65%, severely dilated LA size, moderate pleural effusion in L lateral region  [place] oxygen to keep sats greater than 90%.  Currently on torsemide  every other day.  GDMT limited due to low BP and atrial fibrillation.      Persistent atrial fibrillation  - s/p unsuccessful DCCV on 12/1 at OSH, discharged on PO amio - TEE/DCCV 12/18 unsucessful, but she later converted to NSR on her own - EP recommended continuing amiodarone  at 200 mg daily,  - recommend to continue with eliquis , Cardizem  60 mg every 6 hours,    Hypertension Soft/ borderline BP parameters.  Currently on midodrine  for hypotension.    Hypothyroidism:  Resume synthroid .     Hypokalemia Replaced.     Superficial thrombophlebitis of the left upper extremity from amiodarone  extravasation S/p hyaluronidase  Supportive care       RN Pressure Injury Documentation: Wound 04/30/24 1250 Pressure Injury Buttocks Medial;Bilateral Stage 1 -  Intact skin with non-blanchable redness of a localized area usually over a bony  prominence. (Active)     Estimated body mass index is 29.83 kg/m as calculated from the following:   Height as of this encounter: 5' 3 (1.6 m).   Weight as of this encounter: 76.4 kg.  Code Status: full code.  DVT Prophylaxis:  SCDs Start: 04/29/24 2013 apixaban  (ELIQUIS ) tablet 5 mg   Level of Care: Level of care: Progressive Family Communication: none at bedside.   Disposition Plan:     Remains inpatient appropriate:  pending clinical improvement.   Procedures:  Echo Thoracentesis.   Consultants:   Cardiology.   Antimicrobials:   Anti-infectives (From admission, onward)    Start     Dose/Rate Route Frequency Ordered Stop   05/10/24 1245  doxycycline  (VIBRA -TABS) tablet 100 mg        100 mg Oral Every 12 hours 05/10/24 1153 05/15/24 0959        Medications  Scheduled Meds:  amiodarone   200 mg Oral Daily   apixaban   5 mg Oral BID   atorvastatin   20 mg Oral Daily   benzonatate   100 mg Oral TID   diltiazem   60 mg Oral  Q8H   doxycycline   100 mg Oral Q12H   levothyroxine   25 mcg Oral Q0600   loratadine   10 mg Oral Daily   midodrine   5 mg Oral TID WC   pantoprazole   40 mg Oral Q0600   potassium chloride   40 mEq Oral Daily   torsemide   20 mg Oral QODAY   Continuous Infusions: PRN Meds:.acetaminophen  **OR** acetaminophen , naphazoline-glycerin , ondansetron  **OR** ondansetron  (ZOFRAN ) IV, mouth rinse    Subjective:   Carla Petersen was seen and examined today.  Slowly improving. She is in good spirits. No new complaints this morning. She is on 2.5 lit/min of oxygen and appears comfortable.   Objective:   Vitals:   05/11/24 2326 05/12/24 0312 05/12/24 0353 05/12/24 0745  BP: (!) 102/52 (!) 106/51  106/61  Pulse: 66 60 78 86  Resp: 18 19 (!) 21 (!) 24  Temp: 97.9 F (36.6 C) 97.6 F (36.4 C)  97.8 F (36.6 C)  TempSrc: Oral Oral  Oral  SpO2: 91% 92% 92% 92%  Weight:   76.4 kg   Height:        Intake/Output Summary (Last 24 hours) at 05/12/2024  0915 Last data filed at 05/12/2024 0355 Gross per 24 hour  Intake --  Output 50 ml  Net -50 ml   Filed Weights   05/10/24 0354 05/11/24 0612 05/12/24 0353  Weight: 75.3 kg 75.5 kg 76.4 kg     Exam General exam: Ill appearing elderly woman, not in distress.  Respiratory system: diminished air entry at bases, on [place] oxygen.  Cardiovascular system: S1 & S2 heard, irregularly irregular,  Gastrointestinal system: Abdomen is  soft bs+ Central nervous system: Alert and oriented. No focal neurological deficits. Extremities: Symmetric 5 x 5 power. Skin: No rashes, Psychiatry:  Mood & affect appropriate.     Data Reviewed:  I have personally reviewed following labs and imaging studies   CBC Lab Results  Component Value Date   WBC 11.2 (H) 05/12/2024   RBC 3.69 (L) 05/12/2024   HGB 11.0 (L) 05/12/2024   HCT 34.7 (L) 05/12/2024   MCV 94.0 05/12/2024   MCH 29.8 05/12/2024   PLT 240 05/12/2024   MCHC 31.7 05/12/2024   RDW 14.3 05/12/2024   LYMPHSABS 1.9 05/12/2024   MONOABS 1.2 (H) 05/12/2024   EOSABS 0.3 05/12/2024   BASOSABS 0.1 05/12/2024     Last metabolic panel Lab Results  Component Value Date   NA 138 05/12/2024   K 4.4 05/12/2024   CL 100 05/12/2024   CO2 32 05/12/2024   BUN 22 05/12/2024   CREATININE 1.23 (H) 05/12/2024   GLUCOSE 108 (H) 05/12/2024   GFRNONAA 43 (L) 05/12/2024   GFRAA 52 (L) 05/30/2020   CALCIUM  8.8 (L) 05/12/2024   PHOS 3.7 05/12/2024   PROT 6.2 (L) 05/12/2024   ALBUMIN 2.8 (L) 05/12/2024   LABGLOB 3.1 04/02/2021   AGRATIO 1.4 04/02/2021   BILITOT 0.6 05/12/2024   ALKPHOS 189 (H) 05/12/2024   AST 28 05/12/2024   ALT 22 05/12/2024   ANIONGAP 7 05/12/2024    CBG (last 3)  No results for input(s): GLUCAP in the last 72 hours.    Coagulation Profile: No results for input(s): INR, PROTIME in the last 168 hours.   Radiology Studies: No results found.     Elgie Butter M.D. [facility] 05/12/2024, 9:15  AM  Available via Epic secure chat 7am-7pm After 7 pm, please refer to night coverage provider listed on amion.    "

## 2024-05-12 NOTE — Progress Notes (Signed)
 "  Rounding Note   Patient Name: Carla Petersen Date of Encounter: 05/12/2024  [facility] Cardiologist: Carla LITTIE Nanas, MD   Subjective No events overnight  Did convert to SINUS RHYTHM yesterday but this morning during rounds back in Afib.  Shortness of breath is better  No chest pain   Scheduled Meds:  amiodarone   200 mg Oral Daily   apixaban   5 mg Oral BID   atorvastatin   20 mg Oral Daily   benzonatate   100 mg Oral TID   diltiazem   60 mg Oral Q6H   doxycycline   100 mg Oral Q12H   levothyroxine   25 mcg Oral Q0600   loratadine   10 mg Oral Daily   midodrine   5 mg Oral TID WC   pantoprazole   40 mg Oral Q0600   potassium chloride   40 mEq Oral Daily   torsemide   20 mg Oral QODAY   Continuous Infusions:   PRN Meds: acetaminophen  **OR** acetaminophen , naphazoline-glycerin , ondansetron  **OR** ondansetron  (ZOFRAN ) IV, mouth rinse   Vital Signs  Vitals:   05/11/24 2326 05/12/24 0312 05/12/24 0353 05/12/24 0745  BP: (!) 102/52 (!) 106/51  106/61  Pulse: 66 60 78 86  Resp: 18 19 (!) 21 (!) 24  Temp: 97.9 F (36.6 C) 97.6 F (36.4 C)  97.8 F (36.6 C)  TempSrc: Oral Oral  Oral  SpO2: 91% 92% 92% 92%  Weight:   76.4 kg   Height:        Intake/Output Summary (Last 24 hours) at 05/12/2024 0925 Last data filed at 05/12/2024 0355 Gross per 24 hour  Intake --  Output 50 ml  Net -50 ml    Net IO Since Admission: -7,768.67 mL [05/12/24 0925]     05/12/2024    3:53 AM 05/11/2024    6:12 AM 05/10/2024    3:54 AM  Last 3 Weights  Weight (lbs) 168 lb 6.4 oz 166 lb 7.2 oz 165 lb 14.4 oz  Weight (kg) 76.386 kg 75.5 kg 75.252 kg      Telemetry SINUS RHYTHM yesterday and earlier this morning.  Atrial fibrillation with  cVR- Personally Reviewed  ECG  No new ECG  Physical Exam  GEN: No acute distress.   Neck: no JVD Cardiac: iRRR, II/VI systolic murmur, no rubs, or gallops.  Respiratory: decreased breath sounds bilateral, positive rhonchi, no  wheezes, rales  GI: Soft, nontender, non-distended  MS: Trace edema bilaterally; warm, no deformity Neuro:  Nonfocal  Psych: Normal affect   Labs High Sensitivity Troponin:   Recent Labs  Lab 04/29/24 1624 04/29/24 1957  TROPONINIHS 7 8   No results for input(s): TRNPT in the last 720 hours.     Chemistry Recent Labs  Lab 05/10/24 0222 05/11/24 0210 05/12/24 0234  NA 139 136 138  K 4.0 3.9 4.4  CL 96* 97* 100  CO2 38* 34* 32  GLUCOSE 109* 108* 108*  BUN 19 17 22   CREATININE 1.13* 1.18* 1.23*  CALCIUM  8.9 8.7* 8.8*  MG 2.1 2.1 2.1  PROT  --   --  6.2*  ALBUMIN  --   --  2.8*  AST  --   --  28  ALT  --   --  22  ALKPHOS  --   --  189*  BILITOT  --   --  0.6  GFRNONAA 47* 45* 43*  ANIONGAP 5 5 7     Lipids No results for input(s): CHOL, TRIG, HDL, LABVLDL, LDLCALC, CHOLHDL in the last 168  hours.  Hematology Recent Labs  Lab 05/10/24 0222 05/11/24 0210 05/12/24 0234  WBC 10.2 10.6* 11.2*  RBC 3.97 3.89 3.69*  HGB 12.0 11.7* 11.0*  HCT 37.3 36.6 34.7*  MCV 94.0 94.1 94.0  MCH 30.2 30.1 29.8  MCHC 32.2 32.0 31.7  RDW 14.1 14.4 14.3  PLT 221 230 240   Thyroid   Recent Labs  Lab 05/08/24 0838  TSH 5.090*    BNP No results for input(s): BNP, PROBNP in the last 168 hours.   DDimer No results for input(s): DDIMER in the last 168 hours.   Radiology  No results found.    Cardiac Studies TTE 04/30/24:  1. Left ventricular ejection fraction, by estimation, is >75%. The left  ventricle has hyperdynamic function. The left ventricle has no regional  wall motion abnormalities. Left ventricular diastolic parameters are  indeterminate.   2. Right ventricular systolic function is normal. The right ventricular  size is normal. There is mildly elevated pulmonary artery systolic  pressure.   3. Left atrial size was severely dilated.   4. Right atrial size was mildly dilated.   5. Moderate pleural effusion in the left lateral region.   6. The  mitral valve is normal in structure. Mild to moderate mitral valve  regurgitation. No evidence of mitral stenosis.   7. The aortic valve is calcified. Aortic valve regurgitation is not  visualized. Mild aortic valve stenosis.   8. The inferior vena cava is dilated in size with >50% respiratory  variability, suggesting right atrial pressure of 8 mmHg.   Patient Profile   Carla Petersen is a 83 y.o. female with a PMH of persistent AF, HFpEF, HLD, Graves' disease who was admitted for acute hypoxic respiratory failure in the setting of acute on chronic HFpEF exacerbation and atrial fibrillation with RVR.  Cardiology was consulted for the evaluation of her cardiovascular issues.  Assessment & Plan  #Acute on Chronic HFpEF Exacerbation #Acute Hypoxic Respiratory Failure - Patient admitted with volume overload in the setting of decompensated heart failure. - Diuresis and GDMT has been challenging due to low blood pressures and AFib. - Net IO Since Admission: -7,768.67 mL [05/12/24 0925] Respiratory viral panel negative Renal function has improved.  Clinical concern is that she may retain volume given her Afib/HFpEF will restart torsemide  20mg  po qOther day.  Strict I's and O's, daily weights S/p thoracentesis R 12/14, s/p L thoracentesis 12/15 Incentive spirometry, encouraged  CXR 05/08/2024 reviewed  GDMT is limited due to soft BP and Afib -currently on midodrine .  #Atrial Fibrillation with cVR - Patient found to be in atrial fibrillation with RVR on admission. - She underwent unsuccessful DCCV on 12/1 and was discharged at an outside hospital on p.o. Amio. - Was placed on IV Amio gtt; however, she remains in A-fib with RVR. - Repeat TEE/DCCV on 12/18 was unsuccessful; however, the patient later converted to NSR on her own. - EP consulted and agreed with continuing amiodarone  without pursuing advanced treatments at this time. Continue amiodarone  200 mg p.o. daily.   Continue Eliquis  5 mg twice  daily Maintain K>4, Mg>2 Maintain telemetry Was on Lopressor  but discontinued  Will increase Cardizem  from 60 mg p.o. every 8 hours to 60 mg p.o. every 6 hours.  Will transition to longer acting closer to discharge. Continue midodrine  5 mg p.o. 3 times daily for blood pressure support  Would like to avoid digoxin if possible due to low potassium and age - but may consider.  TSH reviewed  Overnight she did have periods of SINUS RHYTHM but now in Afib cVR I am hopeful that once her hypoxia is better controlled she will restore and maintain SINUS RHYTHM.   Longterm antiarrhythmic medication:  IV amiodarone  started 05/01/2024 - 05/04/2024 Change to po amiodarone  twice daily started 05/04/2024 - 05/08/2024 Restarted IV amiodarone  on 05/09/2024 TSH 05/08/2024 AST, ALT within normal limits as of April 30, 2024 Last chest x-ray May 03, 2024; morning CXR is pending Patient is aware that she will need long-term monitoring of side effect profile if amiodarone  used long-term.  #LUE Amiodarone  Extravasation (improving) #Superficial Thrombophlebitis (improving) - LUE forearm  -resolved, back to baseline.   #HLD Continue atorvastatin  20 mg daily  #Hypokalemia: currently on potassium supplements - per primary team.    For questions or updates, please contact [facility] Please consult [email or web address] for contact info under   Signed, Shaquisha Wynn, DO  05/12/2024, 9:25 AM    "

## 2024-05-12 NOTE — Plan of Care (Signed)

## 2024-05-12 NOTE — Plan of Care (Signed)

## 2024-05-12 NOTE — Progress Notes (Signed)
 Mobility Specialist Progress Note:    05/12/24 0900  Mobility  Activity Ambulated with assistance  Level of Assistance Standby assist, set-up cues, supervision of patient - no hands on  Assistive Device Four wheel walker  Distance Ambulated (ft) 250 ft  Activity Response Tolerated well  Mobility Referral Yes  Mobility visit 1 Mobility  Mobility Specialist Start Time (ACUTE ONLY) 0910  Mobility Specialist Stop Time (ACUTE ONLY) 0935  Mobility Specialist Time Calculation (min) (ACUTE ONLY) 25 min   Pt received in bed agreeable to mobility. Found on 2/5L/min SPO2 was 93%. Attempted to ambulate on 4L/min but d/t desat to 84% O2 flow increased to 6L/min. No physical assistance needed throughout. Returned to room w/o fault. Left in bed w/ call bell and personal belongings in reach. All needs met.   Thersia Minder Mobility Specialist  Please contact vis Secure Chat or  Rehab Office [phone]

## 2024-05-13 DIAGNOSIS — Z9289 Personal history of other medical treatment: Secondary | ICD-10-CM | POA: Diagnosis not present

## 2024-05-13 DIAGNOSIS — J9601 Acute respiratory failure with hypoxia: Secondary | ICD-10-CM | POA: Diagnosis not present

## 2024-05-13 DIAGNOSIS — E039 Hypothyroidism, unspecified: Secondary | ICD-10-CM | POA: Diagnosis not present

## 2024-05-13 DIAGNOSIS — Z7901 Long term (current) use of anticoagulants: Secondary | ICD-10-CM | POA: Diagnosis not present

## 2024-05-13 DIAGNOSIS — I4891 Unspecified atrial fibrillation: Secondary | ICD-10-CM | POA: Diagnosis not present

## 2024-05-13 DIAGNOSIS — I5033 Acute on chronic diastolic (congestive) heart failure: Secondary | ICD-10-CM | POA: Diagnosis not present

## 2024-05-13 DIAGNOSIS — Z79899 Other long term (current) drug therapy: Secondary | ICD-10-CM | POA: Diagnosis not present

## 2024-05-13 DIAGNOSIS — E782 Mixed hyperlipidemia: Secondary | ICD-10-CM | POA: Diagnosis not present

## 2024-05-13 MED ORDER — MIDODRINE HCL 5 MG PO TABS: 5.0000 mg | ORAL_TABLET | Freq: Three times a day (TID) | ORAL | 0 refills | Status: DC

## 2024-05-13 MED ORDER — POTASSIUM CHLORIDE CRYS ER 20 MEQ PO TBCR: 40.0000 meq | EXTENDED_RELEASE_TABLET | Freq: Every day | ORAL | 1 refills | Status: AC

## 2024-05-13 MED ORDER — TORSEMIDE 20 MG PO TABS: 20.0000 mg | ORAL_TABLET | ORAL | 3 refills | Status: AC

## 2024-05-13 MED ORDER — PANTOPRAZOLE SODIUM 40 MG PO TBEC: 40.0000 mg | DELAYED_RELEASE_TABLET | Freq: Every day | ORAL | 0 refills | Status: AC

## 2024-05-13 MED ORDER — BENZONATATE 100 MG PO CAPS: 100.0000 mg | ORAL_CAPSULE | Freq: Three times a day (TID) | ORAL | 0 refills | Status: DC

## 2024-05-13 MED ORDER — LORATADINE 10 MG PO TABS: 10.0000 mg | ORAL_TABLET | Freq: Every day | ORAL | Status: AC

## 2024-05-13 MED ORDER — DOXYCYCLINE HYCLATE 100 MG PO TABS: 100.0000 mg | ORAL_TABLET | Freq: Two times a day (BID) | ORAL | 0 refills | Status: AC

## 2024-05-13 NOTE — Progress Notes (Signed)
 "  Rounding Note   Patient Name: [name] Date of Encounter: 05/13/2024  [facility] Cardiologist: Lonni LITTIE Nanas, MD   Subjective Patient seen and examined at bedside. Remains on nasal cannula oxygen. Ventricular rate better controlled  Scheduled Meds:  amiodarone   200 mg Oral Daily   apixaban   5 mg Oral BID   atorvastatin   20 mg Oral Daily   benzonatate   100 mg Oral TID   diltiazem   60 mg Oral Q6H   doxycycline   100 mg Oral Q12H   levothyroxine   25 mcg Oral Q0600   loratadine   10 mg Oral Daily   midodrine   5 mg Oral TID WC   pantoprazole   40 mg Oral Q0600   potassium chloride   40 mEq Oral Daily   torsemide   20 mg Oral QODAY   Continuous Infusions:   PRN Meds: acetaminophen  **OR** acetaminophen , naphazoline-glycerin , ondansetron  **OR** ondansetron  (ZOFRAN ) IV, mouth rinse   Vital Signs  Vitals:   05/12/24 2344 05/13/24 0421 05/13/24 0624 05/13/24 0843  BP: (!) 110/53 (!) 94/57  (!) 111/52  Pulse: 82 85 100 97  Resp: (!) 21 (!) 25 (!) 22 19  Temp: 98.5 F (36.9 C) 97.7 F (36.5 C)  97.9 F (36.6 C)  TempSrc:    Oral  SpO2: 94% 93% 91% 95%  Weight:   74.8 kg   Height:        Intake/Output Summary (Last 24 hours) at 05/13/2024 0937 Last data filed at 05/12/2024 2100 Gross per 24 hour  Intake --  Output 1150 ml  Net -1150 ml    Net IO Since Admission: -8,918.67 mL [05/13/24 0937]     05/13/2024    6:24 AM 05/12/2024    3:53 AM 05/11/2024    6:12 AM  Last 3 Weights  Weight (lbs) 164 lb 14.5 oz 168 lb 6.4 oz 166 lb 7.2 oz  Weight (kg) 74.8 kg 76.386 kg 75.5 kg      Telemetry Atrial fibrillation with controlled ventricular rate personally Reviewed  ECG  No new ECG  Physical Exam  GEN: No acute distress.   Neck: no JVD Cardiac: iRRR, II/VI systolic murmur, no rubs, or gallops.  Respiratory: decreased breath sounds bilateral, positive rhonchi, no wheezes, rales.  Nasal cannula GI: Soft, nontender, non-distended  MS:  Trace edema bilaterally; warm, no deformity Neuro:  Nonfocal  Psych: Normal affect   Labs High Sensitivity Troponin:   Recent Labs  Lab 04/29/24 1624 04/29/24 1957  TROPONINIHS 7 8   No results for input(s): TRNPT in the last 720 hours.     Chemistry Recent Labs  Lab 05/10/24 0222 05/11/24 0210 05/12/24 0234  NA 139 136 138  K 4.0 3.9 4.4  CL 96* 97* 100  CO2 38* 34* 32  GLUCOSE 109* 108* 108*  BUN 19 17 22   CREATININE 1.13* 1.18* 1.23*  CALCIUM  8.9 8.7* 8.8*  MG 2.1 2.1 2.1  PROT  --   --  6.2*  ALBUMIN  --   --  2.8*  AST  --   --  28  ALT  --   --  22  ALKPHOS  --   --  189*  BILITOT  --   --  0.6  GFRNONAA 47* 45* 43*  ANIONGAP 5 5 7     Lipids No results for input(s): CHOL, TRIG, HDL, LABVLDL, LDLCALC, CHOLHDL in the last 168 hours.  Hematology Recent Labs  Lab 05/10/24 0222 05/11/24 0210 05/12/24 0234  WBC 10.2 10.6*  11.2*  RBC 3.97 3.89 3.69*  HGB 12.0 11.7* 11.0*  HCT 37.3 36.6 34.7*  MCV 94.0 94.1 94.0  MCH 30.2 30.1 29.8  MCHC 32.2 32.0 31.7  RDW 14.1 14.4 14.3  PLT 221 230 240   Thyroid   Recent Labs  Lab 05/08/24 0838  TSH 5.090*    BNP No results for input(s): BNP, PROBNP in the last 168 hours.   DDimer No results for input(s): DDIMER in the last 168 hours.   Radiology  DG CHEST PORT 1 VIEW Result Date: 05/12/2024 EXAM: 1 VIEW(S) XRAY OF THE CHEST 05/12/2024 07:11:00 AM COMPARISON: 05/08/2024 CLINICAL HISTORY: Shortness of breath FINDINGS: LINES, TUBES AND DEVICES: Multiple wires and leads project over the chest on the frontal radiograph. LUNGS AND PLEURA: Small left and trace right pleural effusions are not significantly changed. Mild interstitial edema is similar. Left base airspace disease persists. No pneumothorax. HEART AND MEDIASTINUM: Moderate cardiomegaly. BONES AND SOFT TISSUES: Patient rotated to the right. No acute osseous abnormality. IMPRESSION: 1. No significant change since 05/08/2024. 2. Congestive heart  failure with small left greater than right pleural effusions. 3. Left base airspace disease which could represent atelectasis or concurrent infection. Electronically signed by: Rockey Kilts MD 05/12/2024 11:35 AM EST RP Workstation: [id number]      Cardiac Studies TTE 04/30/24:  1. Left ventricular ejection fraction, by estimation, is >75%. The left  ventricle has hyperdynamic function. The left ventricle has no regional  wall motion abnormalities. Left ventricular diastolic parameters are  indeterminate.   2. Right ventricular systolic function is normal. The right ventricular  size is normal. There is mildly elevated pulmonary artery systolic  pressure.   3. Left atrial size was severely dilated.   4. Right atrial size was mildly dilated.   5. Moderate pleural effusion in the left lateral region.   6. The mitral valve is normal in structure. Mild to moderate mitral valve  regurgitation. No evidence of mitral stenosis.   7. The aortic valve is calcified. Aortic valve regurgitation is not  visualized. Mild aortic valve stenosis.   8. The inferior vena cava is dilated in size with >50% respiratory  variability, suggesting right atrial pressure of 8 mmHg.   Patient Profile   [name] is a 83 y.o. female with a PMH of persistent AF, HFpEF, HLD, Graves' disease who was admitted for acute hypoxic respiratory failure in the setting of acute on chronic HFpEF exacerbation and atrial fibrillation with RVR.  Cardiology was consulted for the evaluation of her cardiovascular issues.  Assessment & Plan  #Acute on Chronic HFpEF Exacerbation #Acute Hypoxic Respiratory Failure - Patient admitted with volume overload in the setting of decompensated heart failure. - Diuresis and GDMT has been challenging due to low blood pressures and AFib. - Net IO Since Admission: -8,918.67 mL [05/13/24 0937] Respiratory viral panel negative Renal function has improved.  Clinical concern is that she may retain  volume given her Afib/HFpEF will restart torsemide  20mg  po qOther day.  Strict I's and O's, daily weights S/p thoracentesis R 12/14, s/p L thoracentesis 12/15 Incentive spirometry, encouraged  CXR 05/08/2024 reviewed  GDMT is limited due to soft BP and Afib -currently on midodrine .  #Atrial Fibrillation with cVR - Patient found to be in atrial fibrillation with RVR on admission. - She underwent unsuccessful DCCV on 12/1 and was discharged at an outside hospital on p.o. Amio. - Was placed on IV Amio gtt; however, she remains in A-fib with RVR. - Repeat TEE/DCCV on 12/18  was unsuccessful; however, the patient later converted to NSR on her own. - EP consulted and agreed with continuing amiodarone  without pursuing advanced treatments at this time. Continue amiodarone  200 mg p.o. daily.   Continue Eliquis  5 mg twice daily Maintain K>4, Mg>2 Maintain telemetry Was on Lopressor  but discontinued  Continue Cardizem  60 mg p.o. every 6 hours.  Has only been tolerating three doses over the the last 48 hours. Will transition to longer acting closer to discharge. Continue midodrine  5 mg p.o. 3 times daily for blood pressure support  Would like to avoid digoxin if possible due to low potassium and age - but may consider.  TSH reviewed  Until her respiratory status does not improve the likelihood of maintaining sinus rhythm will be difficult.  Patient is wanting to go home.  Will discuss with primary team with regards to discharging her home on oxygen if needed and with close outpatient follow-up with Dr. Lonni Savory clinic.  Longterm antiarrhythmic medication:  IV amiodarone  started 05/01/2024 - 05/04/2024 Change to po amiodarone  twice daily started 05/04/2024 - 05/08/2024 Restarted IV amiodarone  on 05/09/2024 TSH 05/08/2024 AST, ALT within normal limits as of April 30, 2024 Last chest x-ray May 12, 2024 Patient is aware that she will need long-term monitoring of side effect  profile if amiodarone  used long-term.  #LUE Amiodarone  Extravasation (improving) #Superficial Thrombophlebitis (improving) - LUE forearm  -resolved, back to baseline.   #HLD Continue atorvastatin  20 mg daily  #Hypokalemia: currently on potassium supplements - per primary team.    For questions or updates, please contact [facility] Please consult [email or web address] for contact info under   Signed, Madonna Large, DO  05/13/2024, 9:37 AM    "

## 2024-05-13 NOTE — Progress Notes (Signed)
 Occupational Therapy Treatment Patient Details Name: Carla Petersen MRN: [id number] DOB: 12-05-1940 Today's Date: 05/13/2024   History of present illness 83 y.o. female presents to [facility] 04/29/24 with heart palpitations. Pt in a-fib/flutter with acute exacerbation of CHF and acute hypoxic respiratory failure. Chest CT showed B effusions. 12/14 s/p L thoracentesis. 12/18 s/p TEE and cardioversion. PMHx: CHF, Graves, HTN   OT comments  Patient continues to make good gains with OT treatment. Patient was able to get to EOB without assistance and supervision for mobility with RW and self care standing at sink. Patient required seated rest break between tasks and SpO2 was 87-93% on 2.5 liters O2.  Discharge recommendations continue to be appropriate.  Acute OT to continue to follow to address established goals.       If plan is discharge home, recommend the following:  A little help with bathing/dressing/bathroom;A little help with walking and/or transfers   Equipment Recommendations  None recommended by OT    Recommendations for Other Services      Precautions / Restrictions Precautions Precautions: Fall Recall of Precautions/Restrictions: Intact Precaution/Restrictions Comments: watch HR/O2, up to 4L with gait Restrictions Weight Bearing Restrictions Per Provider Order: No       Mobility Bed Mobility Overal bed mobility: Modified Independent             General bed mobility comments: increased time and use of rail    Transfers Overall transfer level: Needs assistance Equipment used: Rolling walker (2 wheels) Transfers: Sit to/from Stand, Bed to chair/wheelchair/BSC Sit to Stand: Supervision           General transfer comment: supervision for safety     Balance Overall balance assessment: Needs assistance Sitting-balance support: No upper extremity supported, Feet supported Sitting balance-Leahy Scale: Normal Sitting balance - Comments: EOB   Standing balance  support: Single extremity supported, Bilateral upper extremity supported, During functional activity Standing balance-Leahy Scale: Fair Standing balance comment: can stand without UE support with reaching tasks while standing                           ADL either performed or assessed with clinical judgement   ADL Overall ADL's : Needs assistance/impaired     Grooming: Wash/dry hands;Wash/dry face;Oral care;Supervision/safety;Standing;Sitting Grooming Details (indicate cue type and reason): at sink, required seated rest break following             Lower Body Dressing: Supervision/safety;Sit to/from stand   Toilet Transfer: Supervision/safety;Rolling walker (2 wheels);Regular Toilet;BSC/3in1                  Extremity/Trunk Assessment              Diplomatic Services Operational Officer Communication Communication: No apparent difficulties Factors Affecting Communication: Hearing impaired   Cognition Arousal: Alert Behavior During Therapy: WFL for tasks assessed/performed Cognition: No apparent impairments                               Following commands: Intact        Cueing   Cueing Techniques: Verbal cues  Exercises Exercises: Other exercises Other Exercises Other Exercises: standing reaching tasks with supervision    Shoulder Instructions       General Comments 2.5 liters O2 with SpO2 87-93%    Pertinent Vitals/ Pain  Pain Assessment Pain Assessment: No/denies pain  Home Living                                          Prior Functioning/Environment              Frequency  Min 2X/week        Progress Toward Goals  OT Goals(current goals can now be found in the care plan section)  Progress towards OT goals: Progressing toward goals  Acute Rehab OT Goals Patient Stated Goal: to go home OT Goal Formulation: With patient Time For Goal Achievement:  05/20/24 Potential to Achieve Goals: Good ADL Goals Pt Will Perform Grooming: with modified independence;standing Pt Will Perform Lower Body Dressing: with modified independence;sit to/from stand Pt Will Transfer to Toilet: with modified independence;ambulating;regular height toilet  Plan      Co-evaluation                 AM-PAC OT 6 Clicks Daily Activity     Outcome Measure   Help from another person eating meals?: None Help from another person taking care of personal grooming?: A Little Help from another person toileting, which includes using toliet, bedpan, or urinal?: A Little Help from another person bathing (including washing, rinsing, drying)?: A Little Help from another person to put on and taking off regular upper body clothing?: None Help from another person to put on and taking off regular lower body clothing?: A Little 6 Click Score: 20    End of Session Equipment Utilized During Treatment: Rolling walker (2 wheels);Oxygen  OT Visit Diagnosis: Unsteadiness on feet (R26.81)   Activity Tolerance Patient tolerated treatment well   Patient Left in chair;with call bell/phone within reach;with chair alarm set   Nurse Communication Mobility status        Time: 9249-9182 OT Time Calculation (min): 27 min  Charges: OT General Charges $OT Visit: 1 Visit OT Treatments $Self Care/Home Management : 23-37 mins  Carla Petersen, OTA Acute Rehabilitation Services  Office [phone]   Carla Petersen 05/13/2024, 10:54 AM

## 2024-05-13 NOTE — Progress Notes (Signed)
 "        [facility]                                                                               Mirel Hundal, is a 82 y.o. female, DOB - [birth year], FMW:[id number] Admit date - 04/29/2024    Outpatient Primary MD for the patient is Husain, Karrar, MD  LOS - 14  days    Brief summary   Carla Petersen is a 83 y.o. female with medical history significant of CHF, Graves who presented to the emergency department due to heart palpitations -noted to be in A-fib/flutter with worsening shortness of breath orthopnea and dyspnea with exertion consistent with heart failure exacerbation. Cardiology to continue to follow .    Assessment & Plan    Assessment and Plan:  Acute respiratory failure with hypoxia sec to a combination of acute on chronic diastolic HF and possible acute bronchitis.  Cardiology on board and following.  S/p thoracentesis on 12/14.  Repeat CXR shows unchanged from before.  TEE with EF 60-65%, severely dilated LA size, moderate pleural effusion in L lateral region  [place] oxygen to keep sats greater than 90%.  Currently on torsemide  every other day. Replace K as needed.  Complete the course of doxycycline .  GDMT limited due to low BP and atrial fibrillation.  On ambulation she requires about 3l it of [place] oxygen.      Persistent atrial fibrillation  - s/p unsuccessful DCCV on 12/1 at OSH, discharged on PO amio - TEE/DCCV 12/18 unsucessful, but she later converted to NSR on her own - EP recommended continuing amiodarone  at 200 mg daily,  - recommend to continue with eliquis , and cardizem    Hypertension Soft/ borderline BP parameters.  Currently on midodrine  for hypotension.    Hypothyroidism:  Resume synthroid .     Hypokalemia Replaced. Repeat levels in am.     Superficial thrombophlebitis of the left upper extremity from amiodarone  extravasation S/p hyaluronidase  Supportive care       RN Pressure Injury Documentation: Wound 04/30/24 1250  Pressure Injury Buttocks Medial;Bilateral Stage 1 -  Intact skin with non-blanchable redness of a localized area usually over a bony prominence. (Active)     Estimated body mass index is 29.21 kg/m as calculated from the following:   Height as of this encounter: 5' 3 (1.6 m).   Weight as of this encounter: 74.8 kg.  Code Status: full code.  DVT Prophylaxis:  SCDs Start: 04/29/24 2013 apixaban  (ELIQUIS ) tablet 5 mg   Level of Care: Level of care: Progressive Family Communication: none at bedside.   Disposition Plan:     Remains inpatient appropriate:  discharge in the next 24 hours.   Procedures:  Echo Thoracentesis.   Consultants:   Cardiology.   Antimicrobials:   Anti-infectives (From admission, onward)    Start     Dose/Rate Route Frequency Ordered Stop   05/13/24 0000  doxycycline  (VIBRA -TABS) 100 MG tablet        100 mg Oral Every 12 hours 05/13/24 1452 05/15/24 2359   05/10/24 1245  doxycycline  (VIBRA -TABS) tablet 100 mg        100 mg Oral Every 12 hours 05/10/24  1153 05/15/24 0959        Medications  Scheduled Meds:  amiodarone   200 mg Oral Daily   apixaban   5 mg Oral BID   atorvastatin   20 mg Oral Daily   benzonatate   100 mg Oral TID   diltiazem   60 mg Oral Q6H   doxycycline   100 mg Oral Q12H   levothyroxine   25 mcg Oral Q0600   loratadine   10 mg Oral Daily   midodrine   5 mg Oral TID WC   pantoprazole   40 mg Oral Q0600   potassium chloride   40 mEq Oral Daily   torsemide   20 mg Oral QODAY   Continuous Infusions: PRN Meds:.acetaminophen  **OR** acetaminophen , naphazoline-glycerin , ondansetron  **OR** ondansetron  (ZOFRAN ) IV, mouth rinse    Subjective:   Carla Petersen was seen and examined today.  No new complaints today.   Objective:   Vitals:   05/13/24 1430 05/13/24 1431 05/13/24 1700 05/13/24 1703  BP: (!) 123/54 (!) 123/54 (!) 118/49 (!) 118/49  Pulse: (!) 108 (!) 112 (!) 104   Resp: 18 18 16    Temp:   98.4 F (36.9 C)   TempSrc:    Oral   SpO2: 95% 94% 94%   Weight:      Height:        Intake/Output Summary (Last 24 hours) at 05/13/2024 1710 Last data filed at 05/12/2024 2100 Gross per 24 hour  Intake --  Output 150 ml  Net -150 ml   Filed Weights   05/11/24 0612 05/12/24 0353 05/13/24 0624  Weight: 75.5 kg 76.4 kg 74.8 kg     Exam General exam: Appears calm and comfortable  Respiratory system: diminished air entry at bases, on [place] oxygen. No wheezing heard.  Cardiovascular system: S1 & S2 heard,irregularly irregular, tachycardic.  Gastrointestinal system: Abdomen is soft bs+ Central nervous system: Alert and oriented. No focal neurological deficits. Extremities: Symmetric 5 x 5 power. Skin: No rashes, Psychiatry:  Mood & affect appropriate.      Data Reviewed:  I have personally reviewed following labs and imaging studies   CBC Lab Results  Component Value Date   WBC 11.2 (H) 05/12/2024   RBC 3.69 (L) 05/12/2024   HGB 11.0 (L) 05/12/2024   HCT 34.7 (L) 05/12/2024   MCV 94.0 05/12/2024   MCH 29.8 05/12/2024   PLT 240 05/12/2024   MCHC 31.7 05/12/2024   RDW 14.3 05/12/2024   LYMPHSABS 1.9 05/12/2024   MONOABS 1.2 (H) 05/12/2024   EOSABS 0.3 05/12/2024   BASOSABS 0.1 05/12/2024     Last metabolic panel Lab Results  Component Value Date   NA 138 05/12/2024   K 4.4 05/12/2024   CL 100 05/12/2024   CO2 32 05/12/2024   BUN 22 05/12/2024   CREATININE 1.23 (H) 05/12/2024   GLUCOSE 108 (H) 05/12/2024   GFRNONAA 43 (L) 05/12/2024   GFRAA 52 (L) 05/30/2020   CALCIUM  8.8 (L) 05/12/2024   PHOS 3.7 05/12/2024   PROT 6.2 (L) 05/12/2024   ALBUMIN 2.8 (L) 05/12/2024   LABGLOB 3.1 04/02/2021   AGRATIO 1.4 04/02/2021   BILITOT 0.6 05/12/2024   ALKPHOS 189 (H) 05/12/2024   AST 28 05/12/2024   ALT 22 05/12/2024   ANIONGAP 7 05/12/2024    CBG (last 3)  No results for input(s): GLUCAP in the last 72 hours.    Coagulation Profile: No results for input(s): INR, PROTIME in the last  168 hours.   Radiology Studies: DG CHEST PORT 1 VIEW Result Date:  05/12/2024 EXAM: 1 VIEW(S) XRAY OF THE CHEST 05/12/2024 07:11:00 AM COMPARISON: 05/08/2024 CLINICAL HISTORY: Shortness of breath FINDINGS: LINES, TUBES AND DEVICES: Multiple wires and leads project over the chest on the frontal radiograph. LUNGS AND PLEURA: Small left and trace right pleural effusions are not significantly changed. Mild interstitial edema is similar. Left base airspace disease persists. No pneumothorax. HEART AND MEDIASTINUM: Moderate cardiomegaly. BONES AND SOFT TISSUES: Patient rotated to the right. No acute osseous abnormality. IMPRESSION: 1. No significant change since 05/08/2024. 2. Congestive heart failure with small left greater than right pleural effusions. 3. Left base airspace disease which could represent atelectasis or concurrent infection. Electronically signed by: Rockey Kilts MD 05/12/2024 11:35 AM EST RP Workstation: [id number]       Elgie Butter M.D. [facility] 05/13/2024, 5:10 PM  Available via Epic secure chat 7am-7pm After 7 pm, please refer to night coverage provider listed on amion.    "

## 2024-05-13 NOTE — Plan of Care (Signed)

## 2024-05-13 NOTE — Progress Notes (Signed)
 Physical Therapy Treatment Patient Details Name: Carla Petersen MRN: [id number] DOB: [birth year] Today's Date: 05/13/2024   History of Present Illness 83 y.o. female presents to [facility] 04/29/24 with heart palpitations. Pt in a-fib/flutter with acute exacerbation of CHF and acute hypoxic respiratory failure. Chest CT showed B effusions. 12/14 s/p L thoracentesis. 12/18 s/p TEE and cardioversion. PMHx: CHF, Graves, HTN    PT Comments  Pt up in the recliner on arrival, pleasant and agreeable to session. Session focused on gait without UE support as pt stating that her home cannot accommodate her rollator. Pt requiring grossly CGA during ambulation without UE support as pt with noted instability and slower gait speed without DME. Pt utilizing L handrail in hall with noted increased in stability. Discussed ise of SPC with pt stating she has 2 at home and is familiar with use, plan to trial cane in next session. Educated pt on energy conservation techniques with pt verbalizing understanding. SpO2 91-94% on 3L O2 throughout mobility. Pt continues to benefit from skilled PT services to progress toward functional mobility goals.     If plan is discharge home, recommend the following: Assist for transportation;Help with stairs or ramp for entrance   Can travel by private vehicle        Equipment Recommendations  Rolling walker (2 wheels)    Recommendations for Other Services       Precautions / Restrictions Precautions Precautions: Fall Recall of Precautions/Restrictions: Intact Precaution/Restrictions Comments: watch HR/O2, up to 4L with gait Restrictions Weight Bearing Restrictions Per Provider Order: No     Mobility  Bed Mobility Overal bed mobility: Modified Independent             General bed mobility comments: pt up in chair on arrival and at end of session    Transfers Overall transfer level: Needs assistance Equipment used: Rolling walker (2 wheels) Transfers: Sit to/from  Stand, Bed to chair/wheelchair/BSC Sit to Stand: Contact guard assist           General transfer comment: CGA to stand without AD support, pt reaching out to self steady with LUE    Ambulation/Gait Ambulation/Gait assistance: Contact guard assist Gait Distance (Feet): 120 Feet Assistive device: None (L hand rail) Gait Pattern/deviations: Step-through pattern, Decreased stride length Gait velocity: decr     General Gait Details: pt with slow guarded steps without UE support, CGA for safety as pt gnerally unsteady but no overt LOB noted. pt utilizning L hand rail once in hall with increased stability, discussed use of cane with pt stating she has 2   Optometrist     Tilt Bed    Modified Rankin (Stroke Patients Only)       Balance Overall balance assessment: Needs assistance Sitting-balance support: No upper extremity supported, Feet supported Sitting balance-Leahy Scale: Normal Sitting balance - Comments: EOB   Standing balance support: Single extremity supported, Bilateral upper extremity supported, During functional activity Standing balance-Leahy Scale: Fair Standing balance comment: can stand without UE support with reaching tasks while standing                            Communication Communication Communication: No apparent difficulties Factors Affecting Communication: Hearing impaired  Cognition Arousal: Alert Behavior During Therapy: [facility] for tasks assessed/performed  Following commands: Intact      Cueing Cueing Techniques: Verbal cues  Exercises      General Comments General comments (skin integrity, edema, etc.): pt recieved on 2.5L O2 with SpO2 92-94% at rest, placed on 3L for mobility as tank without 0.5 incriments. pt maintaining SpO2 91-93% on 3L, replaced 2.5L at end of session      Pertinent Vitals/Pain Pain Assessment Pain Assessment: No/denies pain     Home Living                          Prior Function            PT Goals (current goals can now be found in the care plan section) Progress towards PT goals: Progressing toward goals    Frequency    Min 2X/week      PT Plan      Co-evaluation              AM-PAC PT 6 Clicks Mobility   Outcome Measure  Help needed turning from your back to your side while in a flat bed without using bedrails?: None Help needed moving from lying on your back to sitting on the side of a flat bed without using bedrails?: None Help needed moving to and from a bed to a chair (including a wheelchair)?: A Little Help needed standing up from a chair using your arms (e.g., wheelchair or bedside chair)?: A Little Help needed to walk in hospital room?: A Little Help needed climbing 3-5 steps with a railing? : A Lot 6 Click Score: 19    End of Session Equipment Utilized During Treatment: Oxygen;Gait belt Activity Tolerance: Patient tolerated treatment well Patient left: in chair;with call bell/phone within reach;with chair alarm set Nurse Communication: Mobility status PT Visit Diagnosis: Other abnormalities of gait and mobility (R26.89);Muscle weakness (generalized) (M62.81)     Time: 1025-1050 PT Time Calculation (min) (ACUTE ONLY): 25 min  Charges:    $Gait Training: 8-22 mins $Therapeutic Activity: 8-22 mins PT General Charges $$ ACUTE PT VISIT: 1 Visit                     Jakyren Fluegge R. PTA Acute Rehabilitation Services Office: [phone]   Therisa CHRISTELLA Boor 05/13/2024, 12:37 PM

## 2024-05-13 NOTE — TOC Transition Note (Signed)
 Transition of Care [facility]) - Discharge Note   Patient Details  Name: Aarian Cleaver MRN: [id number] Date of Birth: 13-May-1941  [facility]) CM/SW Contact:  Waddell Barnie Rama, RN Phone Number: 05/13/2024, 3:38 PM   Clinical Narrative:    For dc today, she has home oxygen  and she is set up with OP therapy per previous NCM note.     Barriers to Discharge: Continued Medical Work up   Patient Goals and CMS Choice Patient states their goals for this hospitalization and ongoing recovery are:: return home CMS Medicare.gov Compare Post Acute Care list provided to:: Patient Choice offered to / list presented to : Patient      Discharge Placement                       Discharge Plan and Services Additional resources added to the After Visit Summary for     Discharge Planning Services: CM Consult                                 Social Drivers of Health (SDOH) Interventions SDOH Screenings   Food Insecurity: No Food Insecurity (04/30/2024)  Housing: Low Risk (04/30/2024)  Transportation Needs: No Transportation Needs (04/30/2024)  Utilities: Not At Risk (04/30/2024)  Social Connections: Moderately Isolated (04/30/2024)  Tobacco Use: Medium Risk (05/05/2024)     Readmission Risk Interventions     No data to display

## 2024-05-14 ENCOUNTER — Other Ambulatory Visit (HOSPITAL_COMMUNITY): Payer: Self-pay

## 2024-05-14 DIAGNOSIS — I10 Essential (primary) hypertension: Secondary | ICD-10-CM | POA: Diagnosis not present

## 2024-05-14 DIAGNOSIS — E039 Hypothyroidism, unspecified: Secondary | ICD-10-CM | POA: Diagnosis not present

## 2024-05-14 DIAGNOSIS — E876 Hypokalemia: Secondary | ICD-10-CM | POA: Diagnosis not present

## 2024-05-14 DIAGNOSIS — I4819 Other persistent atrial fibrillation: Secondary | ICD-10-CM | POA: Diagnosis not present

## 2024-05-14 DIAGNOSIS — I350 Nonrheumatic aortic (valve) stenosis: Secondary | ICD-10-CM

## 2024-05-14 DIAGNOSIS — I4891 Unspecified atrial fibrillation: Secondary | ICD-10-CM | POA: Diagnosis not present

## 2024-05-14 DIAGNOSIS — E782 Mixed hyperlipidemia: Secondary | ICD-10-CM | POA: Diagnosis not present

## 2024-05-14 DIAGNOSIS — Z7901 Long term (current) use of anticoagulants: Secondary | ICD-10-CM | POA: Diagnosis not present

## 2024-05-14 DIAGNOSIS — J9601 Acute respiratory failure with hypoxia: Secondary | ICD-10-CM | POA: Diagnosis not present

## 2024-05-14 DIAGNOSIS — I5033 Acute on chronic diastolic (congestive) heart failure: Secondary | ICD-10-CM | POA: Diagnosis not present

## 2024-05-14 LAB — CBC WITH DIFFERENTIAL/PLATELET
Abs Immature Granulocytes: 0.02 K/uL (ref 0.00–0.07)
Basophils Absolute: 0.1 K/uL (ref 0.0–0.1)
Basophils Relative: 1 %
Eosinophils Absolute: 0.2 K/uL (ref 0.0–0.5)
Eosinophils Relative: 3 %
HCT: 36.6 % (ref 36.0–46.0)
Hemoglobin: 11.6 g/dL — ABNORMAL LOW (ref 12.0–15.0)
Immature Granulocytes: 0 %
Lymphocytes Relative: 26 %
Lymphs Abs: 2.2 K/uL (ref 0.7–4.0)
MCH: 29.8 pg (ref 26.0–34.0)
MCHC: 31.7 g/dL (ref 30.0–36.0)
MCV: 94.1 fL (ref 80.0–100.0)
Monocytes Absolute: 0.8 K/uL (ref 0.1–1.0)
Monocytes Relative: 10 %
Neutro Abs: 5.2 K/uL (ref 1.7–7.7)
Neutrophils Relative %: 60 %
Platelets: 247 K/uL (ref 150–400)
RBC: 3.89 MIL/uL (ref 3.87–5.11)
RDW: 14.4 % (ref 11.5–15.5)
WBC: 8.6 K/uL (ref 4.0–10.5)
nRBC: 0 % (ref 0.0–0.2)

## 2024-05-14 LAB — BASIC METABOLIC PANEL WITH GFR
Anion gap: 7 (ref 5–15)
BUN: 17 mg/dL (ref 8–23)
CO2: 33 mmol/L — ABNORMAL HIGH (ref 22–32)
Calcium: 8.8 mg/dL — ABNORMAL LOW (ref 8.9–10.3)
Chloride: 101 mmol/L (ref 98–111)
Creatinine, Ser: 1.14 mg/dL — ABNORMAL HIGH (ref 0.44–1.00)
GFR, Estimated: 47 mL/min — ABNORMAL LOW
Glucose, Bld: 94 mg/dL (ref 70–99)
Potassium: 4 mmol/L (ref 3.5–5.1)
Sodium: 141 mmol/L (ref 135–145)

## 2024-05-14 MED ORDER — DILTIAZEM HCL ER COATED BEADS 180 MG PO CP24: 180.0000 mg | ORAL_CAPSULE | Freq: Every day | ORAL | 3 refills | Status: AC

## 2024-05-14 MED ORDER — AMIODARONE HCL 200 MG PO TABS: 200.0000 mg | ORAL_TABLET | Freq: Two times a day (BID) | ORAL | Status: DC

## 2024-05-14 MED ORDER — MIDODRINE HCL 5 MG PO TABS
5.0000 mg | ORAL_TABLET | Freq: Three times a day (TID) | ORAL | 0 refills | Status: AC
  Filled 2024-05-14: qty 270, 90d supply, fill #0

## 2024-05-14 MED ORDER — DILTIAZEM HCL ER COATED BEADS 180 MG PO CP24: 180.0000 mg | ORAL_CAPSULE | Freq: Every day | ORAL | Status: DC

## 2024-05-14 NOTE — Plan of Care (Signed)
" °  Problem: Education: Goal: Knowledge of General Education information will improve Description: Including pain rating scale, medication(s)/side effects and non-pharmacologic comfort measures Outcome: Adequate for Discharge   Problem: Health Behavior/Discharge Planning: Goal: Ability to manage health-related needs will improve Outcome: Adequate for Discharge   Problem: Clinical Measurements: Goal: Ability to maintain clinical measurements within normal limits will improve Outcome: Adequate for Discharge Goal: Will remain free from infection Outcome: Adequate for Discharge Goal: Diagnostic test results will improve Outcome: Adequate for Discharge Goal: Respiratory complications will improve Outcome: Adequate for Discharge Goal: Cardiovascular complication will be avoided Outcome: Adequate for Discharge   Problem: Activity: Goal: Risk for activity intolerance will decrease Outcome: Adequate for Discharge   Problem: Nutrition: Goal: Adequate nutrition will be maintained 05/14/2024 1237 by Charlann Lucie CROME, RN Outcome: Adequate for Discharge 05/14/2024 1008 by Charlann Lucie CROME, RN Outcome: Progressing   Problem: Coping: Goal: Level of anxiety will decrease 05/14/2024 1237 by Charlann Lucie CROME, RN Outcome: Adequate for Discharge 05/14/2024 1008 by Charlann Lucie CROME, RN Outcome: Progressing   Problem: Elimination: Goal: Will not experience complications related to bowel motility 05/14/2024 1237 by Charlann Lucie CROME, RN Outcome: Adequate for Discharge 05/14/2024 1008 by Charlann Lucie CROME, RN Outcome: Progressing Goal: Will not experience complications related to urinary retention 05/14/2024 1237 by Charlann Lucie CROME, RN Outcome: Adequate for Discharge 05/14/2024 1008 by Charlann Lucie CROME, RN Outcome: Progressing   Problem: Pain Managment: Goal: General experience of comfort will improve and/or be controlled 05/14/2024 1237 by Charlann Lucie CROME, RN Outcome: Adequate  for Discharge 05/14/2024 1008 by Charlann Lucie CROME, RN Outcome: Progressing   Problem: Safety: Goal: Ability to remain free from injury will improve 05/14/2024 1237 by Charlann Lucie CROME, RN Outcome: Adequate for Discharge 05/14/2024 1008 by Charlann Lucie CROME, RN Outcome: Progressing   Problem: Skin Integrity: Goal: Risk for impaired skin integrity will decrease 05/14/2024 1237 by Charlann Lucie CROME, RN Outcome: Adequate for Discharge 05/14/2024 1008 by Charlann Lucie CROME, RN Outcome: Progressing   "

## 2024-05-14 NOTE — Plan of Care (Signed)

## 2024-05-14 NOTE — Progress Notes (Addendum)
 "   Progress Note  Patient Name: Carla Petersen Date of Encounter: 05/14/2024  Primary Cardiologist: Lonni LITTIE Nanas, MD  Subjective   No symptoms.  Inpatient Medications    Scheduled Meds:  amiodarone   200 mg Oral Daily   apixaban   5 mg Oral BID   atorvastatin   20 mg Oral Daily   benzonatate   100 mg Oral TID   diltiazem   60 mg Oral Q6H   doxycycline   100 mg Oral Q12H   levothyroxine   25 mcg Oral Q0600   loratadine   10 mg Oral Daily   midodrine   5 mg Oral TID WC   pantoprazole   40 mg Oral Q0600   potassium chloride   40 mEq Oral Daily   torsemide   20 mg Oral QODAY   Continuous Infusions:  PRN Meds: acetaminophen  **OR** acetaminophen , naphazoline-glycerin , ondansetron  **OR** ondansetron  (ZOFRAN ) IV, mouth rinse   Vital Signs    Vitals:   05/13/24 2302 05/14/24 0422 05/14/24 0500 05/14/24 0749  BP: (!) 120/49 (!) 123/46  (!) 98/46  Pulse: 71 71  71  Resp: 20 (!) 23  17  Temp: 97.7 F (36.5 C) 98 F (36.7 C)  97.8 F (36.6 C)  TempSrc: Oral Oral  Axillary  SpO2: 93% 92%  93%  Weight:   70.5 kg   Height:       No intake or output data in the 24 hours ending 05/14/24 0852 Filed Weights   05/12/24 0353 05/13/24 0624 05/14/24 0500  Weight: 76.4 kg 74.8 kg 70.5 kg    Telemetry     Personally reviewed.  In A-fib, HR 80-90s  ECG    Not performed today.  Physical Exam   GEN: No acute distress.   Neck: No JVD. Cardiac: Irregular rate and rhythm, no murmur, rub, or gallop.  Respiratory: Nonlabored. Clear to auscultation bilaterally. GI: Soft, nontender, bowel sounds present. MS: No edema; No deformity. Neuro:  Nonfocal. Psych: Alert and oriented x 3. Normal affect.  Labs    Chemistry Recent Labs  Lab 05/11/24 0210 05/12/24 0234 05/14/24 0249  NA 136 138 141  K 3.9 4.4 4.0  CL 97* 100 101  CO2 34* 32 33*  GLUCOSE 108* 108* 94  BUN 17 22 17   CREATININE 1.18* 1.23* 1.14*  CALCIUM  8.7* 8.8* 8.8*  PROT  --  6.2*  --   ALBUMIN  --  2.8*   --   AST  --  28  --   ALT  --  22  --   ALKPHOS  --  189*  --   BILITOT  --  0.6  --   GFRNONAA 45* 43* 47*  ANIONGAP 5 7 7      Hematology Recent Labs  Lab 05/11/24 0210 05/12/24 0234 05/14/24 0249  WBC 10.6* 11.2* 8.6  RBC 3.89 3.69* 3.89  HGB 11.7* 11.0* 11.6*  HCT 36.6 34.7* 36.6  MCV 94.1 94.0 94.1  MCH 30.1 29.8 29.8  MCHC 32.0 31.7 31.7  RDW 14.4 14.3 14.4  PLT 230 240 247    Cardiac Enzymes Recent Labs  Lab 04/29/24 1624 04/29/24 1957  TROPONINIHS 7 8    BNPNo results for input(s): BNP, PROBNP in the last 168 hours.   DDimerNo results for input(s): DDIMER in the last 168 hours.   Radiology    No results found.   Assessment & Plan    Atrial fibrillation with controlled rate - Telemetry reviewed, in A-fib, HR 80-90s. - Underwent unsuccessful DCCV on 04/18/2024 at OSH. Repeat  TEE guided DCCV on 05/05/2024 was unsuccessful.  But later, the patient spontaneously converted to NSR.  Now back in A-fib.  Continue medical management.  Unlikely she will convert to NSR with DCCV. - Increase p.o. amiodarone  from 200 mg once daily to twice daily. - Previously on metoprolol  that was discontinued.  Currently on p.o. diltiazem  60 mg Q6h, switch to long-acting diltiazem  180 mg once daily.  On midodrine  for BP support. - Continue Eliquis  5 mg twice daily. - Echo this admission showed LVEF more than 75%, normal RV function, severe LA dilatation, moderate left pleural effusion, mild aortic valve stenosis and CVP 8 mmHg. - Outpatient cardiology follow-up.  Acute hypoxic respiratory failure secondary to ADHF - Compensated, continue p.o. torsemide  every other day.  Mild aortic valve stenosis by echo in December 2025 - Outpatient surveillance.  [facility] will sign off.   Medication Recommendations: Continue above medications Other recommendations (labs, testing, etc): None Follow up as an outpatient: Keep appointment with [facility], cardiology on 05/27/2024 and with  [facility], cardiology on 06/20/2024.   Signed, Diannah SHAUNNA Maywood, MD  05/14/2024, 8:52 AM    "

## 2024-05-14 NOTE — Discharge Summary (Signed)
 " Physician Discharge Summary   Patient: Carla Petersen MRN: [id number] DOB: 02/09/1941  Admit date:     04/29/2024  Discharge date: 05/14/2024  Discharge Physician: Elgie Butter   PCP: Ransom Other, MD   Recommendations at discharge:  Please follow up with cardiology as scheduled.  Please follow up with PCP in one week.  Please check cbc and bmp in one week.   Discharge Diagnoses: Principal Problem:   Acute exacerbation of CHF (congestive heart failure) (HCC) Active Problems:   Acute on chronic diastolic heart failure (HCC)   Acute respiratory failure with hypoxia (HCC)   Atrial fibrillation with rapid ventricular response (HCC)   Prolonged QT interval   Hypokalemia   Hypothyroidism   Hypertension   Pressure injury of skin   Persistent atrial fibrillation (HCC)   History of cardioversion   Mixed hyperlipidemia   Acute on chronic heart failure with preserved ejection fraction (HFpEF) (HCC)   Long term current use of antiarrhythmic drug   Long term (current) use of anticoagulants   Cough    Hospital Course: Carla Petersen is a 83 y.o. female with medical history significant of CHF, Graves who presented to the emergency department due to heart palpitations -noted to be in A-fib/flutter with worsening shortness of breath orthopnea and dyspnea with exertion consistent with heart failure exacerbation. Cardiology to continue to follow .   Assessment and Plan:  Acute respiratory failure with hypoxia sec to a combination of acute on chronic diastolic HF and possible acute bronchitis.  Cardiology on board and following.  S/p thoracentesis on 12/14.  Repeat CXR shows unchanged from before.  TEE with EF 60-65%, severely dilated LA size, moderate pleural effusion in L lateral region  [place] oxygen to keep sats greater than 90%.  Currently on torsemide  every other day. Replace K as needed.  Complete the course of doxycycline .  GDMT limited due to low BP and atrial fibrillation.  On  ambulation she requires about 3l it of [place] oxygen.          Persistent atrial fibrillation  - s/p unsuccessful DCCV on 12/1 at OSH, discharged on PO amio - TEE/DCCV 12/18 unsucessful, but she later converted to NSR on her own - EP recommended continuing amiodarone  at 200 mg  twice daily.  - change to cardizem  180 mg daily.  - recommend to continue with eliquis , and cardizem      Hypertension BP parameters are optimal.  Currently on midodrine  for hypotension.      Hypothyroidism:  Resume synthroid .        Hypokalemia Replaced. Repeat levels wnl.       Superficial thrombophlebitis of the left upper extremity from amiodarone  extravasation S/p hyaluronidase  Supportive care          RN Pressure Injury Documentation: Wound 04/30/24 1250 Pressure Injury Buttocks Medial;Bilateral Stage 1 -  Intact skin with non-blanchable redness of a localized area usually over a bony prominence. (Active)             Consultants: cardiology.  Procedures performed: DCCV  Disposition: Home Diet recommendation:  Cardiac diet DISCHARGE MEDICATION: Allergies as of 05/14/2024       Reactions   Latex Swelling   Penicillins Rash   Total body rash from ankles to neck.   Sulfa Antibiotics Nausea And Vomiting   Pedi-pre Tape Spray [wound Dressing Adhesive] Rash   A band-aid caused rash on finger        Medication List     STOP taking these medications  amLODipine  5 MG tablet Commonly known as: NORVASC    furosemide  40 MG tablet Commonly known as: LASIX    metoprolol  tartrate 50 MG tablet Commonly known as: LOPRESSOR        TAKE these medications    acetaminophen  500 MG tablet Commonly known as: TYLENOL  Take 500 mg by mouth daily as needed. PRN   ALPRAZolam 0.5 MG tablet Commonly known as: XANAX Take 0.5 mg by mouth daily as needed.   amiodarone  200 MG tablet Commonly known as: PACERONE  Take 200 mg by mouth 2 (two) times daily.   atorvastatin  20 MG  tablet Commonly known as: LIPITOR TAKE 1 TABLET BY MOUTH DAILY   benzonatate  100 MG capsule Commonly known as: TESSALON  Take 1 capsule (100 mg total) by mouth 3 (three) times daily.   calcium  carbonate 1250 (500 Ca) MG chewable tablet Commonly known as: OS-CAL Chew 1 tablet by mouth daily.   diltiazem  180 MG 24 hr capsule Commonly known as: CARDIZEM  CD Take 1 capsule (180 mg total) by mouth daily. What changed:  medication strength how much to take   doxycycline  100 MG tablet Commonly known as: VIBRA -TABS Take 1 tablet (100 mg total) by mouth every 12 (twelve) hours for 2 days.   Eliquis  5 MG Tabs tablet Generic drug: apixaban  TAKE 1 TABLET BY MOUTH 2 TIMES A DAY   ferrous sulfate 325 (65 FE) MG EC tablet Take 325 mg by mouth daily with breakfast.   Fish Oil Concentrate 300 MG Caps Take 1 capsule by mouth daily.   levothyroxine  25 MCG tablet Commonly known as: SYNTHROID  Take 25 mcg by mouth daily.   loratadine  10 MG tablet Commonly known as: CLARITIN  Take 1 tablet (10 mg total) by mouth daily.   midodrine  5 MG tablet Commonly known as: PROAMATINE  Take 1 tablet (5 mg total) by mouth 3 (three) times daily with meals.   pantoprazole  40 MG tablet Commonly known as: PROTONIX  Take 1 tablet (40 mg total) by mouth daily at 6 (six) AM.   potassium chloride  SA 20 MEQ tablet Commonly known as: KLOR-CON  M Take 2 tablets (40 mEq total) by mouth daily.   PreserVision AREDS 2+Multi Vit Caps Take 1 capsule by mouth daily.   torsemide  20 MG tablet Commonly known as: DEMADEX  Take 1 tablet (20 mg total) by mouth every other day.   Vitamin D (Cholecalciferol) 25 MCG (1000 UT) Caps Take 1 capsule by mouth daily.   VITAMIN D MAINTENANCE PO Take 800 Units by mouth daily.               Durable Medical Equipment  (From admission, onward)           Start     Ordered   05/14/24 1206  DME Shower stool  Once        05/14/24 1205   05/13/24 1449  For home use  only DME oxygen  Once       Question Answer Comment  Length of Need Lifetime   Mode or (Route) Nasal cannula   Liters per Minute 3   Frequency Continuous (stationary and portable oxygen unit needed)   Oxygen conserving device Yes   Oxygen delivery system: Gas      05/13/24 1448            Follow-up Information     [facility] Follow up.   Specialty: Rehabilitation Why: Call to schedule apt of physical therapy Contact information: [address], [address] [place]  [place]  [address] [phone]               Discharge Exam: Filed Weights   05/12/24 0353 05/13/24 0624 05/14/24 0500  Weight: 76.4 kg 74.8 kg 70.5 kg   General exam: Appears calm and comfortable  Respiratory system: DIMINISHED AIR ENTRY at bases. On 2.5 to 3 lit of [place] oxygen.  Cardiovascular system: S1 & S2 heard, RRR. No JVD Gastrointestinal system: Abdomen is SOFT BS+ Central nervous system: Alert and oriented. No focal neurological deficits. Extremities: Symmetric 5 x 5 power. Skin: No rashes,  Psychiatry: Mood & affect appropriate.    Condition at discharge: fair  The results of significant diagnostics from this hospitalization (including imaging, microbiology, ancillary and laboratory) are listed below for reference.   Imaging Studies: DG CHEST PORT 1 VIEW Result Date: 05/12/2024 EXAM: 1 VIEW(S) XRAY OF THE CHEST 05/12/2024 07:11:00 AM COMPARISON: 05/08/2024 CLINICAL HISTORY: Shortness of breath FINDINGS: LINES, TUBES AND DEVICES: Multiple wires and leads project over the chest on the frontal radiograph. LUNGS AND PLEURA: Small left and trace right pleural effusions are not significantly changed. Mild interstitial edema is similar. Left base airspace disease persists. No pneumothorax. HEART AND MEDIASTINUM: Moderate cardiomegaly. BONES AND SOFT TISSUES: Patient rotated to the right. No acute osseous abnormality. IMPRESSION: 1. No significant change  since 05/08/2024. 2. Congestive heart failure with small left greater than right pleural effusions. 3. Left base airspace disease which could represent atelectasis or concurrent infection. Electronically signed by: Rockey Kilts MD 05/12/2024 11:35 AM EST RP Workstation: [id number]   DG Chest 2 View Result Date: 05/08/2024 CLINICAL DATA:  Shortness of breath.  CHF. EXAM: CHEST - 2 VIEW COMPARISON:  None Available. FINDINGS: Mild cardiomegaly with mild central vascular congestion. Small bilateral pleural effusions and bibasilar atelectasis. No pneumothorax. No acute osseous pathology. IMPRESSION: 1. Mild cardiomegaly with mild central vascular congestion. 2. Small bilateral pleural effusions and bibasilar atelectasis. Electronically Signed   By: Vanetta Chou M.D.   On: 05/08/2024 12:51   ECHO TEE Result Date: 05/05/2024    TRANSESOPHOGEAL ECHO REPORT   Patient Name:   DEZTINEE LOHMEYER Date of Exam: 05/05/2024 Medical Rec #:  [id number]      Height:       63.0 in Accession #:    [id number]     Weight:       169.0 lb Date of Birth:  01-Aug-1940       BSA:          1.800 m Patient Age:    83 years       BP:           96/52 mmHg Patient Gender: F              HR:           112 bpm. Exam Location:  Inpatient Procedure: Transesophageal Echo, 3D Echo, Color Doppler and Cardiac Doppler            (Both Spectral and Color Flow Doppler were utilized during            procedure). Indications:     Atrial Fibrillation  History:         Patient has prior history of Echocardiogram examinations, most                  recent 04/30/2024. CHF, Arrythmias:Atrial Fibrillation; Risk                  Factors:Sleep Apnea.  Sonographer:  Koleen Popper RDCS Referring Phys:  [id number] [facility] A SANTO Diagnosing Phys: Stanly Santo MD PROCEDURE: After discussion of the risks and benefits of a TEE, an informed consent was obtained from the patient. The transesophogeal probe was passed without difficulty through the esophogus  of the patient. Imaged were obtained with the patient in a left lateral decubitus position. Sedation performed by different physician. The patient was monitored while under deep sedation. Anesthestetic sedation was provided intravenously by Anesthesiology: 173mg  of Propofol . Image quality was good. The patient developed no complications during the procedure. An unsuccessful direct current cardioversion was performed. 1st attempt at 200J, 2nd attempt at 250J, and 3rd attempt at 300J.  IMPRESSIONS  1. Left ventricular ejection fraction, by estimation, is 60 to 65%. The left ventricle has normal function.  2. Right ventricular systolic function is normal. The right ventricular size is normal.  3. Left atrial size was severely dilated. No left atrial/left atrial appendage thrombus was detected.  4. Right atrial size was severely dilated.  5. Moderate pleural effusion in the left lateral region.  6. The mitral valve is normal in structure. Mild to moderate mitral valve regurgitation. No evidence of mitral stenosis.  7. Functional bicuspid . 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 Aortic valve regurgitation is not visualized. No aortic stenosis is present.  8. 3D performed of the LAA and demonstrates Maximal landing zone 17 mm with wall distance 17 mm. FINDINGS  Left Ventricle: Left ventricular ejection fraction, by estimation, is 60 to 65%. The left ventricle has normal function. The left ventricular internal cavity size was normal in size. Right Ventricle: The right ventricular size is normal. No increase in right ventricular wall thickness. Right ventricular systolic function is normal. Left Atrium: Left atrial size was severely dilated. No left atrial/left atrial appendage thrombus was detected. Right Atrium: Right atrial size was severely dilated. Pericardium: There is no evidence of pericardial effusion. Mitral Valve: The mitral valve is normal in structure. Mild to moderate mitral valve regurgitation. No evidence of mitral  valve stenosis. Tricuspid Valve: The tricuspid valve is normal in structure. Tricuspid valve regurgitation is mild . No evidence of tricuspid stenosis. Aortic Valve: Functional bicuspid. 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 Aortic valve regurgitation is not visualized. No aortic stenosis is present. Pulmonic Valve: The pulmonic valve was not well visualized. Pulmonic valve regurgitation is not visualized. No evidence of pulmonic stenosis. Aorta: The aortic root and ascending aorta are structurally normal, with no evidence of dilitation. IAS/Shunts: No atrial level shunt detected by color flow Doppler. Additional Comments: There is a moderate pleural effusion in the left lateral region. Spectral Doppler performed. MR Peak grad: 102.8 mmHg  TRICUSPID VALVE MR Vmax:      507.00 cm/s TR Peak grad:   28.7 mmHg                           TR Vmax:        268.00 cm/s Stanly Santo MD Electronically signed by Stanly Santo MD Signature Date/Time: 05/05/2024/4:54:12 PM    Final    EP STUDY Result Date: 05/05/2024 See surgical note for result.  CT CHEST WO CONTRAST Result Date: 05/04/2024 EXAM: CT CHEST WITHOUT CONTRAST 05/04/2024 12:32:18 AM TECHNIQUE: CT of the chest was performed without the administration of intravenous contrast. Multiplanar reformatted images are provided for review. Automated exposure control, iterative reconstruction, and/or weight based adjustment of the mA/kV was utilized to reduce the radiation dose to as low as reasonably  achievable. COMPARISON: Chest x-ray from the previous day, CT from 04/29/2024. CLINICAL HISTORY: Clinical history is pleural effusion and shortness of breath FINDINGS: MEDIASTINUM: Heart: No cardiac enlargement is seen. Coronary calcifications are noted. Pericardium is unremarkable. The central airways are clear. Atherosclerotic calcifications of the aorta are noted without an aneurysmal dilatation. The esophagus is unremarkable. LYMPH NODES: Scattered lymph nodes are  noted with normal fatty hila within the mediastinum. These are likely reactive in nature. No hilar or axillary lymphadenopathy. LUNGS AND PLEURA: Bilateral pleural effusions are noted, similar to that seen on the prior exam. Bilateral lower lobe consolidation, also stable from the prior study. 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 No sizable parenchymal nodule is noted. No pneumothorax. SOFT TISSUES/BONES: Stable compression deformity at T11. No acute abnormality of the bones or soft tissues. UPPER ABDOMEN: Limited images of the upper abdomen demonstrate nodular changes of the liver, consistent with underlying cirrhosis. IMPRESSION: 1. Bilateral pleural effusions, similar to prior exam. 2. Bilateral lower lobe consolidation, stable from prior study. Electronically signed by: Oneil Devonshire MD 05/04/2024 12:44 AM EST RP Workstation: [id number]   DG CHEST PORT 1 VIEW Result Date: 05/03/2024 EXAM: 1 VIEW(S) XRAY OF THE CHEST 05/03/2024 08:07:00 AM COMPARISON: 05/01/2024 CLINICAL HISTORY: Hypoxic respiratory failure (HCC) FINDINGS: LUNGS AND PLEURA: Mild pulmonary edema and interstitial opacities improved. Small bilateral pleural effusions, left greater than right. Bibasilar opacities, likely atelectasis. No pneumothorax. HEART AND MEDIASTINUM: Cardiomegaly. Aortic atherosclerosis. BONES AND SOFT TISSUES: Degenerative changes of left shoulder. No acute osseous abnormality. IMPRESSION: 1. Mild pulmonary edema and interstitial opacities, improved. 2. Small bilateral pleural effusions, left greater than right. 3. Cardiomegaly and aortic atherosclerosis. Electronically signed by: Evalene Coho MD 05/03/2024 08:16 AM EST RP Workstation: [id number]   IR US  CHEST Result Date: 05/02/2024 INDICATION: 83 year old female. History of CHF. Found to have bilateral pleural effusions. Request is for therapeutic right-sided the thoracentesis EXAM: CHEST ULTRASOUND COMPARISON:  Chest x-ray dated  May 01, 2024 FINDINGS: Small left-sided pleural effusion. IMPRESSION: Small left-sided pleural effusion. Patient declined the procedure at this time. Read by: Delon Beagle, NP Electronically Signed   By: Cordella Banner   On: 05/02/2024 16:21   DG CHEST PORT 1 VIEW Result Date: 05/01/2024 CLINICAL DATA:  [id number] S/P thoracentesis [id number] EXAM: PORTABLE CHEST 1 VIEW COMPARISON:  May 01, 2024 FINDINGS: The cardiomediastinal silhouette is unchanged and enlarged in contour. Moderate LEFT pleural effusion. Decreased haziness overlying the RIGHT hemithorax consistent with decreased RIGHT-sided pleural effusion. No significant pneumothorax. Persistent LEFT retrocardiac homogeneous opacity. Background of diffuse interstitial prominence, likely pulmonary edema. Remote rib fractures. IMPRESSION: 1. Decreased RIGHT-sided pleural effusion status post thoracentesis. No significant pneumothorax. 2. Moderate LEFT pleural effusion with persistent LEFT retrocardiac opacity, likely atelectasis. Electronically Signed   By: Corean Salter M.D.   On: 05/01/2024 18:50   DG CHEST PORT 1 VIEW Result Date: 05/01/2024 EXAM: 1 VIEW(S) XRAY OF THE CHEST 05/01/2024 07:13:00 AM COMPARISON: Portable chest and chest CT both 04/29/2024. CLINICAL HISTORY: Pleural effusion. FINDINGS: LUNGS AND PLEURA: Moderate pleural effusions with overlying atelectasis and patchy airspace disease of the upper to mid lung regions. No pneumothorax. HEART AND MEDIASTINUM: Cardiomegaly. There is worsening central vascular prominence and central interstitial edema. Stable mediastinum with aortic atherosclerosis. BONES AND SOFT TISSUES: No acute osseous abnormality. IMPRESSION: 1. Moderate pleural effusions with overlying atelectasis and patchy airspace disease of the upper to mid lung regions. 2. Cardiomegaly with worsening central vascular prominence and central interstitial edema. Electronically signed by: Francis Quam  MD 05/01/2024 07:43  AM EST RP Workstation: [id number]   ECHOCARDIOGRAM LIMITED Result Date: 04/30/2024    ECHOCARDIOGRAM LIMITED REPORT   Patient Name:   MALEIAH DULA Date of Exam: 04/30/2024 Medical Rec #:  [id number]      Height:       63.0 in Accession #:    [id number]     Weight:       163.1 lb Date of Birth:  1940/08/12       BSA:          1.773 m Patient Age:    83 years       BP:           117/64 mmHg Patient Gender: F              HR:           110 bpm. Exam Location:  Inpatient Procedure: Limited Color Doppler, Cardiac Doppler and Limited Echo (Both            Spectral and Color Flow Doppler were utilized during procedure). Indications:    acute systolic chf  History:        Patient has prior history of Echocardiogram examinations, most                 recent 10/02/2022. CHF, Arrythmias:Atrial Fibrillation; Risk                 Factors:Sleep Apnea.  Sonographer:    Tinnie Barefoot RDCS Referring Phys: [id number] ROBERT DORRELL IMPRESSIONS  1. Left ventricular ejection fraction, by estimation, is >75%. The left ventricle has hyperdynamic function. The left ventricle has no regional wall motion abnormalities. Left ventricular diastolic parameters are indeterminate.  2. Right ventricular systolic function is normal. The right ventricular size is normal. There is mildly elevated pulmonary artery systolic pressure.  3. Left atrial size was severely dilated.  4. Right atrial size was mildly dilated.  5. Moderate pleural effusion in the left lateral region.  6. The mitral valve is normal in structure. Mild to moderate mitral valve regurgitation. No evidence of mitral stenosis.  7. The aortic valve is calcified. Aortic valve regurgitation is not visualized. Mild aortic valve stenosis.  8. The inferior vena cava is dilated in size with >50% respiratory variability, suggesting right atrial pressure of 8 mmHg. FINDINGS  Left Ventricle: Left ventricular ejection fraction, by estimation, is >75%. The left ventricle has hyperdynamic  function. The left ventricle has no regional wall motion abnormalities. The left ventricular internal cavity size was normal in size. There is no left ventricular hypertrophy. Left ventricular diastolic parameters are indeterminate. Left ventricular diastolic function could not be evaluated due to atrial fibrillation. Right Ventricle: The right ventricular size is normal. Right ventricular systolic function is normal. There is mildly elevated pulmonary artery systolic pressure. The tricuspid regurgitant velocity is 2.89 m/s, and with an assumed right atrial pressure of 8 mmHg, the estimated right ventricular systolic pressure is 41.4 mmHg. Left Atrium: Left atrial size was severely dilated. Right Atrium: Right atrial size was mildly dilated. Pericardium: There is no evidence of pericardial effusion. Mitral Valve: The mitral valve is normal in structure. Mild mitral annular calcification. Mild to moderate mitral valve regurgitation. No evidence of mitral valve stenosis. Tricuspid Valve: The tricuspid valve is normal in structure. Tricuspid valve regurgitation is mild . No evidence of tricuspid stenosis. Aortic Valve: The aortic valve is calcified. Aortic valve regurgitation is not visualized. Mild aortic stenosis is present. Aortic valve mean gradient  measures 12.2 mmHg. Aortic valve peak gradient measures 22.0 mmHg. Pulmonic Valve: The pulmonic valve was normal in structure. Pulmonic valve regurgitation is not visualized. No evidence of pulmonic stenosis. Aorta: The aortic root is normal in size and structure. Venous: The inferior vena cava is dilated in size with greater than 50% respiratory variability, suggesting right atrial pressure of 8 mmHg. IAS/Shunts: No atrial level shunt detected by color flow Doppler. Additional Comments: There is a moderate pleural effusion in the left lateral region. Spectral Doppler performed. Color Doppler performed.  LEFT VENTRICLE PLAX 2D LVIDd:         4.30 cm LVIDs:         2.60  cm  IVC IVC diam: 2.20 cm AORTIC VALVE AV Vmax:           234.50 cm/s AV Vmean:          160.250 cm/s AV VTI:            0.361 m AV Peak Grad:      22.0 mmHg AV Mean Grad:      12.2 mmHg LVOT Vmax:         98.25 cm/s LVOT Vmean:        63.900 cm/s LVOT VTI:          0.176 m LVOT/AV VTI ratio: 0.49  AORTA Ao Asc diam: 3.00 cm TRICUSPID VALVE TR Peak grad:   33.4 mmHg TR Vmax:        289.00 cm/s  SHUNTS Systemic VTI: 0.18 m Redell Shallow MD Electronically signed by Redell Shallow MD Signature Date/Time: 04/30/2024/1:57:13 PM    Final    CT CHEST WO CONTRAST Result Date: 04/29/2024 EXAM: CT CHEST WITHOUT CONTRAST 04/29/2024 07:00:00 PM TECHNIQUE: CT of the chest was performed without the administration of intravenous contrast. Multiplanar reformatted images are provided for review. Automated exposure control, iterative reconstruction, and/or weight based adjustment of the mA/kV was utilized to reduce the radiation dose to as low as reasonably achievable. COMPARISON: None available. CLINICAL HISTORY: Dyspnea, chronic, chest wall or pleura disease suspected. FINDINGS: MEDIASTINUM: Mild cardiomegaly with small pericardial effusion. Scattered coronary calcifications. The central airways are clear. Scattered calcified plaque in descending thoracic aorta and arch. Dilated central pulmonary arteries suggesting pulmonary hypertension. LYMPH NODES: Mildly prominent mediastinal nodes, likely reactive. No hilar or axillary lymphadenopathy. LUNGS AND PLEURA: Moderate bilateral pleural effusions. Associated lingular and bilateral lower lobe compressive atelectasis. Patchy ground-glass opacities in right upper lobe, favoring infection/inflammation over mild edema. Coarse interstitial markings in periphery of both lung bases, suggesting mild interstitial lung disease. No pneumothorax. SOFT TISSUES/BONES: Bilateral shoulder DJD (degenerative joint disease). No acute abnormality of the bones or soft tissues. UPPER ABDOMEN: Small  volume perihepatic ascites. Cirrhosis. IMPRESSION: 1. Patchy ground-glass opacities in the right upper lobe, favoring infection/inflammation over mild edema. 2. Moderate bilateral pleural effusions with associated lingular and bilateral lower lobe compressive atelectasis. 3. Mild cardiomegaly with small pericardial effusion. 4. Dilated central pulmonary arteries suggesting pulmonary hypertension. Electronically signed by: Pinkie Pebbles MD 04/29/2024 07:36 PM EST RP Workstation: [id number]   DG Chest Portable 1 View Result Date: 04/29/2024 EXAM: 1 VIEW(S) XRAY OF THE CHEST 04/29/2024 04:07:00 PM COMPARISON: 07/22/2022 CLINICAL HISTORY: SOB FINDINGS: LUNGS AND PLEURA: Small to moderate right and small left pleural effusions. Increased diffuse interstitial opacities. Hazy opacities at bilateral lung bases. No pneumothorax. HEART AND MEDIASTINUM: Cardiomegaly. Atherosclerotic calcifications. BONES AND SOFT TISSUES: No acute osseous abnormality. IMPRESSION: 1. Small to moderate right and small left pleural effusions.  2. Increased diffuse interstitial opacities and hazy opacities at the lung bases, which may reflect pulmonary edema. 3. Cardiomegaly. Electronically signed by: Dayne Hassell MD 04/29/2024 04:31 PM EST RP Workstation: [id number]    Microbiology: Results for orders placed or performed during the hospital encounter of 04/29/24  MRSA Next Gen by PCR, Nasal     Status: None   Collection Time: 04/30/24 12:50 PM   Specimen: Nasal Mucosa; Nasal Swab  Result Value Ref Range Status   MRSA by PCR Next Gen NOT DETECTED NOT DETECTED Final    Comment: (NOTE) The GeneXpert MRSA Assay (FDA approved for NASAL specimens only), is one component of a comprehensive MRSA colonization surveillance program. It is not intended to diagnose MRSA infection nor to guide or monitor treatment for MRSA infections. Test performance is not FDA approved in patients less than 44 years old. Performed at [facility], [address] [place], [place] [address]   Pleural fluid culture w Gram Stain     Status: None   Collection Time: 05/01/24  2:04 PM   Specimen: Pleural Fluid  Result Value Ref Range Status   Specimen Description PLEURAL  Final   Special Requests NONE  Final   Gram Stain NO WBC SEEN NO ORGANISMS SEEN   Final   Culture   Final    NO GROWTH 3 DAYS Performed at [facility], [address], [place], [place] [address]    Report Status 05/04/2024 FINAL  Final  Resp panel by RT-PCR (RSV, Flu A&B, Covid) Anterior Nasal Swab     Status: None   Collection Time: 05/05/24  6:43 PM   Specimen: Anterior Nasal Swab  Result Value Ref Range Status   SARS Coronavirus 2 by RT PCR NEGATIVE NEGATIVE Final   Influenza A by PCR NEGATIVE NEGATIVE Final   Influenza B by PCR NEGATIVE NEGATIVE Final    Comment: (NOTE) The Xpert Xpress SARS-CoV-2/FLU/RSV plus assay is intended as an aid in the diagnosis of influenza from Nasopharyngeal swab specimens and should not be used as a sole basis for treatment. Nasal washings and aspirates are unacceptable for Xpert Xpress SARS-CoV-2/FLU/RSV testing.  Fact Sheet for Patients: bloggercourse.com  Fact Sheet for Healthcare Providers: seriousbroker.it  This test is not yet approved or cleared by the [place]  FDA and has been authorized for detection and/or diagnosis of SARS-CoV-2 by FDA under an Emergency Use Authorization (EUA). This EUA will remain in effect (meaning this test can be used) for the duration of the COVID-19 declaration under Section 564(b)(1) of the Act, 21 U.S.C. section 360bbb-3(b)(1), unless the authorization is terminated or revoked.     Resp Syncytial Virus by PCR NEGATIVE NEGATIVE Final    Comment: (NOTE) Fact Sheet for Patients: bloggercourse.com  Fact Sheet for Healthcare Providers: seriousbroker.it  This test  is not yet approved or cleared by the [place]  FDA and has been authorized for detection and/or diagnosis of SARS-CoV-2 by FDA under an Emergency Use Authorization (EUA). This EUA will remain in effect (meaning this test can be used) for the duration of the COVID-19 declaration under Section 564(b)(1) of the Act, 21 U.S.C. section 360bbb-3(b)(1), unless the authorization is terminated or revoked.  Performed at [facility], [address], [place], [place] [address]     Labs: CBC: Recent Labs  Lab 05/09/24 0220 05/10/24 0222 05/11/24 0210 05/12/24 0234 05/14/24 0249  WBC 10.3 10.2 10.6* 11.2* 8.6  NEUTROABS 7.5  --  7.5 7.8* 5.2  HGB 11.8* 12.0 11.7* 11.0* 11.6*  HCT 36.8 37.3 36.6 34.7* 36.6  MCV 93.9 94.0 94.1 94.0 94.1  PLT 240 221 230 240 247   Basic Metabolic Panel: Recent Labs  Lab 05/08/24 0838 05/09/24 0220 05/10/24 0222 05/11/24 0210 05/12/24 0234 05/14/24 0249  NA 139 139 139 136 138 141  K 3.7 3.6 4.0 3.9 4.4 4.0  CL 96* 96* 96* 97* 100 101  CO2 35* 35* 38* 34* 32 33*  GLUCOSE 210* 105* 109* 108* 108* 94  BUN 21 22 19 17 22 17   CREATININE 1.23* 1.27* 1.13* 1.18* 1.23* 1.14*  CALCIUM  9.0 8.5* 8.9 8.7* 8.8* 8.8*  MG 2.0 2.0 2.1 2.1 2.1  --   PHOS  --   --   --   --  3.7  --    Liver Function Tests: Recent Labs  Lab 05/12/24 0234  AST 28  ALT 22  ALKPHOS 189*  BILITOT 0.6  PROT 6.2*  ALBUMIN 2.8*   CBG: No results for input(s): GLUCAP in the last 168 hours.  Discharge time spent: 41 minutes.  Discussed the plan with the patient's daughter before discharge.   Signed: Elgie Butter, MD [facility] 05/14/2024 "

## 2024-05-14 NOTE — TOC Transition Note (Signed)
 Transition of Care [facility]) - Discharge Note   Patient Details  Name: Carla Petersen MRN: [id number] Date of Birth: [birth year]  Transition of Care [facility]) CM/SW Contact:  Robynn Eileen Hoose, RN Phone Number: 05/14/2024, 12:16 PM   Clinical Narrative:   Secure message from floor nurse regarding family questions for discharge. Spoke with daughter discussed discharge planning needs and follow up care. Per previous RNCM notes, patient declined HH and wanted to do outpatient physical therapy at [facility].       Barriers to Discharge: Continued Medical Work up   Patient Goals and CMS Choice Patient states their goals for this hospitalization and ongoing recovery are:: return home CMS Medicare.gov Compare Post Acute Care list provided to:: Patient Choice offered to / list presented to : Patient      Discharge Placement                       Discharge Plan and Services Additional resources added to the After Visit Summary for     Discharge Planning Services: CM Consult                                 Social Drivers of Health (SDOH) Interventions SDOH Screenings   Food Insecurity: No Food Insecurity (04/30/2024)  Housing: Low Risk (04/30/2024)  Transportation Needs: No Transportation Needs (04/30/2024)  Utilities: Not At Risk (04/30/2024)  Social Connections: Moderately Isolated (04/30/2024)  Tobacco Use: Medium Risk (05/05/2024)     Readmission Risk Interventions     No data to display

## 2024-05-17 ENCOUNTER — Telehealth: Payer: Self-pay | Admitting: Cardiology

## 2024-05-17 ENCOUNTER — Other Ambulatory Visit: Payer: Self-pay | Admitting: Cardiology

## 2024-05-17 DIAGNOSIS — I4891 Unspecified atrial fibrillation: Secondary | ICD-10-CM

## 2024-05-17 NOTE — Telephone Encounter (Signed)
 Prescription refill request for Eliquis  received. Indication:afib Last office visit:12/25 Scr: 1.14  12/25 Age:83 Weight:70.5  kg  Prescription refilled

## 2024-05-17 NOTE — Telephone Encounter (Signed)
 Pt c/o swelling/edema: STAT if pt has developed SOB within 24 hours  If swelling, where is the swelling located? feet  How much weight have you gained and in what time span? 3 pounds since 12/12  Have you gained 2 pounds in a day or 5 pounds in a week? no  Do you have a log of your daily weights (if so, list)? no  Are you currently taking a fluid pill? yes  Are you currently SOB? no  Have you traveled recently in a car or plane for an extended period of time?  No  Please advise.

## 2024-05-18 NOTE — Telephone Encounter (Signed)
"  LVMTCB   "

## 2024-05-18 NOTE — Telephone Encounter (Signed)
 Spoke to pt. She is c/o swelling in both feet (from the ankle down), slightly puffier on right foot, that has gradually gotten worse since being d/c from the hospital this past Saturday. Both feet are slightly pink. Denies redness/warmth, denies SOB, denies CP, denies dizziness/lightheadedness. She was seen in the clinic on 04/29/24 by West, NP who advised ER admission. Pt states she was in the hospital for 16 Days, on IV diuresis and failed DCCV.   Advised pt to wear compression socks during the day and to elevate her feet. Also advised on watching her sodium intake. She does have compression socks at home. She has been watching her salt intake, and does not eat a lot, although she has had bread.   Her weights:  12/27- 162.2 lbs 12/28- 163.6 lbs. 12/29- 165 lbs. 12/30- 165 lbs. 12/31- 163.6 lbs.  Does not check BP's on a daily basis, but does have Pulse Ox meter, HR=80's. And spO2 WNL. All Cardiac meds reviewed in detail, and she is taking all as prescribed. On Torsemide  20 mg every other day.

## 2024-05-18 NOTE — Telephone Encounter (Signed)
 Spoke with patient and shared recommendation from Katlyn. Patient verbalized understanding and expressed appreciation for call.

## 2024-05-18 NOTE — Telephone Encounter (Signed)
 Patient returned RN's call.

## 2024-05-20 NOTE — Therapy (Signed)
 " OUTPATIENT PHYSICAL THERAPY LOWER EXTREMITY EVALUATION   Patient Name: Carla Petersen MRN: [id number] DOB:05/24/1940, 84 y.o., female Today's Date: 05/23/2024  END OF SESSION:  PT End of Session - 05/23/24 1554     Visit Number 1    Date for Recertification  07/18/24    Authorization Type Medicare/AARP    Progress Note Due on Visit 10    PT Start Time 1450    PT Stop Time 1537    PT Time Calculation (min) 47 min    Activity Tolerance Patient tolerated treatment well    Behavior During Therapy WFL for tasks assessed/performed          Past Medical History:  Diagnosis Date   CHF (congestive heart failure) (HCC)    Graves disease    Personal history of radiation therapy    Left Breast Cancer   Past Surgical History:  Procedure Laterality Date   BREAST EXCISIONAL BIOPSY Left    BREAST EXCISIONAL BIOPSY Left    BREAST LUMPECTOMY Left 1995   CARDIOVERSION N/A 05/05/2024   Procedure: CARDIOVERSION;  Surgeon: Santo Stanly LABOR, MD;  Location: [facility] INVASIVE CV LAB;  Service: Cardiovascular;  Laterality: N/A;   MASTECTOMY Left    PR THORACENTESIS NEEDLE/CATH PLEURA W/IMAGING  05/01/2024   TRANSESOPHAGEAL ECHOCARDIOGRAM (CATH LAB) N/A 05/05/2024   Procedure: TRANSESOPHAGEAL ECHOCARDIOGRAM;  Surgeon: Santo Stanly LABOR, MD;  Location: [facility] INVASIVE CV LAB;  Service: Cardiovascular;  Laterality: N/A;   Patient Active Problem List   Diagnosis Date Noted   Long term (current) use of anticoagulants 05/09/2024   Cough 05/09/2024   Long term current use of antiarrhythmic drug 05/08/2024   History of cardioversion 05/07/2024   Mixed hyperlipidemia 05/07/2024   Acute on chronic heart failure with preserved ejection fraction (HFpEF) (HCC) 05/07/2024   Hypokalemia 05/05/2024   Hypothyroidism 05/05/2024   Hypertension 05/05/2024   Pressure injury of skin 05/05/2024   Persistent atrial fibrillation (HCC) 05/05/2024   Prolonged QT interval 05/01/2024   Acute on chronic diastolic  heart failure (HCC) 87/86/7974   Acute respiratory failure with hypoxia (HCC) 04/30/2024   Atrial fibrillation with rapid ventricular response (HCC) 04/30/2024   Acute exacerbation of CHF (congestive heart failure) (HCC) 04/29/2024   Anticoagulant long-term use 03/27/2022   Otorrhea, right 03/27/2022   Sleep apnea in adult 12/18/2019   Hernia, inguinal 11/01/2019   Racing heart beat 09/17/2019   Laceration of right thumb without foreign body without damage to nail 06/03/2017   H/O mastoidectomy 08/02/2015   Mastoiditis of right side 08/02/2015   S/P hip replacement 10/03/2014   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 She had a bad cough that was due to lung congestion and it progressively got worse. She has been on 3L of O2 for a week. She recently started walking with a walker since her hospital admission. It is hard to navigate her house with it. She was in the hospital for 16 days. Limiting functional activities: pushing O2 machine with  walker (heavy), standing tolerance is limited PLOF  PERTINENT HISTORY: CHF; Graves Disease; Left breast lumpectomy with radiation; Hx Rt hip replacement; HTN PAIN:  Are you having pain? No  PRECAUTIONS: None  RED FLAGS: None   WEIGHT BEARING RESTRICTIONS: No  FALLS:  Has patient fallen in last 6 months? No  LIVING ENVIRONMENT: Lives with: lives with their spouse Lives in:  House/apartment Stairs: No Has following equipment at home: Single point cane, Environmental Consultant - 2 wheeled, Grab bars, and Ramped entry only uses cane in the car for when she goes to her daughter's house she has two steps to enter  OCCUPATION: Retired  PLOF: Independent, Independent with basic ADLs, Independent with household mobility without device, Independent with community mobility without device, Independent with gait, Independent with transfers, and Leisure: sewing and knitting, reading  PATIENT GOALS: I don't have any goals  NEXT MD VISIT: Cardiologist Feb 2nd  OBJECTIVE:  Note: Objective measures were completed at Evaluation unless otherwise noted.  DIAGNOSTIC FINDINGS: None  PATIENT SURVEYS:  ABC scale: The Activities-Specific Balance Confidence (ABC) Scale: 66.25% moderate level of physical functioning   COGNITION: Overall cognitive status: Within functional limits for tasks assessed      POSTURE: rounded shoulders, forward head, flexed trunk , and bilateral flexed knees    LOWER EXTREMITY ROM: WFL bilateral      LOWER EXTREMITY MMT:  MMT Right eval Left eval  Hip flexion 4 4-  Hip extension    Hip abduction 4 4-  Hip adduction    Hip internal rotation    Hip external rotation    Knee flexion 4 4  Knee extension 4 4  Ankle dorsiflexion    Ankle plantarflexion    Ankle inversion    Ankle eversion     (Blank rows = not tested)   FUNCTIONAL TESTS:  5 times sit to stand: 14.51 sec with UE support; uncontrolled descent Timed up and go (TUG): 20.74sec  GAIT: Distance walked: 29ft Assistive device utilized: Environmental Consultant - 2 wheeled Comments: Decreased cadence, bilateral flexed knees, increased trunk flexion                                                                                                                                TREATMENT DATE: 05/23/2024 Initial Evaluation & HEP created  Correct distance when using walker  PATIENT EDUCATION:  Education  details: PT eval findings, anticipated POC, progress with PT, and initial HEP  Person educated: Patient Education method: Explanation, Demonstration, and Handouts Education comprehension: verbalized understanding, returned demonstration, and needs further education  HOME EXERCISE PROGRAM: Access Code: [email or web address] URL: [email or web address] Date: 05/23/2024 Prepared by: Kristeen Sar  Exercises - Heel Raises with Counter Support  - 1 x daily - 7 x weekly - 1-2 sets - 10 reps - Standing March with Counter Support  - 1 x daily - 7 x weekly - 1 sets - 10 reps - Sit to Stand with Armchair  -  1 x daily - 7 x weekly - 2 sets - 5 reps - Seated Long Arc Quad  - 1 x daily - 7 x weekly - 1 sets - 10 reps - 3-4 hold  ASSESSMENT:  CLINICAL IMPRESSION: Patient is a 84 y.o. female who was seen today for physical therapy evaluation and treatment for decondition due to CHF. Carla Petersen presents after being hospitalized for 16 days due to CHF and Afib. She does not use a walker PLOF but since discharging home she has been walking with a walker and is on 3L on O2. She reports at baseline her standing tolerance is not that long. She normally needs a seated rest break when cooking more elaborate meals. Based on evaluation noted muscle weakness, poor posture, and increased falls risk. Educated patient on safe use of her walker and proper distance. Patient will benefit from skilled PT to address the below impairments and improve overall function.   OBJECTIVE IMPAIRMENTS: Abnormal gait, cardiopulmonary status limiting activity, decreased activity tolerance, decreased balance, decreased endurance, decreased mobility, difficulty walking, decreased ROM, decreased strength, decreased safety awareness, hypomobility, impaired flexibility, and postural dysfunction.   ACTIVITY LIMITATIONS: lifting, bending, squatting, stairs, reach over head, hygiene/grooming, and locomotion level  PARTICIPATION LIMITATIONS: meal  prep, cleaning, laundry, interpersonal relationship, shopping, and community activity  PERSONAL FACTORS: Age, Fitness, and 3+ comorbidities: CHF; Graves Disease; Left breast lumpectomy with radiation; HTN are also affecting patient's functional outcome.   REHAB POTENTIAL: Good  CLINICAL DECISION MAKING: Evolving/moderate complexity  EVALUATION COMPLEXITY: Moderate   GOALS: Goals reviewed with patient? Yes  SHORT TERM GOALS: Target date: 06/20/2024  Patient will be independent with initial HEP. Baseline:  Goal status: INITIAL  2.  Patient will report > or = to 30% improvement in ability to perform functional activities (laundry, cooking, and bathing )since starting PT. Baseline:  Goal status: INITIAL   3.  Patient will be able to participate in a to establish baseline for test. Baseline:  Goal status: INITIAL  4.  Patient will demonstrate control when performing sit to stand due to improved LE strength. Baseline:  Goal status: INITIAL    LONG TERM GOALS: Target date: 07/18/2024  Patient will demonstrate independence in advanced HEP. Baseline:  Goal status: INITIAL  2.  Patient will report > or = to 60% improvement in ability to perform functional activities (laundry, cooking, and bathing )since starting PT. Baseline:  Goal status: INITIAL  3.  Patient will perform 5STS in < or = to 11 sec due to improved functional mobility and LE strength. Baseline: 14.51 Goal status: INITIAL   4.  Patient will perform TUG in < or = to 15 sec to decrease falls risk. Baseline: 20.74sec Goal status: INITIAL  5.  Patient will ambulate 50-60% of age expected distance during to improve community negotiation. Baseline:  Goal status: INITIAL     PLAN:  PT FREQUENCY: 1-2x/week  PT DURATION: 8 weeks  PLANNED INTERVENTIONS: 97164- PT Re-evaluation, 97110-Therapeutic exercises, 97530- Therapeutic activity, 97112- Neuromuscular re-education, 97535- Self Care, [id number]- Manual  therapy, [id number]- Gait training, [phone]- Canalith repositioning, [id number]- Aquatic Therapy, [phone]- Electrical stimulation (unattended), [id number]- Electrical stimulation (manual), [id number]- Vasopneumatic device, [id number]- Ultrasound, [id number]- Traction (mechanical), [id number]- Ionotophoresis 4mg /ml Dexamethasone, 79439 (1-2 muscles), [id number] (3+ muscles)- Dry Needling, Patient/Family education, Balance training, Stair training, Joint mobilization, Joint manipulation, Spinal manipulation, Spinal mobilization, Vestibular training, Cryotherapy, and Moist heat  PLAN FOR NEXT SESSION: Review HEP; ; standing tolerance; LE strengthening and balance   Kristeen Sar, PT,  DPT 05/23/2024 3:55 PM [facility] [address], [address] [place], [place] [address] Phone # [phone] Fax [phone]   "

## 2024-05-23 ENCOUNTER — Other Ambulatory Visit: Payer: Self-pay

## 2024-05-23 ENCOUNTER — Encounter: Payer: Self-pay | Admitting: Physical Therapy

## 2024-05-23 ENCOUNTER — Ambulatory Visit: Attending: Family Medicine | Admitting: Physical Therapy

## 2024-05-23 DIAGNOSIS — I5033 Acute on chronic diastolic (congestive) heart failure: Secondary | ICD-10-CM | POA: Diagnosis present

## 2024-05-23 DIAGNOSIS — Z5189 Encounter for other specified aftercare: Secondary | ICD-10-CM | POA: Diagnosis not present

## 2024-05-23 DIAGNOSIS — M6281 Muscle weakness (generalized): Secondary | ICD-10-CM

## 2024-05-23 DIAGNOSIS — R293 Abnormal posture: Secondary | ICD-10-CM

## 2024-05-27 ENCOUNTER — Ambulatory Visit (HOSPITAL_COMMUNITY): Admitting: Physician Assistant

## 2024-05-30 ENCOUNTER — Ambulatory Visit: Admitting: Physical Therapy

## 2024-05-30 ENCOUNTER — Encounter: Payer: Self-pay | Admitting: Physical Therapy

## 2024-05-30 DIAGNOSIS — R293 Abnormal posture: Secondary | ICD-10-CM

## 2024-05-30 DIAGNOSIS — M6281 Muscle weakness (generalized): Secondary | ICD-10-CM

## 2024-05-30 DIAGNOSIS — I5033 Acute on chronic diastolic (congestive) heart failure: Secondary | ICD-10-CM | POA: Diagnosis not present

## 2024-05-30 NOTE — Therapy (Signed)
 " OUTPATIENT PHYSICAL THERAPY LOWER EXTREMITY TREATMENT   Patient Name: Carla Petersen MRN: [id number] DOB:Feb 01, 1941, 84 y.o., female Today's Date: 05/30/2024  END OF SESSION:  PT End of Session - 05/30/24 1151     Visit Number 2    Date for Recertification  07/18/24    Authorization Type Medicare/AARP    Progress Note Due on Visit 10    PT Start Time 1151    PT Stop Time 1232    PT Time Calculation (min) 41 min    Equipment Utilized During Treatment Oxygen;Gait belt    Activity Tolerance Patient tolerated treatment well    Behavior During Therapy WFL for tasks assessed/performed          Past Medical History:  Diagnosis Date   CHF (congestive heart failure) (HCC)    Graves disease    Personal history of radiation therapy    Left Breast Cancer   Past Surgical History:  Procedure Laterality Date   BREAST EXCISIONAL BIOPSY Left    BREAST EXCISIONAL BIOPSY Left    BREAST LUMPECTOMY Left 1995   CARDIOVERSION N/A 05/05/2024   Procedure: CARDIOVERSION;  Surgeon: Santo Stanly LABOR, MD;  Location: [facility];  Service: Cardiovascular;  Laterality: N/A;   MASTECTOMY Left    PR THORACENTESIS NEEDLE/CATH PLEURA W/IMAGING  05/01/2024   TRANSESOPHAGEAL ECHOCARDIOGRAM (CATH LAB) N/A 05/05/2024   Procedure: TRANSESOPHAGEAL ECHOCARDIOGRAM;  Surgeon: Santo Stanly LABOR, MD;  Location: [facility];  Service: Cardiovascular;  Laterality: N/A;   Patient Active Problem List   Diagnosis Date Noted   Long term (current) use of anticoagulants 05/09/2024   Cough 05/09/2024   Long term current use of antiarrhythmic drug 05/08/2024   History of cardioversion 05/07/2024   Mixed hyperlipidemia 05/07/2024   Acute on chronic heart failure with preserved ejection fraction (HFpEF) (HCC) 05/07/2024   Hypokalemia 05/05/2024   Hypothyroidism 05/05/2024   Hypertension 05/05/2024   Pressure injury of skin 05/05/2024   Persistent atrial fibrillation (HCC) 05/05/2024    Prolonged QT interval 05/01/2024   Acute on chronic diastolic heart failure (HCC) 04/30/2024   Acute respiratory failure with hypoxia (HCC) 04/30/2024   Atrial fibrillation with rapid ventricular response (HCC) 04/30/2024   Acute exacerbation of CHF (congestive heart failure) (HCC) 04/29/2024   Anticoagulant long-term use 03/27/2022   Otorrhea, right 03/27/2022   Sleep apnea in adult 12/18/2019   Hernia, inguinal 11/01/2019   Racing heart beat 09/17/2019   Laceration of right thumb without foreign body without damage to nail 06/03/2017   H/O mastoidectomy 08/02/2015   Mastoiditis of right side 08/02/2015   S/P hip replacement 10/03/2014   Status post total replacement of left hip 10/03/2014   Multiple traumatic injuries 07/17/2014    PCP: Ransom Other, MD   REFERRING PROVIDER: Perri LABOR Meliton Mickey., MD  REFERRING DIAG: I50.33 (ICD-10-CM) - Acute on chronic diastolic congestive heart failure (HCC) I50.33 (ICD-10-CM) - Acute on chronic diastolic heart failure (HCC)  THERAPY DIAG:  Muscle weakness (generalized)  Abnormal posture  Rationale for Evaluation and Treatment: Rehabilitation  ONSET DATE: Chronic flare up November 2025  SUBJECTIVE:   SUBJECTIVE STATEMENT: No new complaints  PERTINENT HISTORY: CHF; Graves Disease; Left breast lumpectomy with radiation; Hx Rt hip replacement; HTN PAIN:  Are you having pain? No  PRECAUTIONS: None  RED FLAGS: None   WEIGHT BEARING RESTRICTIONS: No  FALLS:  Has patient fallen in last 6 months? No  LIVING ENVIRONMENT: Lives with: lives with their spouse Lives in: House/apartment Stairs:  No Has following equipment at home: Single point cane, Walker - 2 wheeled, Grab bars, and Ramped entry only uses cane in the car for when she goes to her daughter's house she has two steps to enter  OCCUPATION: Retired  PLOF: Independent, Independent with basic ADLs, Independent with household mobility without device, Independent with  community mobility without device, Independent with gait, Independent with transfers, and Leisure: sewing and knitting, reading  PATIENT GOALS: I don't have any goals  NEXT MD VISIT: Cardiologist Feb 2nd  OBJECTIVE:  Note: Objective measures were completed at Evaluation unless otherwise noted.  DIAGNOSTIC FINDINGS: None  PATIENT SURVEYS:  ABC scale: The Activities-Specific Balance Confidence (ABC) Scale: 66.25% moderate level of physical functioning   COGNITION: Overall cognitive status: Within functional limits for tasks assessed      POSTURE: rounded shoulders, forward head, flexed trunk , and bilateral flexed knees    LOWER EXTREMITY ROM: WFL bilateral      LOWER EXTREMITY MMT:  MMT Right eval Left eval  Hip flexion 4 4-  Hip extension    Hip abduction 4 4-  Hip adduction    Hip internal rotation    Hip external rotation    Knee flexion 4 4  Knee extension 4 4  Ankle dorsiflexion    Ankle plantarflexion    Ankle inversion    Ankle eversion     (Blank rows = not tested)   FUNCTIONAL TESTS:  5 times sit to stand: 14.51 sec with UE support; uncontrolled descent Timed up and go (TUG): 20.74sec  GAIT: Distance walked: 35ft Assistive device utilized: Environmental Consultant - 2 wheeled Comments: Decreased cadence, bilateral flexed knees, increased trunk flexion                                                                                                                                TREATMENT DATE:  05/30/24 Sit to stands x 10 94% O2, PR 134 bpm; HR 138 bpm x 2 min, 124 bpm x 2 min Seated LAQ x 10 B Seated march 2x 10 HR 138 bpm then proceeds to decrease - when pt was talking increased again to 144 bpm quickly decreased to 112 with no talking. Seated heel raises x 20 up to 124 bpm  rapidly drops to 87 bpm    05/23/2024 Initial Evaluation & HEP created  Correct distance when using walker  PATIENT EDUCATION:  Education details: PT eval findings, anticipated POC,  progress with PT, and initial HEP  Person educated: Patient Education method: Explanation, Demonstration, and Handouts Education comprehension: verbalized understanding, returned demonstration, and needs further education  HOME EXERCISE PROGRAM: Access Code: [email or web address] URL: [email or web address] Date: 05/23/2024 Prepared by: Kristeen Sar  Exercises - Heel Raises with Counter Support  - 1 x daily - 7 x weekly - 1-2 sets - 10 reps - Standing March with Counter Support  - 1 x daily - 7 x weekly - 1 sets - 10 reps -  Sit to Stand with Armchair  - 1 x daily - 7 x weekly - 2 sets - 5 reps - Seated Long Arc Quad  - 1 x daily - 7 x weekly - 1 sets - 10 reps - 3-4 hold  ASSESSMENT:  CLINICAL IMPRESSION: Patient able to complete 532 feet during a without difficulty today.  Walker was adjusted to correct height. She needed intermittent cues to stay upright in the walker. Once exercises were started patients PR became very erratic. O2 sats remained WNL. Pt monitored throughout session. All exercises were completed in sitting after sit to stands. No increase in HR occurred with walking today. Patient is attending an Afib clinic tomorrow and advised to ask them if exercise is safe to continue as meds don't seem to be regulating the HR. Patient returns to PT Wed and PT will f/u with MD as needed.    OBJECTIVE IMPAIRMENTS: Abnormal gait, cardiopulmonary status limiting activity, decreased activity tolerance, decreased balance, decreased endurance, decreased mobility, difficulty walking, decreased ROM, decreased strength, decreased safety awareness, hypomobility, impaired flexibility, and postural dysfunction.   ACTIVITY LIMITATIONS: lifting, bending, squatting, stairs, reach over head, hygiene/grooming, and locomotion level  PARTICIPATION LIMITATIONS: meal prep, cleaning, laundry, interpersonal relationship, shopping, and community activity  PERSONAL FACTORS: Age, Fitness, and 3+  comorbidities: CHF; Graves Disease; Left breast lumpectomy with radiation; HTN are also affecting patient's functional outcome.   REHAB POTENTIAL: Good  CLINICAL DECISION MAKING: Evolving/moderate complexity  EVALUATION COMPLEXITY: Moderate   GOALS: Goals reviewed with patient? Yes  SHORT TERM GOALS: Target date: 06/20/2024  Patient will be independent with initial HEP. Baseline:  Goal status: INITIAL  2.  Patient will report > or = to 30% improvement in ability to perform functional activities (laundry, cooking, and bathing )since starting PT. Baseline:  Goal status: INITIAL   3.  Patient will be able to participate in a to establish baseline for test. Baseline:  Goal status: INITIAL  4.  Patient will demonstrate control when performing sit to stand due to improved LE strength. Baseline:  Goal status: INITIAL    LONG TERM GOALS: Target date: 07/18/2024  Patient will demonstrate independence in advanced HEP. Baseline:  Goal status: INITIAL  2.  Patient will report > or = to 60% improvement in ability to perform functional activities (laundry, cooking, and bathing )since starting PT. Baseline:  Goal status: INITIAL  3.  Patient will perform 5STS in < or = to 11 sec due to improved functional mobility and LE strength. Baseline: 14.51 Goal status: INITIAL   4.  Patient will perform TUG in < or = to 15 sec to decrease falls risk. Baseline: 20.74sec Goal status: INITIAL  5.  Patient will ambulate 50-60% of age expected distance during to improve community negotiation. Baseline:  Goal status: INITIAL     PLAN:  PT FREQUENCY: 1-2x/week  PT DURATION: 8 weeks  PLANNED INTERVENTIONS: [id number]- PT Re-evaluation, 97110-Therapeutic exercises, 97530- Therapeutic activity, [id number]- Neuromuscular re-education, 97535- Self Care, [id number]- Manual therapy, [id number]- Gait training, [phone]- Canalith repositioning, [id number]- Aquatic Therapy, [phone]- Electrical stimulation (unattended),  [phone]- Electrical stimulation (manual), [id number]- Vasopneumatic device, [id number]- Ultrasound, [id number]- Traction (mechanical), [id number]- Ionotophoresis 4mg /ml Dexamethasone, [id number] (1-2 muscles), [id number] (3+ muscles)- Dry Needling, Patient/Family education, Balance training, Stair training, Joint mobilization, Joint manipulation, Spinal manipulation, Spinal mobilization, Vestibular training, Cryotherapy, and Moist heat  PLAN FOR NEXT SESSION: Monitor HR at all times, standing tolerance; LE strengthening and balance   Mliss Cummins, PT  05/30/2024 1:19 PM [facility] [address] [place], [place] [address] Phone # [phone] Fax [phone]   "

## 2024-05-31 ENCOUNTER — Other Ambulatory Visit: Payer: Self-pay | Admitting: Internal Medicine

## 2024-05-31 ENCOUNTER — Ambulatory Visit (HOSPITAL_COMMUNITY)
Admission: RE | Admit: 2024-05-31 | Discharge: 2024-05-31 | Disposition: A | Discharge status: Home / Self Care | Source: Ambulatory Visit | Attending: Physician Assistant | Admitting: Physician Assistant

## 2024-05-31 VITALS — BP 130/56 | HR 80 | Ht 63.0 in | Wt 166.0 lb

## 2024-05-31 DIAGNOSIS — I4819 Other persistent atrial fibrillation: Secondary | ICD-10-CM | POA: Insufficient documentation

## 2024-05-31 DIAGNOSIS — Z79899 Other long term (current) drug therapy: Secondary | ICD-10-CM | POA: Insufficient documentation

## 2024-05-31 DIAGNOSIS — Z5181 Encounter for therapeutic drug level monitoring: Secondary | ICD-10-CM | POA: Insufficient documentation

## 2024-05-31 DIAGNOSIS — Z1231 Encounter for screening mammogram for malignant neoplasm of breast: Secondary | ICD-10-CM

## 2024-05-31 DIAGNOSIS — I4891 Unspecified atrial fibrillation: Secondary | ICD-10-CM | POA: Insufficient documentation

## 2024-05-31 DIAGNOSIS — D6869 Other thrombophilia: Secondary | ICD-10-CM | POA: Diagnosis present

## 2024-05-31 NOTE — Progress Notes (Signed)
 "   Primary Care Physician: Ransom Other, MD Primary Cardiologist: Lonni LITTIE Nanas, MD Electrophysiologist: Donnice DELENA Primus, MD  Referring Physician: Dr Primus Carla Petersen is a 84 y.o. female with a history of Grave's disease, HLD, breast cancer, CHF, atrial fibrillation who presents for follow up in the [facility]. She initially presented on 04/12/2024 to [facility] with shortness of breath and palpitations and was found to be in AF/RVR with ventricular rates in the 140s. She was hypoxic to 83% on admission and CXR showed moderate right sided pleural effusion and a small left-sided pleural effusion with a negative CT for pulmonary embolism. She was started on antibiotics and diuresed and underwent unsuccessful DCCV on 04/18/2024. She was then started on amiodarone  gtt with conversion to p.o. load. She was hospitalized again 04/29/24 shortness of breath, hypoxia, and lower extremity edema. She had TEE/DCCV on 05/05/24 with immediate conversion back to AF. She later chemically converted. Patient is on Eliquis  for stroke prevention.    Patient presents today for follow up for atrial fibrillation and amiodarone  monitoring. She remains in SR today and feels reasonably well. She has not had any interim symptoms of afib. She denies increased weight or lower extremity edema. She has started cardiac rehab.   Today, she denies symptoms of palpitations, chest pain, orthopnea, PND, lower extremity edema, dizziness, presyncope, syncope, snoring, daytime somnolence, bleeding, or neurologic sequela. The patient is tolerating medications without difficulties and is otherwise without complaint today.    Atrial Fibrillation Risk Factors:  she does not have symptoms or diagnosis of sleep apnea. she does not have a history of rheumatic fever.   Atrial Fibrillation Management history:  Previous antiarrhythmic drugs: amiodarone   Previous cardioversions:  04/18/24, 05/05/24 Previous ablations: none Anticoagulation history: Eliquis   ROS- All systems are reviewed and negative except as per the HPI above.  Past Medical History:  Diagnosis Date   CHF (congestive heart failure) (HCC)    Graves disease    Personal history of radiation therapy    Left Breast Cancer    Current Outpatient Medications  Medication Sig Dispense Refill   acetaminophen  (TYLENOL ) 500 MG tablet Take 500 mg by mouth daily as needed. PRN (Patient taking differently: Take 500 mg by mouth as needed. PRN)     ALPRAZolam (XANAX) 0.5 MG tablet Take 0.5 mg by mouth daily as needed.     amiodarone  (PACERONE ) 200 MG tablet Take 200 mg by mouth 2 (two) times daily.     atorvastatin  (LIPITOR) 20 MG tablet TAKE 1 TABLET BY MOUTH DAILY 90 tablet 3   calcium  carbonate (OS-CAL) 1250 (500 Ca) MG chewable tablet Chew 1 tablet by mouth daily.     diltiazem  (CARDIZEM  CD) 180 MG 24 hr capsule Take 1 capsule (180 mg total) by mouth daily. 90 capsule 3   ELIQUIS  5 MG TABS tablet TAKE 1 TABLET BY MOUTH 2 TIMES A DAY 60 tablet 5   ferrous sulfate 325 (65 FE) MG EC tablet Take 325 mg by mouth daily with breakfast.     levothyroxine  (SYNTHROID , LEVOTHROID) 25 MCG tablet Take 25 mcg by mouth daily.     loratadine  (CLARITIN ) 10 MG tablet Take 1 tablet (10 mg total) by mouth daily.     midodrine  (PROAMATINE ) 5 MG tablet Take 1 tablet (5 mg total) by mouth 3 (three) times daily with meals. 270 tablet 0   Multiple Vitamins-Minerals (PRESERVISION AREDS 2+MULTI VIT) CAPS Take 1 capsule by mouth daily.  Omega-3 Fatty Acids (FISH OIL CONCENTRATE) 300 MG CAPS Take 1 capsule by mouth daily. (Patient taking differently: Take 350 mg by mouth daily.)     pantoprazole  (PROTONIX ) 40 MG tablet Take 1 tablet (40 mg total) by mouth daily at 6 (six) AM. 30 tablet 0   potassium chloride  SA (KLOR-CON  M) 20 MEQ tablet Take 2 tablets (40 mEq total) by mouth daily. 30 tablet 1   torsemide  (DEMADEX ) 20 MG tablet Take 1  tablet (20 mg total) by mouth every other day. 30 tablet 3   Vitamin D, Cholecalciferol, 25 MCG (1000 UT) CAPS Take 1 capsule by mouth daily.     No current facility-administered medications for this encounter.    Physical Exam: BP (!) 130/56   Pulse 80   Ht 5' 3 (1.6 m)   Wt 75.3 kg   LMP 07/07/1983   BMI 29.41 kg/m   GEN: Well nourished, well developed in no acute distress CARDIAC: Regular rate and rhythm, no murmurs, rubs, gallops RESPIRATORY:  Clear to auscultation without rales, wheezing or rhonchi. Nasal canula O2.   ABDOMEN: Soft, non-tender, non-distended EXTREMITIES:  No edema; No deformity   Wt Readings from Last 3 Encounters:  05/31/24 75.3 kg  05/14/24 70.5 kg  04/29/24 81.8 kg     EKG Interpretation Date/Time:  Tuesday May 31 2024 14:41:32 EST Ventricular Rate:  80 PR Interval:  146 QRS Duration:  90 QT Interval:  382 QTC Calculation: 440 R Axis:   89  Text Interpretation: Normal sinus rhythm Normal ECG When compared with ECG of 10-May-2024 09:34, Sinus rhythm has replaced Atrial fibrillation Confirmed by [place] Perra ([id number]) on 05/31/2024 2:57:07 PM    Echo 04/30/24 demonstrated   1. Left ventricular ejection fraction, by estimation, is >75%. The left  ventricle has hyperdynamic function. The left ventricle has no regional  wall motion abnormalities. Left ventricular diastolic parameters are  indeterminate.   2. Right ventricular systolic function is normal. The right ventricular  size is normal. There is mildly elevated pulmonary artery systolic  pressure.   3. Left atrial size was severely dilated.   4. Right atrial size was mildly dilated.   5. Moderate pleural effusion in the left lateral region.   6. The mitral valve is normal in structure. Mild to moderate mitral valve  regurgitation. No evidence of mitral stenosis.   7. The aortic valve is calcified. Aortic valve regurgitation is not  visualized. Mild aortic valve stenosis.   8. The  inferior vena cava is dilated in size with >50% respiratory  variability, suggesting right atrial pressure of 8 mmHg.     CHA2DS2-VASc Score = 5  The patient's score is based upon: CHF History: 1 HTN History: 1 Diabetes History: 0 Stroke History: 0 Vascular Disease History: 0 Age Score: 2 Gender Score: 1       ASSESSMENT AND PLAN: Persistent Atrial Fibrillation (ICD10:  I48.19) The patient's CHA2DS2-VASc score is 5, indicating a 7.2% annual risk of stroke.   Patient in SR today.  Rhythm control options are limited.  Continue amiodarone  200 mg BID for now. If she maintains SR, can consider decreasing to once daily.  May need AVN+PPM if she fails amiodarone .  Continue diltiazem  180 mg daily Continue Eliquis  5 mg BID  Secondary Hypercoagulable State (ICD10:  D68.69) The patient is at significant risk for stroke/thromboembolism based upon her CHA2DS2-VASc Score of 5.  Continue Apixaban  (Eliquis ). No bleeding issues.   High Risk Medication Monitoring (ICD 10: [id number]) Patient requires  ongoing monitoring for anti-arrhythmic medication which has the potential to cause life threatening arrhythmias. Intervals on ECG acceptable for amiodarone  monitoring. Check cmet/TSH at next follow up.   Chronic HFpEF EF 75% GDMT per primary cardiology team Fluid status appears stable today   Follow up in the AF clinic in 3 months.    [facility] [address] [place], [place] [address] [phone] "

## 2024-06-01 ENCOUNTER — Encounter: Payer: Self-pay | Admitting: Physical Therapy

## 2024-06-01 ENCOUNTER — Ambulatory Visit: Admitting: Physical Therapy

## 2024-06-01 DIAGNOSIS — R293 Abnormal posture: Secondary | ICD-10-CM

## 2024-06-01 DIAGNOSIS — I5033 Acute on chronic diastolic (congestive) heart failure: Secondary | ICD-10-CM | POA: Diagnosis not present

## 2024-06-01 DIAGNOSIS — M6281 Muscle weakness (generalized): Secondary | ICD-10-CM

## 2024-06-01 NOTE — Therapy (Signed)
 " OUTPATIENT PHYSICAL THERAPY LOWER EXTREMITY TREATMENT   Patient Name: Carla Petersen MRN: [id number] DOB:01/07/1941, 84 y.o., female Today's Date: 06/01/2024  END OF SESSION:  PT End of Session - 06/01/24 1403     Visit Number 3    Date for Recertification  07/18/24    Authorization Type Medicare/AARP    Progress Note Due on Visit 10    PT Start Time 1403    PT Stop Time 1445    PT Time Calculation (min) 42 min    Equipment Utilized During Treatment Oxygen;Gait belt    Activity Tolerance Patient tolerated treatment well    Behavior During Therapy WFL for tasks assessed/performed           Past Medical History:  Diagnosis Date   CHF (congestive heart failure) (HCC)    Graves disease    Personal history of radiation therapy    Left Breast Cancer   Past Surgical History:  Procedure Laterality Date   BREAST EXCISIONAL BIOPSY Left    BREAST EXCISIONAL BIOPSY Left    BREAST LUMPECTOMY Left 1995   CARDIOVERSION N/A 05/05/2024   Procedure: CARDIOVERSION;  Surgeon: Santo Stanly LABOR, MD;  Location: [facility];  Service: Cardiovascular;  Laterality: N/A;   MASTECTOMY Left    PR THORACENTESIS NEEDLE/CATH PLEURA W/IMAGING  05/01/2024   TRANSESOPHAGEAL ECHOCARDIOGRAM (CATH LAB) N/A 05/05/2024   Procedure: TRANSESOPHAGEAL ECHOCARDIOGRAM;  Surgeon: Santo Stanly LABOR, MD;  Location: [facility];  Service: Cardiovascular;  Laterality: N/A;   Patient Active Problem List   Diagnosis Date Noted   Long term (current) use of anticoagulants 05/09/2024   Cough 05/09/2024   Long term current use of antiarrhythmic drug 05/08/2024   History of cardioversion 05/07/2024   Mixed hyperlipidemia 05/07/2024   Acute on chronic heart failure with preserved ejection fraction (HFpEF) (HCC) 05/07/2024   Hypokalemia 05/05/2024   Hypothyroidism 05/05/2024   Hypertension 05/05/2024   Pressure injury of skin 05/05/2024   Persistent atrial fibrillation (HCC) 05/05/2024    Prolonged QT interval 05/01/2024   Acute on chronic diastolic heart failure (HCC) 04/30/2024   Acute respiratory failure with hypoxia (HCC) 04/30/2024   Atrial fibrillation with rapid ventricular response (HCC) 04/30/2024   Acute exacerbation of CHF (congestive heart failure) (HCC) 04/29/2024   Anticoagulant long-term use 03/27/2022   Otorrhea, right 03/27/2022   Sleep apnea in adult 12/18/2019   Hernia, inguinal 11/01/2019   Racing heart beat 09/17/2019   Laceration of right thumb without foreign body without damage to nail 06/03/2017   H/O mastoidectomy 08/02/2015   Mastoiditis of right side 08/02/2015   S/P hip replacement 10/03/2014   Status post total replacement of left hip 10/03/2014   Multiple traumatic injuries 07/17/2014    PCP: Ransom Other, MD   REFERRING PROVIDER: Perri LABOR Meliton Mickey., MD  REFERRING DIAG: I50.33 (ICD-10-CM) - Acute on chronic diastolic congestive heart failure (HCC) I50.33 (ICD-10-CM) - Acute on chronic diastolic heart failure (HCC)  THERAPY DIAG:  Muscle weakness (generalized)  Abnormal posture  Rationale for Evaluation and Treatment: Rehabilitation  ONSET DATE: Chronic flare up November 2025  SUBJECTIVE:   SUBJECTIVE STATEMENT: No new complaints.  PA said I can try going up to 4L of oxygen while exercising to see if that helps my response/HR during exercise.  PERTINENT HISTORY: CHF; Graves Disease; Left breast lumpectomy with radiation; Hx Rt hip replacement; HTN PAIN:  Are you having pain? No  PRECAUTIONS: None  RED FLAGS: None   WEIGHT BEARING RESTRICTIONS: No  FALLS:  Has patient fallen in last 6 months? No  LIVING ENVIRONMENT: Lives with: lives with their spouse Lives in: House/apartment Stairs: No Has following equipment at home: Single point cane, Environmental Consultant - 2 wheeled, Grab bars, and Ramped entry only uses cane in the car for when she goes to her daughter's house she has two steps to enter  OCCUPATION: Retired  PLOF:  Independent, Independent with basic ADLs, Independent with household mobility without device, Independent with community mobility without device, Independent with gait, Independent with transfers, and Leisure: sewing and knitting, reading  PATIENT GOALS: I don't have any goals  NEXT MD VISIT: Cardiologist Feb 2nd  OBJECTIVE:  Note: Objective measures were completed at Evaluation unless otherwise noted.  DIAGNOSTIC FINDINGS: None  PATIENT SURVEYS:  ABC scale: The Activities-Specific Balance Confidence (ABC) Scale: 66.25% moderate level of physical functioning   COGNITION: Overall cognitive status: Within functional limits for tasks assessed      POSTURE: rounded shoulders, forward head, flexed trunk , and bilateral flexed knees    LOWER EXTREMITY ROM: WFL bilateral      LOWER EXTREMITY MMT:  MMT Right eval Left eval  Hip flexion 4 4-  Hip extension    Hip abduction 4 4-  Hip adduction    Hip internal rotation    Hip external rotation    Knee flexion 4 4  Knee extension 4 4  Ankle dorsiflexion    Ankle plantarflexion    Ankle inversion    Ankle eversion     (Blank rows = not tested)   FUNCTIONAL TESTS:  5 times sit to stand: 14.51 sec with UE support; uncontrolled descent Timed up and go (TUG): 20.74sec  GAIT: Distance walked: 6ft Assistive device utilized: Environmental Consultant - 2 wheeled Comments: Decreased cadence, bilateral flexed knees, increased trunk flexion                                                                                                                                TREATMENT DATE:  06/01/24 Pt turned O2 up to 4L per PA visit yesterday Baseline 02 sat 97%, HR 76bpm Seated heel/toe raises x20 Seated march x20 sitting edge of chair Checked stats: stable  LAQ 10x5 each LE Seated red tband bil row x10 Seated clam blue loop x 20 2 laps around both gyms with RW - 6 min 30 sec - intermittently checked O2 sat and HR - maintained sat at or above 93%  and HR below 100bpm Standing counter circuit x2 rounds: heel raises x10, march x10, hip abd x5 each, hip ext x5 each RPE 2-3/10, stable stats (O2 Sat 92% quickly rises to 95%, HR staying below 100bpm) NuStep L1 x 5' (HR up to 100bpm, O2 sat 95%) Reminded Pt to return O2 to 3L before leaving  05/30/24 Sit to stands x 10 94% O2, PR 134 bpm; HR 138 bpm x 2 min, 124 bpm x 2 min Seated LAQ x 10  B Seated march 2x 10 HR 138 bpm then proceeds to decrease - when pt was talking increased again to 144 bpm quickly decreased to 112 with no talking. Seated heel raises x 20 up to 124 bpm  rapidly drops to 87 bpm    05/23/2024 Initial Evaluation & HEP created  Correct distance when using walker  PATIENT EDUCATION:  Education details: PT eval findings, anticipated POC, progress with PT, and initial HEP  Person educated: Patient Education method: Explanation, Demonstration, and Handouts Education comprehension: verbalized understanding, returned demonstration, and needs further education  HOME EXERCISE PROGRAM: Access Code: [email or web address] URL: [email or web address] Date: 05/23/2024 Prepared by: Kristeen Sar  Exercises - Heel Raises with Counter Support  - 1 x daily - 7 x weekly - 1-2 sets - 10 reps - Standing March with Counter Support  - 1 x daily - 7 x weekly - 1 sets - 10 reps - Sit to Stand with Armchair  - 1 x daily - 7 x weekly - 2 sets - 5 reps - Seated Long Arc Quad  - 1 x daily - 7 x weekly - 1 sets - 10 reps - 3-4 hold  ASSESSMENT:  CLINICAL IMPRESSION: Pt reported that the PA at her cardiology visit yesterday encouraged her to up her O2 to 4L during exercise.  He also communicated with treating PT that her max HR with exercise should be </= 130bpm.  Pt was able to consistently work throughout session today with very stable stats, with HR staying at 100bpm or less, responding appropriately to demands.  O2 Sat would briefly drop as low as 92% but with cues to take in O2 through  nose would come back up to 95% or more.  Pt was able to perform seated and standing therex, ambulate 2 laps around both gyms with RW, and perform 5' on NuStep with RPE rated 2-3/10.  She may be able to try sessions with 3L of O2 but with option to raise to 4L as needed.  PT continued to monitor stats throughout session.    OBJECTIVE IMPAIRMENTS: Abnormal gait, cardiopulmonary status limiting activity, decreased activity tolerance, decreased balance, decreased endurance, decreased mobility, difficulty walking, decreased ROM, decreased strength, decreased safety awareness, hypomobility, impaired flexibility, and postural dysfunction.   ACTIVITY LIMITATIONS: lifting, bending, squatting, stairs, reach over head, hygiene/grooming, and locomotion level  PARTICIPATION LIMITATIONS: meal prep, cleaning, laundry, interpersonal relationship, shopping, and community activity  PERSONAL FACTORS: Age, Fitness, and 3+ comorbidities: CHF; Graves Disease; Left breast lumpectomy with radiation; HTN are also affecting patient's functional outcome.   REHAB POTENTIAL: Good  CLINICAL DECISION MAKING: Evolving/moderate complexity  EVALUATION COMPLEXITY: Moderate   GOALS: Goals reviewed with patient? Yes  SHORT TERM GOALS: Target date: 06/20/2024  Patient will be independent with initial HEP. Baseline:  Goal status: INITIAL  2.  Patient will report > or = to 30% improvement in ability to perform functional activities (laundry, cooking, and bathing )since starting PT. Baseline:  Goal status: INITIAL   3.  Patient will be able to participate in a to establish baseline for test. Baseline:  Goal status: INITIAL  4.  Patient will demonstrate control when performing sit to stand due to improved LE strength. Baseline:  Goal status: INITIAL    LONG TERM GOALS: Target date: 07/18/2024  Patient will demonstrate independence in advanced HEP. Baseline:  Goal status: INITIAL  2.  Patient will report > or =  to 60% improvement in ability to perform functional activities (laundry, cooking, and bathing )since  starting PT. Baseline:  Goal status: INITIAL  3.  Patient will perform 5STS in < or = to 11 sec due to improved functional mobility and LE strength. Baseline: 14.51 Goal status: INITIAL   4.  Patient will perform TUG in < or = to 15 sec to decrease falls risk. Baseline: 20.74sec Goal status: INITIAL  5.  Patient will ambulate 50-60% of age expected distance during to improve community negotiation. Baseline:  Goal status: INITIAL     PLAN:  PT FREQUENCY: 1-2x/week  PT DURATION: 8 weeks  PLANNED INTERVENTIONS: 97164- PT Re-evaluation, 97110-Therapeutic exercises, 97530- Therapeutic activity, 97112- Neuromuscular re-education, 97535- Self Care, 02859- Manual therapy, [phone]- Gait training, [phone]- Canalith repositioning, [id number]- Aquatic Therapy, [phone]- Electrical stimulation (unattended), [phone]- Electrical stimulation (manual), [id number]- Vasopneumatic device, [id number]- Ultrasound, [id number]- Traction (mechanical), [id number]- Ionotophoresis 4mg /ml Dexamethasone, 79439 (1-2 muscles), 20561 (3+ muscles)- Dry Needling, Patient/Family education, Balance training, Stair training, Joint mobilization, Joint manipulation, Spinal manipulation, Spinal mobilization, Vestibular training, Cryotherapy, and Moist heat  PLAN FOR NEXT SESSION: Monitor HR at all times, standing tolerance; LE strengthening and balance    Orvil Fester, PT 06/01/2024 2:59 PM  [facility] [address], [address] [place], [place] [address] Phone # [phone] Fax [phone]   "

## 2024-06-07 ENCOUNTER — Encounter: Payer: Self-pay | Admitting: Physical Therapy

## 2024-06-07 ENCOUNTER — Encounter: Payer: Self-pay | Admitting: Cardiology

## 2024-06-07 ENCOUNTER — Ambulatory Visit: Admitting: Physical Therapy

## 2024-06-07 DIAGNOSIS — I5033 Acute on chronic diastolic (congestive) heart failure: Secondary | ICD-10-CM | POA: Diagnosis not present

## 2024-06-07 DIAGNOSIS — M6281 Muscle weakness (generalized): Secondary | ICD-10-CM

## 2024-06-07 DIAGNOSIS — R293 Abnormal posture: Secondary | ICD-10-CM

## 2024-06-07 NOTE — Therapy (Signed)
 " OUTPATIENT PHYSICAL THERAPY LOWER EXTREMITY TREATMENT   Patient Name: Carla Petersen MRN: [id number] DOB:11/16/1940, 84 y.o., female Today's Date: 06/07/2024  END OF SESSION:  PT End of Session - 06/07/24 1227     Visit Number 4    Date for Recertification  07/18/24    Authorization Type Medicare/AARP    Progress Note Due on Visit 10    PT Start Time 1109    PT Stop Time 1147    PT Time Calculation (min) 38 min    Equipment Utilized During Treatment Oxygen    Activity Tolerance Patient tolerated treatment well    Behavior During Therapy WFL for tasks assessed/performed            Past Medical History:  Diagnosis Date   CHF (congestive heart failure) (HCC)    Graves disease    Personal history of radiation therapy    Left Breast Cancer   Past Surgical History:  Procedure Laterality Date   BREAST EXCISIONAL BIOPSY Left    BREAST EXCISIONAL BIOPSY Left    BREAST LUMPECTOMY Left 1995   CARDIOVERSION N/A 05/05/2024   Procedure: CARDIOVERSION;  Surgeon: Santo Stanly LABOR, MD;  Location: [facility];  Service: Cardiovascular;  Laterality: N/A;   MASTECTOMY Left    PR THORACENTESIS NEEDLE/CATH PLEURA W/IMAGING  05/01/2024   TRANSESOPHAGEAL ECHOCARDIOGRAM (CATH LAB) N/A 05/05/2024   Procedure: TRANSESOPHAGEAL ECHOCARDIOGRAM;  Surgeon: Santo Stanly LABOR, MD;  Location: [facility];  Service: Cardiovascular;  Laterality: N/A;   Patient Active Problem List   Diagnosis Date Noted   Long term (current) use of anticoagulants 05/09/2024   Cough 05/09/2024   Long term current use of antiarrhythmic drug 05/08/2024   History of cardioversion 05/07/2024   Mixed hyperlipidemia 05/07/2024   Acute on chronic heart failure with preserved ejection fraction (HFpEF) (HCC) 05/07/2024   Hypokalemia 05/05/2024   Hypothyroidism 05/05/2024   Hypertension 05/05/2024   Pressure injury of skin 05/05/2024   Persistent atrial fibrillation (HCC) 05/05/2024   Prolonged QT  interval 05/01/2024   Acute on chronic diastolic heart failure (HCC) 04/30/2024   Acute respiratory failure with hypoxia (HCC) 04/30/2024   Atrial fibrillation with rapid ventricular response (HCC) 04/30/2024   Acute exacerbation of CHF (congestive heart failure) (HCC) 04/29/2024   Anticoagulant long-term use 03/27/2022   Otorrhea, right 03/27/2022   Sleep apnea in adult 12/18/2019   Hernia, inguinal 11/01/2019   Racing heart beat 09/17/2019   Laceration of right thumb without foreign body without damage to nail 06/03/2017   H/O mastoidectomy 08/02/2015   Mastoiditis of right side 08/02/2015   S/P hip replacement 10/03/2014   Status post total replacement of left hip 10/03/2014   Multiple traumatic injuries 07/17/2014    PCP: Ransom Other, MD   REFERRING PROVIDER: Perri LABOR Meliton Mickey., MD  REFERRING DIAG: I50.33 (ICD-10-CM) - Acute on chronic diastolic congestive heart failure (HCC) I50.33 (ICD-10-CM) - Acute on chronic diastolic heart failure (HCC)  THERAPY DIAG:  Muscle weakness (generalized)  Abnormal posture  Rationale for Evaluation and Treatment: Rehabilitation  ONSET DATE: Chronic flare up November 2025  SUBJECTIVE:   SUBJECTIVE STATEMENT: Patient verbalized increased fatigue after last session. She felt this way for about 20 mins after session  PERTINENT HISTORY: CHF; Graves Disease; Left breast lumpectomy with radiation; Hx Rt hip replacement; HTN PAIN:  Are you having pain? No  PRECAUTIONS: None  RED FLAGS: None   WEIGHT BEARING RESTRICTIONS: No  FALLS:  Has patient fallen in last 6  months? No  LIVING ENVIRONMENT: Lives with: lives with their spouse Lives in: House/apartment Stairs: No Has following equipment at home: Single point cane, Environmental Consultant - 2 wheeled, Grab bars, and Ramped entry only uses cane in the car for when she goes to her daughter's house she has two steps to enter  OCCUPATION: Retired  PLOF: Independent, Independent with basic  ADLs, Independent with household mobility without device, Independent with community mobility without device, Independent with gait, Independent with transfers, and Leisure: sewing and knitting, reading  PATIENT GOALS: I don't have any goals  NEXT MD VISIT: Cardiologist Feb 2nd  OBJECTIVE:  Note: Objective measures were completed at Evaluation unless otherwise noted.  DIAGNOSTIC FINDINGS: None  PATIENT SURVEYS:  ABC scale: The Activities-Specific Balance Confidence (ABC) Scale: 66.25% moderate level of physical functioning   COGNITION: Overall cognitive status: Within functional limits for tasks assessed      POSTURE: rounded shoulders, forward head, flexed trunk , and bilateral flexed knees    LOWER EXTREMITY ROM: WFL bilateral      LOWER EXTREMITY MMT:  MMT Right eval Left eval  Hip flexion 4 4-  Hip extension    Hip abduction 4 4-  Hip adduction    Hip internal rotation    Hip external rotation    Knee flexion 4 4  Knee extension 4 4  Ankle dorsiflexion    Ankle plantarflexion    Ankle inversion    Ankle eversion     (Blank rows = not tested)   FUNCTIONAL TESTS:  5 times sit to stand: 14.51 sec with UE support; uncontrolled descent Timed up and go (TUG): 20.74sec  GAIT: Distance walked: 71ft Assistive device utilized: Environmental Consultant - 2 wheeled Comments: Decreased cadence, bilateral flexed knees, increased trunk flexion                                                                                                                                TREATMENT DATE:  06/07/24 O2 increased to 4L Baseline 02 sat 92%, HR 85 bpm Seated march x20 sitting edge of chair   LAQ 10x5 each LE O2 96% 105 bpm then decreased to 87 within 30 sec Standing counter circuit x2 rounds: heel raises x10, march x10, hip abd x5 each, hip ext x5 each Check stats after each round- within functional range 2 laps around both gyms with RW - 53sec - intermittently checked O2 sat and HR  - maintained sat at or above 94% and HR below 100bpm Seated red tband bil row 2x10 Seated biceps curl 3# DB x 10 RPE 3/10  06/01/24 Pt turned O2 up to 4L per PA visit yesterday Baseline 02 sat 97%, HR 76bpm Seated heel/toe raises x20 Seated march x20 sitting edge of chair Checked stats: stable  LAQ 10x5 each LE Seated red tband bil row x10 Seated clam blue loop x 20 2 laps around both gyms with RW - 6 min 30 sec -  intermittently checked O2 sat and HR - maintained sat at or above 93% and HR below 100bpm Standing counter circuit x2 rounds: heel raises x10, march x10, hip abd x5 each, hip ext x5 each RPE 2-3/10, stable stats (O2 Sat 92% quickly rises to 95%, HR staying below 100bpm) NuStep L1 x 5' (HR up to 100bpm, O2 sat 95%) Reminded Pt to return O2 to 3L before leaving 05/30/24 Sit to stands x 10 94% O2, PR 134 bpm; HR 138 bpm x 2 min, 124 bpm x 2 min Seated LAQ x 10 B Seated march 2x 10 HR 138 bpm then proceeds to decrease - when pt was talking increased again to 144 bpm quickly decreased to 112 with no talking. Seated heel raises x 20 up to 124 bpm  rapidly drops to 87 bpm    05/23/2024 Initial Evaluation & HEP created  Correct distance when using walker  PATIENT EDUCATION:  Education details: PT eval findings, anticipated POC, progress with PT, and initial HEP  Person educated: Patient Education method: Explanation, Demonstration, and Handouts Education comprehension: verbalized understanding, returned demonstration, and needs further education  HOME EXERCISE PROGRAM: Access Code: [email or web address] URL: [email or web address] Date: 05/23/2024 Prepared by: Kristeen Sar  Exercises - Heel Raises with Counter Support  - 1 x daily - 7 x weekly - 1-2 sets - 10 reps - Standing March with Counter Support  - 1 x daily - 7 x weekly - 1 sets - 10 reps - Sit to Stand with Armchair  - 1 x daily - 7 x weekly - 2 sets - 5 reps - Seated Long Arc Quad  - 1 x daily - 7 x weekly -  1 sets - 10 reps - 3-4 hold  ASSESSMENT:  CLINICAL IMPRESSION: Patient did well with exercises today. Throughout treatment session PT provided education and cues for breathing technique. Without cues patient holds her breath. She walked the same distance as last session in a reduced amount of time. PT monitored vitals throughout session. HR did not increase above 130 per PA orders. Patient verbalized RPE of 3/10 at end of session. Patient will benefit from skilled PT to address the below impairments and improve overall function.    OBJECTIVE IMPAIRMENTS: Abnormal gait, cardiopulmonary status limiting activity, decreased activity tolerance, decreased balance, decreased endurance, decreased mobility, difficulty walking, decreased ROM, decreased strength, decreased safety awareness, hypomobility, impaired flexibility, and postural dysfunction.   ACTIVITY LIMITATIONS: lifting, bending, squatting, stairs, reach over head, hygiene/grooming, and locomotion level  PARTICIPATION LIMITATIONS: meal prep, cleaning, laundry, interpersonal relationship, shopping, and community activity  PERSONAL FACTORS: Age, Fitness, and 3+ comorbidities: CHF; Graves Disease; Left breast lumpectomy with radiation; HTN are also affecting patient's functional outcome.   REHAB POTENTIAL: Good  CLINICAL DECISION MAKING: Evolving/moderate complexity  EVALUATION COMPLEXITY: Moderate   GOALS: Goals reviewed with patient? Yes  SHORT TERM GOALS: Target date: 06/20/2024  Patient will be independent with initial HEP. Baseline:  Goal status: INITIAL  2.  Patient will report > or = to 30% improvement in ability to perform functional activities (laundry, cooking, and bathing )since starting PT. Baseline:  Goal status: INITIAL   3.  Patient will be able to participate in a to establish baseline for test. Baseline:  Goal status: INITIAL  4.  Patient will demonstrate control when performing sit to stand due to improved  LE strength. Baseline:  Goal status: INITIAL    LONG TERM GOALS: Target date: 07/18/2024  Patient will demonstrate independence in advanced HEP. Baseline:  Goal status: INITIAL  2.  Patient will report > or = to 60% improvement in ability to perform functional activities (laundry, cooking, and bathing )since starting PT. Baseline:  Goal status: INITIAL  3.  Patient will perform 5STS in < or = to 11 sec due to improved functional mobility and LE strength. Baseline: 14.51 Goal status: INITIAL   4.  Patient will perform TUG in < or = to 15 sec to decrease falls risk. Baseline: 20.74sec Goal status: INITIAL  5.  Patient will ambulate 50-60% of age expected distance during to improve community negotiation. Baseline:  Goal status: INITIAL     PLAN:  PT FREQUENCY: 1-2x/week  PT DURATION: 8 weeks  PLANNED INTERVENTIONS: 97164- PT Re-evaluation, 97110-Therapeutic exercises, 97530- Therapeutic activity, 97112- Neuromuscular re-education, 97535- Self Care, 02859- Manual therapy, [phone]- Gait training, [phone]- Canalith repositioning, [id number]- Aquatic Therapy, [phone]- Electrical stimulation (unattended), [phone]- Electrical stimulation (manual), [id number]- Vasopneumatic device, [id number]- Ultrasound, [id number]- Traction (mechanical), [id number]- Ionotophoresis 4mg /ml Dexamethasone, 79439 (1-2 muscles), 20561 (3+ muscles)- Dry Needling, Patient/Family education, Balance training, Stair training, Joint mobilization, Joint manipulation, Spinal manipulation, Spinal mobilization, Vestibular training, Cryotherapy, and Moist heat  PLAN FOR NEXT SESSION: Monitor HR at all times, standing tolerance; LE strengthening and balance     Kristeen Sar, PT, DPT 06/07/24 12:29 PM [facility] [address] [place], [place] [address] Phone # [phone] Fax [phone]   "

## 2024-06-09 ENCOUNTER — Ambulatory Visit: Admitting: Physical Therapy

## 2024-06-09 ENCOUNTER — Encounter: Payer: Self-pay | Admitting: Physical Therapy

## 2024-06-09 DIAGNOSIS — M6281 Muscle weakness (generalized): Secondary | ICD-10-CM

## 2024-06-09 DIAGNOSIS — I5033 Acute on chronic diastolic (congestive) heart failure: Secondary | ICD-10-CM | POA: Diagnosis not present

## 2024-06-09 DIAGNOSIS — R293 Abnormal posture: Secondary | ICD-10-CM

## 2024-06-09 NOTE — Therapy (Signed)
 " OUTPATIENT PHYSICAL THERAPY LOWER EXTREMITY TREATMENT   Patient Name: Carla Petersen MRN: [id number] DOB:01-25-1941, 84 y.o., female Today's Date: 06/09/2024  END OF SESSION:  PT End of Session - 06/09/24 1452     Visit Number 5    Date for Recertification  07/18/24    Authorization Type Medicare/[employer]    Progress Note Due on Visit 10    PT Start Time 1404    PT Stop Time 1444    PT Time Calculation (min) 40 min    Activity Tolerance Patient tolerated treatment well    Behavior During Therapy WFL for tasks assessed/performed             Past Medical History:  Diagnosis Date   CHF (congestive heart failure) (HCC)    Graves disease    Personal history of radiation therapy    Left Breast Cancer   Past Surgical History:  Procedure Laterality Date   BREAST EXCISIONAL BIOPSY Left    BREAST EXCISIONAL BIOPSY Left    BREAST LUMPECTOMY Left 1995   CARDIOVERSION N/A 05/05/2024   Procedure: CARDIOVERSION;  Surgeon: Santo Stanly LABOR, MD;  Location: [facility];  Service: Cardiovascular;  Laterality: N/A;   MASTECTOMY Left    PR THORACENTESIS NEEDLE/CATH PLEURA W/IMAGING  05/01/2024   TRANSESOPHAGEAL ECHOCARDIOGRAM (CATH LAB) N/A 05/05/2024   Procedure: TRANSESOPHAGEAL ECHOCARDIOGRAM;  Surgeon: Santo Stanly LABOR, MD;  Location: [facility];  Service: Cardiovascular;  Laterality: N/A;   Patient Active Problem List   Diagnosis Date Noted   Long term (current) use of anticoagulants 05/09/2024   Cough 05/09/2024   Long term current use of antiarrhythmic drug 05/08/2024   History of cardioversion 05/07/2024   Mixed hyperlipidemia 05/07/2024   Acute on chronic heart failure with preserved ejection fraction (HFpEF) (HCC) 05/07/2024   Hypokalemia 05/05/2024   Hypothyroidism 05/05/2024   Hypertension 05/05/2024   Pressure injury of skin 05/05/2024   Persistent atrial fibrillation (HCC) 05/05/2024   Prolonged QT interval 05/01/2024   Acute on chronic  diastolic heart failure (HCC) 04/30/2024   Acute respiratory failure with hypoxia (HCC) 04/30/2024   Atrial fibrillation with rapid ventricular response (HCC) 04/30/2024   Acute exacerbation of CHF (congestive heart failure) (HCC) 04/29/2024   Anticoagulant long-term use 03/27/2022   Otorrhea, right 03/27/2022   Sleep apnea in adult 12/18/2019   Hernia, inguinal 11/01/2019   Racing heart beat 09/17/2019   Laceration of right thumb without foreign body without damage to nail 06/03/2017   H/O mastoidectomy 08/02/2015   Mastoiditis of right side 08/02/2015   S/P hip replacement 10/03/2014   Status post total replacement of left hip 10/03/2014   Multiple traumatic injuries 07/17/2014    PCP: Ransom Other, MD   REFERRING PROVIDER: Perri LABOR Meliton Mickey., MD  REFERRING DIAG: I50.33 (ICD-10-CM) - Acute on chronic diastolic congestive heart failure (HCC) I50.33 (ICD-10-CM) - Acute on chronic diastolic heart failure (HCC)  THERAPY DIAG:  Muscle weakness (generalized)  Abnormal posture  Rationale for Evaluation and Treatment: Rehabilitation  ONSET DATE: Chronic flare up November 2025  SUBJECTIVE:   SUBJECTIVE STATEMENT: Patient reports she is doing good today. She did not feel overly fatigued after last session.  PERTINENT HISTORY: CHF; Graves Disease; Left breast lumpectomy with radiation; Hx Rt hip replacement; HTN PAIN:  Are you having pain? No  PRECAUTIONS: None  RED FLAGS: None   WEIGHT BEARING RESTRICTIONS: No  FALLS:  Has patient fallen in last 6 months? No  LIVING ENVIRONMENT: Lives with: lives  with their spouse Lives in: House/apartment Stairs: No Has following equipment at home: Single point cane, Walker - 2 wheeled, Grab bars, and Ramped entry only uses cane in the car for when she goes to her daughter's house she has two steps to enter  OCCUPATION: Retired  PLOF: Independent, Independent with basic ADLs, Independent with household mobility without  device, Independent with community mobility without device, Independent with gait, Independent with transfers, and Leisure: sewing and knitting, reading  PATIENT GOALS: I don't have any goals  NEXT MD VISIT: Cardiologist Feb 2nd  OBJECTIVE:  Note: Objective measures were completed at Evaluation unless otherwise noted.  DIAGNOSTIC FINDINGS: None  PATIENT SURVEYS:  ABC scale: The Activities-Specific Balance Confidence (ABC) Scale: 66.25% moderate level of physical functioning   COGNITION: Overall cognitive status: Within functional limits for tasks assessed      POSTURE: rounded shoulders, forward head, flexed trunk , and bilateral flexed knees    LOWER EXTREMITY ROM: WFL bilateral      LOWER EXTREMITY MMT:  MMT Right eval Left eval  Hip flexion 4 4-  Hip extension    Hip abduction 4 4-  Hip adduction    Hip internal rotation    Hip external rotation    Knee flexion 4 4  Knee extension 4 4  Ankle dorsiflexion    Ankle plantarflexion    Ankle inversion    Ankle eversion     (Blank rows = not tested)   FUNCTIONAL TESTS:  5 times sit to stand: 14.51 sec with UE support; uncontrolled descent Timed up and go (TUG): 20.74sec  GAIT: Distance walked: 101ft Assistive device utilized: Environmental Consultant - 2 wheeled Comments: Decreased cadence, bilateral flexed knees, increased trunk flexion                                                                                                                                TREATMENT DATE:  06/09/24 O2 already at 4L Baseline 02 sat 95%, HR 101-95 bpm NuStep L4 x 5' (HR up to 91, O2 sat 95%) Standing heel raise x 20 at barre Standing march x 20 total at barre Standing hip abduction x 12 bilateral at barre Standing hip extension x 12 bilateral at barre Stats: O2 97 HR 100 Sit to stand x 10 with UE support Standing 6 inch step tap no UE support x 20 3 laps around both treatment gyms with RW (patient was fatigued after this) Stats: HR  106 O2 91% Seated red tband bil row 2x10 Seated biceps curl # DB 2 x 10 RPE 4/10    06/07/24 O2 increased to 4L Baseline 02 sat 92%, HR 85 bpm Seated march x20 sitting edge of chair   LAQ 10x5 each LE O2 96% 105 bpm then decreased to 87 within 30 sec Standing counter circuit x2 rounds: heel raises x10, march x10, hip abd x5 each, hip ext x5 each Check stats after each round- within functional range  2 laps around both gyms with RW - 53sec - intermittently checked O2 sat and HR - maintained sat at or above 94% and HR below 100bpm Seated red tband bil row 2x10 Seated biceps curl 3# DB x 10 RPE 3/10  06/01/24 Pt turned O2 up to 4L per PA visit yesterday Baseline 02 sat 97%, HR 76bpm Seated heel/toe raises x20 Seated march x20 sitting edge of chair Checked stats: stable  LAQ 10x5 each LE Seated red tband bil row x10 Seated clam blue loop x 20 2 laps around both gyms with RW - 6 min 30 sec - intermittently checked O2 sat and HR - maintained sat at or above 93% and HR below 100bpm Standing counter circuit x2 rounds: heel raises x10, march x10, hip abd x5 each, hip ext x5 each RPE 2-3/10, stable stats (O2 Sat 92% quickly rises to 95%, HR staying below 100bpm) NuStep L1 x 5' (HR up to 100bpm, O2 sat 95%) Reminded Pt to return O2 to 3L before leaving  05/30/24 Sit to stands x 10 94% O2, PR 134 bpm; HR 138 bpm x 2 min, 124 bpm x 2 min Seated LAQ x 10 B Seated march 2x 10 HR 138 bpm then proceeds to decrease - when pt was talking increased again to 144 bpm quickly decreased to 112 with no talking. Seated heel raises x 20 up to 124 bpm  rapidly drops to 87 bpm   PATIENT EDUCATION:  Education details: PT eval findings, anticipated POC, progress with PT, and initial HEP  Person educated: Patient Education method: Explanation, Demonstration, and Handouts Education comprehension: verbalized understanding, returned demonstration, and needs further education  HOME EXERCISE  PROGRAM: Access Code: [email or web address] URL: [email or web address] Date: 05/23/2024 Prepared by: Kristeen Sar  Exercises - Heel Raises with Counter Support  - 1 x daily - 7 x weekly - 1-2 sets - 10 reps - Standing March with Counter Support  - 1 x daily - 7 x weekly - 1 sets - 10 reps - Sit to Stand with Armchair  - 1 x daily - 7 x weekly - 2 sets - 5 reps - Seated Long Arc Quad  - 1 x daily - 7 x weekly - 1 sets - 10 reps - 3-4 hold  ASSESSMENT:  CLINICAL IMPRESSION: Patient presents today with no complaints. She did not verbalize any increased fatigue after last session. Today we did more consecutive standing exercises before resting and patient tolerated them well. After the first standing strengthening series patient verbalized needed a rest break. PT monitored vitals throughout sess and we able to keep BP below 130 bpm. She was able to make three laps today around both treatment gyms with minimal difficulty.. This is much improved compared to previous treatment sessions. Patient did verbalize fatigue at end of walking. Overall, she tolerated treatment session well and a would benefit from continued therapy to meet goals and improve cardiovascular endurance.    OBJECTIVE IMPAIRMENTS: Abnormal gait, cardiopulmonary status limiting activity, decreased activity tolerance, decreased balance, decreased endurance, decreased mobility, difficulty walking, decreased ROM, decreased strength, decreased safety awareness, hypomobility, impaired flexibility, and postural dysfunction.   ACTIVITY LIMITATIONS: lifting, bending, squatting, stairs, reach over head, hygiene/grooming, and locomotion level  PARTICIPATION LIMITATIONS: meal prep, cleaning, laundry, interpersonal relationship, shopping, and community activity  PERSONAL FACTORS: Age, Fitness, and 3+ comorbidities: CHF; Graves Disease; Left breast lumpectomy with radiation; HTN are also affecting patient's functional outcome.   REHAB  POTENTIAL: Good  CLINICAL DECISION MAKING: Evolving/moderate complexity  EVALUATION COMPLEXITY: Moderate   GOALS: Goals reviewed with patient? Yes  SHORT TERM GOALS: Target date: 06/20/2024  Patient will be independent with initial HEP. Baseline:  Goal status: INITIAL  2.  Patient will report > or = to 30% improvement in ability to perform functional activities (laundry, cooking, and bathing )since starting PT. Baseline:  Goal status: INITIAL   3.  Patient will be able to participate in a to establish baseline for test. Baseline:  Goal status: INITIAL  4.  Patient will demonstrate control when performing sit to stand due to improved LE strength. Baseline:  Goal status: INITIAL    LONG TERM GOALS: Target date: 07/18/2024  Patient will demonstrate independence in advanced HEP. Baseline:  Goal status: INITIAL  2.  Patient will report > or = to 60% improvement in ability to perform functional activities (laundry, cooking, and bathing )since starting PT. Baseline:  Goal status: INITIAL  3.  Patient will perform 5STS in < or = to 11 sec due to improved functional mobility and LE strength. Baseline: 14.51 Goal status: INITIAL   4.  Patient will perform TUG in < or = to 15 sec to decrease falls risk. Baseline: 20.74sec Goal status: INITIAL  5.  Patient will ambulate 50-60% of age expected distance during to improve community negotiation. Baseline:  Goal status: INITIAL     PLAN:  PT FREQUENCY: 1-2x/week  PT DURATION: 8 weeks  PLANNED INTERVENTIONS: 97164- PT Re-evaluation, 97110-Therapeutic exercises, 97530- Therapeutic activity, 97112- Neuromuscular re-education, 97535- Self Care, [id number]- Manual therapy, [phone]- Gait training, [phone]- Canalith repositioning, [id number]- Aquatic Therapy, [phone]- Electrical stimulation (unattended), [phone]- Electrical stimulation (manual), [id number]- Vasopneumatic device, [id number]- Ultrasound, [id number]- Traction (mechanical), [id number]-  Ionotophoresis 4mg /ml Dexamethasone, [id number] (1-2 muscles), [id number] (3+ muscles)- Dry Needling, Patient/Family education, Balance training, Stair training, Joint mobilization, Joint manipulation, Spinal manipulation, Spinal mobilization, Vestibular training, Cryotherapy, and Moist heat  PLAN FOR NEXT SESSION: ; Monitor HR at all times, standing tolerance; LE strengthening and balance     Kristeen Sar, PT, DPT 06/09/24 2:53 PM [facility] [address] [place], [place] [address] Phone # [phone] Fax [phone]   "

## 2024-06-10 ENCOUNTER — Other Ambulatory Visit (HOSPITAL_COMMUNITY): Payer: Self-pay | Admitting: *Deleted

## 2024-06-10 MED ORDER — AMIODARONE HCL 200 MG PO TABS: 200.0000 mg | ORAL_TABLET | Freq: Two times a day (BID) | ORAL | 3 refills | Status: AC

## 2024-06-14 ENCOUNTER — Ambulatory Visit: Admitting: Physical Therapy

## 2024-06-16 ENCOUNTER — Ambulatory Visit: Admitting: Physical Therapy

## 2024-06-16 ENCOUNTER — Encounter: Payer: Self-pay | Admitting: Physical Therapy

## 2024-06-16 DIAGNOSIS — R293 Abnormal posture: Secondary | ICD-10-CM

## 2024-06-16 DIAGNOSIS — I5033 Acute on chronic diastolic (congestive) heart failure: Secondary | ICD-10-CM | POA: Diagnosis not present

## 2024-06-16 DIAGNOSIS — M6281 Muscle weakness (generalized): Secondary | ICD-10-CM

## 2024-06-16 NOTE — Therapy (Signed)
 " OUTPATIENT PHYSICAL THERAPY LOWER EXTREMITY TREATMENT   Patient Name: Carla Petersen MRN: [id number] DOB:[birth year], 84 y.o., female Today's Date: 06/16/2024  END OF SESSION:  PT End of Session - 06/16/24 1632     Visit Number 6    Date for Recertification  07/18/24    Authorization Type Medicare/[employer]    Progress Note Due on Visit 10    PT Start Time 1535    PT Stop Time 1615    PT Time Calculation (min) 40 min    Equipment Utilized During Treatment Oxygen    Activity Tolerance Patient tolerated treatment well    Behavior During Therapy WFL for tasks assessed/performed              Past Medical History:  Diagnosis Date   CHF (congestive heart failure) (HCC)    Graves disease    Personal history of radiation therapy    Left Breast Cancer   Past Surgical History:  Procedure Laterality Date   BREAST EXCISIONAL BIOPSY Left    BREAST EXCISIONAL BIOPSY Left    BREAST LUMPECTOMY Left 1995   CARDIOVERSION N/A 05/05/2024   Procedure: CARDIOVERSION;  Surgeon: Santo Stanly LABOR, MD;  Location: [facility];  Service: Cardiovascular;  Laterality: N/A;   MASTECTOMY Left    PR THORACENTESIS NEEDLE/CATH PLEURA W/IMAGING  05/01/2024   TRANSESOPHAGEAL ECHOCARDIOGRAM (CATH LAB) N/A 05/05/2024   Procedure: TRANSESOPHAGEAL ECHOCARDIOGRAM;  Surgeon: Santo Stanly LABOR, MD;  Location: [facility];  Service: Cardiovascular;  Laterality: N/A;   Patient Active Problem List   Diagnosis Date Noted   Long term (current) use of anticoagulants 05/09/2024   Cough 05/09/2024   Long term current use of antiarrhythmic drug 05/08/2024   History of cardioversion 05/07/2024   Mixed hyperlipidemia 05/07/2024   Acute on chronic heart failure with preserved ejection fraction (HFpEF) (HCC) 05/07/2024   Hypokalemia 05/05/2024   Hypothyroidism 05/05/2024   Hypertension 05/05/2024   Pressure injury of skin 05/05/2024   Persistent atrial fibrillation (HCC) 05/05/2024    Prolonged QT interval 05/01/2024   Acute on chronic diastolic heart failure (HCC) 04/30/2024   Acute respiratory failure with hypoxia (HCC) 04/30/2024   Atrial fibrillation with rapid ventricular response (HCC) 04/30/2024   Acute exacerbation of CHF (congestive heart failure) (HCC) 04/29/2024   Anticoagulant long-term use 03/27/2022   Otorrhea, right 03/27/2022   Sleep apnea in adult 12/18/2019   Hernia, inguinal 11/01/2019   Racing heart beat 09/17/2019   Laceration of right thumb without foreign body without damage to nail 06/03/2017   H/O mastoidectomy 08/02/2015   Mastoiditis of right side 08/02/2015   S/P hip replacement 10/03/2014   Status post total replacement of left hip 10/03/2014   Multiple traumatic injuries 07/17/2014    PCP: Ransom Other, MD   REFERRING PROVIDER: Perri LABOR Meliton Mickey., MD  REFERRING DIAG: I50.33 (ICD-10-CM) - Acute on chronic diastolic congestive heart failure (HCC) I50.33 (ICD-10-CM) - Acute on chronic diastolic heart failure (HCC)  THERAPY DIAG:  Muscle weakness (generalized)  Abnormal posture  Rationale for Evaluation and Treatment: Rehabilitation  ONSET DATE: Chronic flare up November 2025  SUBJECTIVE:   SUBJECTIVE STATEMENT: Patient reports she is doing good today no new complaints.  PERTINENT HISTORY: CHF; Graves Disease; Left breast lumpectomy with radiation; Hx Rt hip replacement; HTN PAIN:  Are you having pain? No  PRECAUTIONS: None  RED FLAGS: None   WEIGHT BEARING RESTRICTIONS: No  FALLS:  Has patient fallen in last 6 months? No  LIVING ENVIRONMENT:  Lives with: lives with their spouse Lives in: House/apartment Stairs: No Has following equipment at home: Single point cane, Environmental Consultant - 2 wheeled, Grab bars, and Ramped entry only uses cane in the car for when she goes to her daughter's house she has two steps to enter  OCCUPATION: Retired  PLOF: Independent, Independent with basic ADLs, Independent with household  mobility without device, Independent with community mobility without device, Independent with gait, Independent with transfers, and Leisure: sewing and knitting, reading  PATIENT GOALS: I don't have any goals  NEXT MD VISIT: Cardiologist Feb 2nd  OBJECTIVE:  Note: Objective measures were completed at Evaluation unless otherwise noted.  DIAGNOSTIC FINDINGS: None  PATIENT SURVEYS:  ABC scale: The Activities-Specific Balance Confidence (ABC) Scale: 66.25% moderate level of physical functioning   COGNITION: Overall cognitive status: Within functional limits for tasks assessed      POSTURE: rounded shoulders, forward head, flexed trunk , and bilateral flexed knees    LOWER EXTREMITY ROM: WFL bilateral      LOWER EXTREMITY MMT:  MMT Right eval Left eval  Hip flexion 4 4-  Hip extension    Hip abduction 4 4-  Hip adduction    Hip internal rotation    Hip external rotation    Knee flexion 4 4  Knee extension 4 4  Ankle dorsiflexion    Ankle plantarflexion    Ankle inversion    Ankle eversion     (Blank rows = not tested)   FUNCTIONAL TESTS:  5 times sit to stand: 14.51 sec with UE support; uncontrolled descent Timed up and go (TUG): 20.74sec  06/16/2024 : 667FT with FWW RPE 1/10  GAIT: Distance walked: 93ft Assistive device utilized: Environmental Consultant - 2 wheeled Comments: Decreased cadence, bilateral flexed knees, increased trunk flexion                                                                                                                                TREATMENT DATE:  06/16/24 O2 already at 4L Baseline 02 sat 95%, HR 94 bpm O2 98 % HR 90 NuStep L4 x 6' (HR up to 86, O2 sat 98%)- decreased speed towards the end Seated biceps curl # DB 2 x 10 Standing row and extension with red TB 2 x 10 Hurdles (forwards & sideways) x 3 laps each unilateral UE support at barre 6in step ups with unilateral UE at barre x 10 bilateral  HR 110 then 98; O2 94% Sit to  stand x 12 with UE support    06/09/24 O2 already at 4L Baseline 02 sat 95%, HR 101-95 bpm NuStep L4 x 5' (HR up to 91, O2 sat 95%) Standing heel raise x 20 at barre Standing march x 20 total at barre Standing hip abduction x 12 bilateral at barre Standing hip extension x 12 bilateral at barre Stats: O2 97 HR 100 Sit to stand x 10 with UE support Standing 6 inch step  tap no UE support x 20 3 laps around both treatment gyms with RW (patient was fatigued after this) Stats: HR 106 O2 91% Seated red tband bil row 2x10 Seated biceps curl # DB 2 x 10 RPE 4/10    06/07/24 O2 increased to 4L Baseline 02 sat 92%, HR 85 bpm Seated march x20 sitting edge of chair   LAQ 10x5 each LE O2 96% 105 bpm then decreased to 87 within 30 sec Standing counter circuit x2 rounds: heel raises x10, march x10, hip abd x5 each, hip ext x5 each Check stats after each round- within functional range 2 laps around both gyms with RW - 53sec - intermittently checked O2 sat and HR - maintained sat at or above 94% and HR below 100bpm Seated red tband bil row 2x10 Seated biceps curl 3# DB x 10 RPE 3/10  06/01/24 Pt turned O2 up to 4L per PA visit yesterday Baseline 02 sat 97%, HR 76bpm Seated heel/toe raises x20 Seated march x20 sitting edge of chair Checked stats: stable  LAQ 10x5 each LE Seated red tband bil row x10 Seated clam blue loop x 20 2 laps around both gyms with RW - 6 min 30 sec - intermittently checked O2 sat and HR - maintained sat at or above 93% and HR below 100bpm Standing counter circuit x2 rounds: heel raises x10, march x10, hip abd x5 each, hip ext x5 each RPE 2-3/10, stable stats (O2 Sat 92% quickly rises to 95%, HR staying below 100bpm) NuStep L1 x 5' (HR up to 100bpm, O2 sat 95%) Reminded Pt to return O2 to 3L before leaving    PATIENT EDUCATION:  Education details: PT eval findings, anticipated POC, progress with PT, and initial HEP  Person educated: Patient Education  method: Explanation, Demonstration, and Handouts Education comprehension: verbalized understanding, returned demonstration, and needs further education  HOME EXERCISE PROGRAM: Access Code: [email or web address] URL: [email or web address] Date: 05/23/2024 Prepared by: Kristeen Sar  Exercises - Heel Raises with Counter Support  - 1 x daily - 7 x weekly - 1-2 sets - 10 reps - Standing March with Counter Support  - 1 x daily - 7 x weekly - 1 sets - 10 reps - Sit to Stand with Armchair  - 1 x daily - 7 x weekly - 2 sets - 5 reps - Seated Long Arc Quad  - 1 x daily - 7 x weekly - 1 sets - 10 reps - 3-4 hold  ASSESSMENT:  CLINICAL IMPRESSION: Patient presents today with no complaints. Today we did a and patient ambulated 45% of her age expected distance. She verbalized a RPE of 1/10. Today we were able to increased exercise intensity while keeping patient's HR under 130 bpm. Step ups were a good exercises for her. Patient verbalized 3/10 RPE at end of session. PT monitored vitals at session to stay within given parameters. Patient will benefit from skilled PT to address the below impairments and improve overall function.     OBJECTIVE IMPAIRMENTS: Abnormal gait, cardiopulmonary status limiting activity, decreased activity tolerance, decreased balance, decreased endurance, decreased mobility, difficulty walking, decreased ROM, decreased strength, decreased safety awareness, hypomobility, impaired flexibility, and postural dysfunction.   ACTIVITY LIMITATIONS: lifting, bending, squatting, stairs, reach over head, hygiene/grooming, and locomotion level  PARTICIPATION LIMITATIONS: meal prep, cleaning, laundry, interpersonal relationship, shopping, and community activity  PERSONAL FACTORS: Age, Fitness, and 3+ comorbidities: CHF; Graves Disease; Left breast lumpectomy with radiation; HTN are also affecting patient's functional outcome.   REHAB  POTENTIAL: Good  CLINICAL DECISION MAKING:  Evolving/moderate complexity  EVALUATION COMPLEXITY: Moderate   GOALS: Goals reviewed with patient? Yes  SHORT TERM GOALS: Target date: 06/20/2024  Patient will be independent with initial HEP. Baseline:  Goal status: INITIAL  2.  Patient will report > or = to 30% improvement in ability to perform functional activities (laundry, cooking, and bathing )since starting PT. Baseline:  Goal status: INITIAL   3.  Patient will be able to participate in a to establish baseline for test. Baseline:  Goal status: INITIAL  4.  Patient will demonstrate control when performing sit to stand due to improved LE strength. Baseline:  Goal status: INITIAL    LONG TERM GOALS: Target date: 07/18/2024  Patient will demonstrate independence in advanced HEP. Baseline:  Goal status: INITIAL  2.  Patient will report > or = to 60% improvement in ability to perform functional activities (laundry, cooking, and bathing )since starting PT. Baseline:  Goal status: INITIAL  3.  Patient will perform 5STS in < or = to 11 sec due to improved functional mobility and LE strength. Baseline: 14.51 Goal status: INITIAL   4.  Patient will perform TUG in < or = to 15 sec to decrease falls risk. Baseline: 20.74sec Goal status: INITIAL  5.  Patient will ambulate 50-60% of age expected distance during to improve community negotiation. Baseline:  Goal status: INITIAL     PLAN:  PT FREQUENCY: 1-2x/week  PT DURATION: 8 weeks  PLANNED INTERVENTIONS: 97164- PT Re-evaluation, 97110-Therapeutic exercises, 97530- Therapeutic activity, 97112- Neuromuscular re-education, 97535- Self Care, [id number]- Manual therapy, [id number]- Gait training, [phone]- Canalith repositioning, [phone]- Aquatic Therapy, [id number]- Electrical stimulation (unattended), [phone]- Electrical stimulation (manual), [id number]- Vasopneumatic device, [id number]- Ultrasound, [id number]- Traction (mechanical), [id number]- Ionotophoresis 4mg /ml Dexamethasone, [id number] (1-2 muscles),  [id number] (3+ muscles)- Dry Needling, Patient/Family education, Balance training, Stair training, Joint mobilization, Joint manipulation, Spinal manipulation, Spinal mobilization, Vestibular training, Cryotherapy, and Moist heat  PLAN FOR NEXT SESSION: step ups; single leg balance; Monitor HR at all times, standing tolerance; LE strengthening and balance     Kristeen Sar, PT, DPT 06/16/24 4:33 PM [facility] [address] [place], [place] [address] Phone # [phone] Fax [phone]   "

## 2024-06-19 NOTE — Progress Notes (Unsigned)
 "  Cardiology Office Note    Date:  06/19/2024  ID:  Carla Petersen, DOB November 04, 1940, MRN [id number] PCP:  Ransom Other, MD  Cardiologist:  Lonni LITTIE Nanas, MD  Electrophysiologist:  Donnice DELENA Primus, MD   Chief Complaint: ***  History of Present Illness: .    Carla Petersen is a 84 y.o. female with visit-pertinent history of Graves' disease, hyperlipidemia, prediabetes, breast cancer, HFpEF, persistent atrial fibrillation on Eliquis  and amiodarone .   Patient has been followed by Dr. Nanas, initially referred by her PCP for evaluation of palpitations.  She was initially seen on 10/14/2019.  She reported that she been having palpitations and a few weeks prior to her appointment described as heart racing.  Echocardiogram in 10/2019 showed normal biventricular function, no significant valvular disease.  Zio patch worn for 7 days in 10/2019 showed 1% atrial fibrillation/atrial flutter burden with an average heart rate of 152 bpm, longest episode lasting for 4 minutes.  She was started Eliquis  5 mg twice daily and metoprolol  25 mg twice daily.  Calcium  score 179, 54th percentile in 04/2022.  Echo in 09/2022 showed EF 6065%, G1 DD, normal RV function, aortic valve sclerosis.  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   Patient was last seen in clinic on 09/17/2023 by Dr. Nanas.  Patient reported she was doing okay, denied any chest pain.  She continued to have intermittent lower extremity edema, felt to be related to use of amlodipine .   On chart review on 04/12/2024 patient presented to [facility] in [place], [place] , was in the area for the holidays.  She presented the emergency room due to shortness of breath and palpitations, was noted be in atrial fibrillation with RVR with heart rates in the 140s.  Oxygen saturation was 83% in the prehospital setting, she was admitted and started on IV  diltiazem .  Chest x-ray demonstrated moderate right sided pleural effusion and small left-sided pleural effusion.  CT chest did not demonstrate any pulmonary emboli.  She was given IV Lasix  as well as initiated on antibiotic therapy.  EP was consulted, patient underwent cardioversion on 04/18/2024 but remained in atrial fibrillation, was started on amiodarone  drip then transition to oral amiodarone .  Patient had bilateral pleural effusions and pericardial effusion, BNP 249.  Patient was given IV Lasix , had right sided thoracentesis for pleural effusion with removal of 500 mL fluid on 11/26.  Echo per notes showed EF 55 to 60%.  Patient required oxygen during hospitalization, discharged on 2 L nasal cannula.  Patient was discharged on amiodarone  200 mg twice daily.   Echocardiogram on 04/13/24 indicated mild concentric LVH, LVEF 55 to 60%, wall motion assessment was limited, diastolic dysfunction was indeterminant, RV systolic function was normal, LA was severely dilated, RA was normal.  Noted to have mild mitral annular calcification, no evidence of mitral valve prolapse, mild mitral regurgitation, aortic valve appeared moderately sclerotic, mild aortic stenosis, noted to have a small pericardial effusion.  Patient was seen in clinic on 04/29/24 for hospital follow-up.  Patient had significant shortness of breath, had been consistently requiring oxygen since hospital discharge.  While in office patient's oxygen saturation was not maintaining above 90%, had significant lower extremity edema.  Patient was sent to the emergency department for further evaluation.  At office visit patient was in sinus rhythm however had QT prolongation.  In the emergency department she was hypokalemic to  2.7.  While inpatient underwent further diuresis as well as TEE and DCCV with a brief return of normal sinus rhythm before immediate conversion back to atrial fibrillation.  Patient was reloaded on amiodarone  and chemically converted  to normal sinus rhythm on 12/18.  Patient returned to atrial fibrillation, was rate controlled with amiodarone , required multiple loads with IV amiodarone .  Diuresis and escalation of GDMT complicated by hypotension, patient was started on midodrine .  Patient was discharged in stable condition on 05/14/2024.  Today she presents for follow-up.  She reports that she    HFpEF: Patient recently admitted in 04/2024 with volume overload in setting of decompensated heart failure.  Diuresis and GDMT was challenging while inpatient due to low blood pressures and atrial fibrillation. Today she appears  Persistant atrial fibrillation: Patient admitted at outside hospital in 04/2024, found to be in atrial fibrillation with RVR.  Underwent unsuccessful DCCV on 12/1 and was discharged on p.o. Amio.  At follow-up visit on 12/12 she was in sinus rhythm however with prolonged QT.  Patient was referred to emergency department for volume overload, was found to be back in atrial fibrillation with RVR on admission, underwent's unsuccessful DCCV on 12/18 however patient later converted to normal sinus rhythm on her own, required multiple IV amiodarone  loads. Today she Patient denies bleeding problems on Eliquis .  CAD:  HTN:    Labwork independently reviewed:   ROS: .   *** denies chest pain, shortness of breath, lower extremity edema, fatigue, palpitations, melena, hematuria, hemoptysis, diaphoresis, weakness, presyncope, syncope, orthopnea, and PND.  All other systems are reviewed and otherwise negative.  Studies Reviewed: SABRA    EKG:  EKG is ordered today, personally reviewed, demonstrating ***     CV Studies: Cardiac studies reviewed are outlined and summarized above. Otherwise please see EMR for full report. Cardiac Studies & Procedures   ______________________________________________________________________________________________     ECHOCARDIOGRAM  ECHOCARDIOGRAM LIMITED  04/30/2024  Narrative ECHOCARDIOGRAM LIMITED REPORT    Patient Name:   Carla Petersen Date of Exam: 04/30/2024 Medical Rec #:  [id number]      Height:       63.0 in Accession #:    [id number]     Weight:       163.1 lb Date of Birth:  03-Jun-1940       BSA:          1.773 m Patient Age:    83 years       BP:           117/64 mmHg Patient Gender: F              HR:           110 bpm. Exam Location:  Inpatient  Procedure: Limited Color Doppler, Cardiac Doppler and Limited Echo (Both Spectral and Color Flow Doppler were utilized during procedure).  Indications:    acute systolic chf  History:        Patient has prior history of Echocardiogram examinations, most recent 10/02/2022. CHF, Arrythmias:Atrial Fibrillation; Risk Factors:Sleep Apnea.  Sonographer:    Tinnie Barefoot RDCS Referring Phys: [id number] ROBERT DORRELL  IMPRESSIONS   1. Left ventricular ejection fraction, by estimation, is >75%. The left ventricle has hyperdynamic function. The left ventricle has no regional wall motion abnormalities. Left ventricular diastolic parameters are indeterminate. 2. Right ventricular systolic function is normal. The right ventricular size is normal. There is mildly elevated pulmonary artery systolic pressure. 3. Left atrial size was severely dilated. 4. Right atrial  size was mildly dilated. 5. Moderate pleural effusion in the left lateral region. 6. The mitral valve is normal in structure. Mild to moderate mitral valve regurgitation. No evidence of mitral stenosis. 7. The aortic valve is calcified. Aortic valve regurgitation is not visualized. Mild aortic valve stenosis. 8. The inferior vena cava is dilated in size with >50% respiratory variability, suggesting right atrial pressure of 8 mmHg.  FINDINGS Left Ventricle: Left ventricular ejection fraction, by estimation, is >75%. The left ventricle has hyperdynamic function. The left ventricle has no regional wall motion abnormalities. The  left ventricular internal cavity size was normal in size. There is no left ventricular hypertrophy. Left ventricular diastolic parameters are indeterminate. Left ventricular diastolic function could not be evaluated due to atrial fibrillation.  Right Ventricle: The right ventricular size is normal. Right ventricular systolic function is normal. There is mildly elevated pulmonary artery systolic pressure. The tricuspid regurgitant velocity is 2.89 m/s, and with an assumed right atrial pressure of 8 mmHg, the estimated right ventricular systolic pressure is 41.4 mmHg.  Left Atrium: Left atrial size was severely dilated.  Right Atrium: Right atrial size was mildly dilated.  Pericardium: There is no evidence of pericardial effusion.  Mitral Valve: The mitral valve is normal in structure. Mild mitral annular calcification. Mild to moderate mitral valve regurgitation. No evidence of mitral valve stenosis.  Tricuspid Valve: The tricuspid valve is normal in structure. Tricuspid valve regurgitation is mild . No evidence of tricuspid stenosis.  Aortic Valve: The aortic valve is calcified. Aortic valve regurgitation is not visualized. Mild aortic stenosis is present. Aortic valve mean gradient measures 12.2 mmHg. Aortic valve peak gradient measures 22.0 mmHg.  Pulmonic Valve: The pulmonic valve was normal in structure. Pulmonic valve regurgitation is not visualized. No evidence of pulmonic stenosis.  Aorta: The aortic root is normal in size and structure.  Venous: The inferior vena cava is dilated in size with greater than 50% respiratory variability, suggesting right atrial pressure of 8 mmHg.  IAS/Shunts: No atrial level shunt detected by color flow Doppler.  Additional Comments: There is a moderate pleural effusion in the left lateral region. Spectral Doppler performed. Color Doppler performed.  LEFT VENTRICLE PLAX 2D LVIDd:         4.30 cm LVIDs:         2.60 cm   IVC IVC diam: 2.20  cm  AORTIC VALVE AV Vmax:           234.50 cm/s AV Vmean:          160.250 cm/s AV VTI:            0.361 m AV Peak Grad:      22.0 mmHg AV Mean Grad:      12.2 mmHg LVOT Vmax:         98.25 cm/s LVOT Vmean:        63.900 cm/s LVOT VTI:          0.176 m LVOT/AV VTI ratio: 0.49  AORTA Ao Asc diam: 3.00 cm  TRICUSPID VALVE TR Peak grad:   33.4 mmHg TR Vmax:        289.00 cm/s  SHUNTS Systemic VTI: 0.18 m  Redell Shallow MD Electronically signed by Redell Shallow MD Signature Date/Time: 04/30/2024/1:57:13 PM    Final   TEE  ECHO TEE 05/05/2024  Narrative TRANSESOPHOGEAL ECHO REPORT    Patient Name:   TYSON SAMUEL Date of Exam: 05/05/2024 Medical Rec #:  [id number]      Height:  63.0 in Accession #:    [id number]     Weight:       169.0 lb Date of Birth:  1941-05-02       BSA:          1.800 m Patient Age:    83 years       BP:           96/52 mmHg Patient Gender: F              HR:           112 bpm. Exam Location:  Inpatient  Procedure: Transesophageal Echo, 3D Echo, Color Doppler and Cardiac Doppler (Both Spectral and Color Flow Doppler were utilized during procedure).  Indications:     Atrial Fibrillation  History:         Patient has prior history of Echocardiogram examinations, most recent 04/30/2024. CHF, Arrythmias:Atrial Fibrillation; Risk Factors:Sleep Apnea.  Sonographer:     Koleen Popper RDCS Referring Phys:  [id number] [facility] A SANTO Diagnosing Phys: Stanly Santo MD  PROCEDURE: After discussion of the risks and benefits of a TEE, an informed consent was obtained from the patient. The transesophogeal probe was passed without difficulty through the esophogus of the patient. Imaged were obtained with the patient in a left lateral decubitus position. Sedation performed by different physician. The patient was monitored while under deep sedation. Anesthestetic sedation was provided intravenously by Anesthesiology: 173mg  of Propofol .  Image quality was good. The patient developed no complications during the procedure. An unsuccessful direct current cardioversion was performed. 1st attempt at 200J, 2nd attempt at 250J, and 3rd attempt at 300J.  IMPRESSIONS   1. Left ventricular ejection fraction, by estimation, is 60 to 65%. The left ventricle has normal function. 2. Right ventricular systolic function is normal. The right ventricular size is normal. 3. Left atrial size was severely dilated. No left atrial/left atrial appendage thrombus was detected. 4. Right atrial size was severely dilated. 5. Moderate pleural effusion in the left lateral region. 6. The mitral valve is normal in structure. Mild to moderate mitral valve regurgitation. No evidence of mitral stenosis. 7. Functional bicuspid . 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 Aortic valve regurgitation is not visualized. No aortic stenosis is present. 8. 3D performed of the LAA and demonstrates Maximal landing zone 17 mm with wall distance 17 mm.  FINDINGS Left Ventricle: Left ventricular ejection fraction, by estimation, is 60 to 65%. The left ventricle has normal function. The left ventricular internal cavity size was normal in size.  Right Ventricle: The right ventricular size is normal. No increase in right ventricular wall thickness. Right ventricular systolic function is normal.  Left Atrium: Left atrial size was severely dilated. No left atrial/left atrial appendage thrombus was detected.  Right Atrium: Right atrial size was severely dilated.  Pericardium: There is no evidence of pericardial effusion.  Mitral Valve: The mitral valve is normal in structure. Mild to moderate mitral valve regurgitation. No evidence of mitral valve stenosis.  Tricuspid Valve: The tricuspid valve is normal in structure. Tricuspid valve regurgitation is mild . No evidence of tricuspid stenosis.  Aortic Valve: Functional bicuspid. 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 Aortic valve regurgitation is  not visualized. No aortic stenosis is present.  Pulmonic Valve: The pulmonic valve was not well visualized. Pulmonic valve regurgitation is not visualized. No evidence of pulmonic stenosis.  Aorta: The aortic root and ascending aorta are structurally normal, with no evidence of dilitation.  IAS/Shunts: No atrial level shunt detected by color  flow Doppler.  Additional Comments: There is a moderate pleural effusion in the left lateral region. Spectral Doppler performed.  MR Peak grad: 102.8 mmHg  TRICUSPID VALVE MR Vmax:      507.00 cm/s TR Peak grad:   28.7 mmHg TR Vmax:        268.00 cm/s  Stanly Leavens MD Electronically signed by Stanly Leavens MD Signature Date/Time: 05/05/2024/4:54:12 PM    Final  MONITORS  LONG TERM MONITOR (3-14 DAYS) 11/10/2019  Narrative  1% AF/AFL burden, with average HR 152 bpm and longest episode lasting 4 minutes.  7 days of data recorded on Zio monitor. Patient had a min HR of 55 bpm, max HR of 208 bpm, and avg HR of 152 bpm. Predominant underlying rhythm was Sinus Rhythm. No VT, high degree block, or pauses noted. 1 episode of SVT lasting 9 beats.  1% AF/AFL burden, with average HR 152 bpm and longest episode lasting 4 minutes.  Isolated atrial and ventricular ectopy was rare (<1%). There were 15 triggered events, which corresponded to AF/AFL, SVT, and sinus rhythm.   CT SCANS  CT CORONARY MORPH W/CTA COR W/SCORE 03/23/2023  Addendum 04/12/2023 11:36 PM ADDENDUM REPORT: 04/12/2023 23:33  EXAM: OVER-READ INTERPRETATION  CT CHEST  The following report is an over-read performed by radiologist Dr. Greig Ferraris [facility] on 04/12/2023. This over-read does not include interpretation of cardiac or coronary anatomy or pathology. The coronary CTA interpretation by the cardiologist is attached.  COMPARISON:  Cardiac CT 04/23/2022  FINDINGS: There is a stable linear scar in the right lung base. No acute findings in the  lungs are lower mediastinum. The visualized upper abdomen and osseous structures are within normal limits.  IMPRESSION: No acute findings in the visualized extracardiac chest.   Electronically Signed By: Greig Pique M.D. On: 04/12/2023 23:33  Narrative CLINICAL DATA:  Chest pain  Chest pain  EXAM: Cardiac/Coronary CTA  TECHNIQUE: A non-contrast, gated CT scan was obtained with axial slices of 3 mm through the heart for calcium  scoring. Calcium  scoring was performed using the Agatston method. A 120 kV prospective, gated, contrast cardiac scan was obtained. Gantry rotation speed was 250 msecs and collimation was 0.6 mm. Two sublingual nitroglycerin  tablets (0.8 mg) were given. The 3D data set was reconstructed in 5% intervals of the 35-75% of the R-R cycle. Diastolic phases were analyzed on a dedicated workstation using MPR, MIP, and VRT modes. The patient received 95 cc of contrast.  FINDINGS: Image quality: Excellent.  Noise artifact is: Limited.  Coronary Arteries:  Normal coronary origin.  Right dominance.  Left main: The left main is a large caliber vessel with a normal take off from the left coronary cusp that trifurcates to form a left anterior descending artery, ramus, and a left circumflex artery. There is minimal calcified plaque (<25%).  Left anterior descending artery: The proximal LAD contains minimal calcified plaque (<25%). The mid and distal segments are patent. Mid LAD myocardial bridge. The LAD gives off 2 patent diagonal branches.  Ramus intermedius: Patent with no evidence of plaque or stenosis.  Left circumflex artery: The LCX is non-dominant and patent with no evidence of plaque or stenosis. The LCX gives off 2 patent obtuse marginal branches.  Right coronary artery: The RCA is dominant with normal take off from the right coronary cusp. There is minimal mixed density plaque (<25%). The RCA terminates as a PDA and right posterolateral  branch without evidence of plaque or stenosis.  Right Atrium: Right  atrial size is within normal limits.  Right Ventricle: The right ventricular cavity is within normal limits.  Left Atrium: Left atrial size is normal in size with no left atrial appendage filling defect. Small PFO.  Left Ventricle: The ventricular cavity size is within normal limits.  Pulmonary arteries: Normal in size.  Pulmonary veins: Normal pulmonary venous drainage.  Pericardium: Normal thickness without significant effusion or calcium  present.  Cardiac valves: The aortic valve is trileaflet without significant calcification. The mitral valve is normal with mild annular calcification.  Aorta: Normal caliber without significant disease.  Extra-cardiac findings: See attached radiology report for non-cardiac structures.  IMPRESSION: 1. Coronary calcium  score of 212. This was 56th percentile for age-, sex, and race-matched controls.  2. Total plaque volume 147 mm3 which is 20th percentile for age- and sex-matched controls (calcified plaque 46 mm3; non-calcified plaque 101 mm3; low attenuation plaque 7 mm3). TPV is moderate.  3. Normal coronary origin with right dominance.  4. Minimal calcified plaque (<25%) in the LAD and minimal mixed density plaque in the RCA (<25%).  5. Mid LAD myocardial bridge (normal variant).  RECOMMENDATIONS: 1. CAD-RADS 1: Minimal non-obstructive CAD (0-24%). Consider non-atherosclerotic causes of chest pain. Consider preventive therapy and risk factor modification.  Darryle Decent, MD  Electronically Signed: By: Darryle Decent M.D. On: 03/23/2023 18:08   CT SCANS  CT CARDIAC SCORING (SELF PAY ONLY) 04/23/2022  Addendum 04/23/2022  4:39 PM ADDENDUM REPORT: 04/23/2022 16:37  EXAM: OVER-READ INTERPRETATION  CT CHEST  The following report is an over-read performed by radiologist Dr. Waddell Cola [facility] on 04/23/2022. This over-read does not  include interpretation of cardiac or coronary anatomy or pathology. The coronary calcium  score interpretation by the cardiologist is attached.  COMPARISON:  None.  FINDINGS: Vascular: No acute abnormality.  Mediastinum/nodes: No mass or adenopathy identified.  Lungs/pleura: No pleural fluid or airspace disease. Scar like density identified within the posterior right base and anterior right middle lobe. No suspicious pulmonary nodule or mass identified.  Upper Abdomen: No acute abnormality.  Musculoskeletal: Multiple remote healed left lateral rib fractures are also noted. No acute or suspicious osseous findings. Remote appearing mid body of sternum fracture.  IMPRESSION: 1. No acute extracardiac findings. 2. Multiple remote healed left lateral rib fractures and remote appearing mid body of sternum fracture. 3. Scar like density identified within the posterior right base and anterior right middle lobe. No suspicious pulmonary nodule or mass identified.   Electronically Signed By: Waddell Calk M.D. On: 04/23/2022 16:37  Narrative CLINICAL DATA:  Cardiovascular Disease Risk stratification  EXAM: Coronary Calcium  Score  TECHNIQUE: A gated, non-contrast computed tomography scan of the heart was performed using 3mm slice thickness. Axial images were analyzed on a dedicated workstation. Calcium  scoring of the coronary arteries was performed using the Agatston method.  FINDINGS: Coronary arteries: Normal origins.  Coronary Calcium  Score:  Left main: 97  Left anterior descending artery: 82  Left circumflex artery: 0  Right coronary artery: 0  Total: 179  Percentile: 54  Pericardium: Normal.  Ascending Aorta: Normal caliber.  Aortic atherosclerosis.  Mitral annular calcification.  Non-cardiac: See separate report from [facility].  IMPRESSION: 1. Coronary calcium  score of 179. This was 29 percentile for age-, race-, and sex-matched  controls.  2.  Aortic atherosclerosis.  3.  Mitral annular calcification.  RECOMMENDATIONS: Coronary artery calcium  (CAC) score is a strong predictor of incident coronary heart disease (CHD) and provides predictive information beyond traditional risk factors. CAC scoring  is reasonable to use in the decision to withhold, postpone, or initiate statin therapy in intermediate-risk or selected borderline-risk asymptomatic adults (age 76-75 years and LDL-C >=70 to <190 mg/dL) who do not have diabetes or established atherosclerotic cardiovascular disease (ASCVD).* In intermediate-risk (10-year ASCVD risk >=7.5% to <20%) adults or selected borderline-risk (10-year ASCVD risk >=5% to <7.5%) adults in whom a CAC score is measured for the purpose of making a treatment decision the following recommendations have been made:  If CAC=0, it is reasonable to withhold statin therapy and reassess in 5 to 10 years, as long as higher risk conditions are absent (diabetes mellitus, family history of premature CHD in first degree relatives (males <55 years; females <65 years), cigarette smoking, or LDL >=190 mg/dL).  If CAC is 1 to 99, it is reasonable to initiate statin therapy for patients >=11 years of age.  If CAC is >=100 or >=75th percentile, it is reasonable to initiate statin therapy at any age.  Cardiology referral should be considered for patients with CAC scores >=400 or >=75th percentile.  *2018 AHA/ACC/AACVPR/AAPA/ABC/ACPM/ADA/AGS/APhA/ASPC/NLA/PCNA Guideline on the Management of Blood Cholesterol: A Report of the American College of Cardiology/American Heart Association Task Force on Clinical Practice Guidelines. J Am Coll Cardiol. 2019;73(24):3168-3209.  Electronically Signed: By: Oneil Parchment M.D. On: 04/23/2022 16:16     ______________________________________________________________________________________________       Current Reported Medications:.    Active  Medications[1]  Physical Exam:    VS:  LMP 07/07/1983    Wt Readings from Last 3 Encounters:  05/31/24 166 lb (75.3 kg)  05/14/24 155 lb 6.8 oz (70.5 kg)  04/29/24 180 lb 6.4 oz (81.8 kg)    GEN: Well nourished, well developed in no acute distress NECK: No JVD; No carotid bruits CARDIAC: ***RRR, no murmurs, rubs, gallops RESPIRATORY:  Clear to auscultation without rales, wheezing or rhonchi  ABDOMEN: Soft, non-tender, non-distended EXTREMITIES:  No edema; No acute deformity     Asessement and Plan:.     ***     Disposition: F/u with ***  Signed, Britainy Kozub D Aireonna Bauer, NP       [1]  No outpatient medications have been marked as taking for the 06/20/24 encounter (Appointment) with Rylan Kaufmann D, NP.   "

## 2024-06-20 ENCOUNTER — Ambulatory Visit: Admitting: Cardiology

## 2024-06-20 DIAGNOSIS — I2510 Atherosclerotic heart disease of native coronary artery without angina pectoris: Secondary | ICD-10-CM

## 2024-06-20 DIAGNOSIS — I5032 Chronic diastolic (congestive) heart failure: Secondary | ICD-10-CM

## 2024-06-20 DIAGNOSIS — I4819 Other persistent atrial fibrillation: Secondary | ICD-10-CM

## 2024-06-20 DIAGNOSIS — I10 Essential (primary) hypertension: Secondary | ICD-10-CM

## 2024-06-20 DIAGNOSIS — D6869 Other thrombophilia: Secondary | ICD-10-CM

## 2024-06-21 ENCOUNTER — Ambulatory Visit: Admitting: Physical Therapy

## 2024-06-23 ENCOUNTER — Encounter: Payer: Self-pay | Admitting: Physical Therapy

## 2024-06-23 ENCOUNTER — Ambulatory Visit: Admitting: Physical Therapy

## 2024-06-23 DIAGNOSIS — M6281 Muscle weakness (generalized): Secondary | ICD-10-CM

## 2024-06-23 DIAGNOSIS — R293 Abnormal posture: Secondary | ICD-10-CM

## 2024-06-23 NOTE — Therapy (Signed)
 " OUTPATIENT PHYSICAL THERAPY LOWER EXTREMITY TREATMENT   Patient Name: Carla Petersen MRN: [id number] DOB:1940/08/20, 84 y.o., female Today's Date: 06/23/2024  END OF SESSION:  PT End of Session - 06/23/24 1552     Visit Number 7    Date for Recertification  07/18/24    Authorization Type Medicare/[employer]    Progress Note Due on Visit 10    PT Start Time 1452    PT Stop Time 1531    PT Time Calculation (min) 39 min    Equipment Utilized During Treatment Oxygen    Behavior During Therapy WFL for tasks assessed/performed               Past Medical History:  Diagnosis Date   CHF (congestive heart failure) (HCC)    Graves disease    Personal history of radiation therapy    Left Breast Cancer   Past Surgical History:  Procedure Laterality Date   BREAST EXCISIONAL BIOPSY Left    BREAST EXCISIONAL BIOPSY Left    BREAST LUMPECTOMY Left 1995   CARDIOVERSION N/A 05/05/2024   Procedure: CARDIOVERSION;  Surgeon: Santo Stanly LABOR, MD;  Location: [facility];  Service: Cardiovascular;  Laterality: N/A;   MASTECTOMY Left    PR THORACENTESIS NEEDLE/CATH PLEURA W/IMAGING  05/01/2024   TRANSESOPHAGEAL ECHOCARDIOGRAM (CATH LAB) N/A 05/05/2024   Procedure: TRANSESOPHAGEAL ECHOCARDIOGRAM;  Surgeon: Santo Stanly LABOR, MD;  Location: [facility];  Service: Cardiovascular;  Laterality: N/A;   Patient Active Problem List   Diagnosis Date Noted   Long term (current) use of anticoagulants 05/09/2024   Cough 05/09/2024   Long term current use of antiarrhythmic drug 05/08/2024   History of cardioversion 05/07/2024   Mixed hyperlipidemia 05/07/2024   Acute on chronic heart failure with preserved ejection fraction (HFpEF) (HCC) 05/07/2024   Hypokalemia 05/05/2024   Hypothyroidism 05/05/2024   Hypertension 05/05/2024   Pressure injury of skin 05/05/2024   Persistent atrial fibrillation (HCC) 05/05/2024   Prolonged QT interval 05/01/2024   Acute on chronic diastolic  heart failure (HCC) 87/86/7974   Acute respiratory failure with hypoxia (HCC) 04/30/2024   Atrial fibrillation with rapid ventricular response (HCC) 04/30/2024   Acute exacerbation of CHF (congestive heart failure) (HCC) 04/29/2024   Anticoagulant long-term use 03/27/2022   Otorrhea, right 03/27/2022   Sleep apnea in adult 12/18/2019   Hernia, inguinal 11/01/2019   Racing heart beat 09/17/2019   Laceration of right thumb without foreign body without damage to nail 06/03/2017   H/O mastoidectomy 08/02/2015   Mastoiditis of right side 08/02/2015   S/P hip replacement 10/03/2014   Status post total replacement of left hip 10/03/2014   Multiple traumatic injuries 07/17/2014    PCP: Ransom Other, MD   REFERRING PROVIDER: Perri LABOR Meliton Mickey., MD  REFERRING DIAG: I50.33 (ICD-10-CM) - Acute on chronic diastolic congestive heart failure (HCC) I50.33 (ICD-10-CM) - Acute on chronic diastolic heart failure (HCC)  THERAPY DIAG:  Muscle weakness (generalized)  Abnormal posture  Rationale for Evaluation and Treatment: Rehabilitation  ONSET DATE: Chronic flare up November 2025  SUBJECTIVE:   SUBJECTIVE STATEMENT: Patient reports she is doing okay today. She was tired after last treatment session.  PERTINENT HISTORY: CHF; Graves Disease; Left breast lumpectomy with radiation; Hx Rt hip replacement; HTN PAIN:  Are you having pain? No  PRECAUTIONS: None  RED FLAGS: None   WEIGHT BEARING RESTRICTIONS: No  FALLS:  Has patient fallen in last 6 months? No  LIVING ENVIRONMENT: Lives with: lives with  their spouse Lives in: House/apartment Stairs: No Has following equipment at home: Single point cane, Walker - 2 wheeled, Grab bars, and Ramped entry only uses cane in the car for when she goes to her daughter's house she has two steps to enter  OCCUPATION: Retired  PLOF: Independent, Independent with basic ADLs, Independent with household mobility without device, Independent with  community mobility without device, Independent with gait, Independent with transfers, and Leisure: sewing and knitting, reading  PATIENT GOALS: I don't have any goals  NEXT MD VISIT: Cardiologist Feb 2nd  OBJECTIVE:  Note: Objective measures were completed at Evaluation unless otherwise noted.  DIAGNOSTIC FINDINGS: None  PATIENT SURVEYS:  ABC scale: The Activities-Specific Balance Confidence (ABC) Scale: 66.25% moderate level of physical functioning   COGNITION: Overall cognitive status: Within functional limits for tasks assessed      POSTURE: rounded shoulders, forward head, flexed trunk , and bilateral flexed knees    LOWER EXTREMITY ROM: WFL bilateral      LOWER EXTREMITY MMT:  MMT Right eval Left eval  Hip flexion 4 4-  Hip extension    Hip abduction 4 4-  Hip adduction    Hip internal rotation    Hip external rotation    Knee flexion 4 4  Knee extension 4 4  Ankle dorsiflexion    Ankle plantarflexion    Ankle inversion    Ankle eversion     (Blank rows = not tested)   FUNCTIONAL TESTS:  5 times sit to stand: 14.51 sec with UE support; uncontrolled descent Timed up and go (TUG): 20.74sec  06/16/2024 : 667FT with FWW RPE 1/10  GAIT: Distance walked: 59ft Assistive device utilized: Environmental Consultant - 2 wheeled Comments: Decreased cadence, bilateral flexed knees, increased trunk flexion                                                                                                                                TREATMENT DATE:  06/23/24 O2 already at 4L Functional walking around treatment gyms 5 secs RPE 5/10 O2 92% HR 97 NuStep L3 x 5 - PT present to discuss goal for session Seated shoulder row with red TB 2 x 10 Seated alt hand and knee press with purple ball x 10 bilateral  Sit to stand x 12 6 inch step taps x 20 (no UE support) Standing heel raise x 20 Standing hip abduction 2 x 10 bilateral  Standing March x 20 Seated shoulder extension with  red loop x 10 bilateral  RPE 3-4/10 O2 91 HR 96     06/16/24 O2 already at 4L Baseline 02 sat 95%, HR 94 bpm O2 98 % HR 90 NuStep L4 x 6' (HR up to 86, O2 sat 98%)- decreased speed towards the end Seated biceps curl # DB 2 x 10 Standing row and extension with red TB 2 x 10 Hurdles (forwards & sideways) x 3 laps each unilateral UE support at  barre 6in step ups with unilateral UE at barre x 10 bilateral  HR 110 then 98; O2 94% Sit to stand x 12 with UE support    06/09/24 O2 already at 4L Baseline 02 sat 95%, HR 101-95 bpm NuStep L4 x 5' (HR up to 91, O2 sat 95%) Standing heel raise x 20 at barre Standing march x 20 total at barre Standing hip abduction x 12 bilateral at barre Standing hip extension x 12 bilateral at barre Stats: O2 97 HR 100 Sit to stand x 10 with UE support Standing 6 inch step tap no UE support x 20 3 laps around both treatment gyms with RW (patient was fatigued after this) Stats: HR 106 O2 91% Seated red tband bil row 2x10 Seated biceps curl # DB 2 x 10 RPE 4/10   PATIENT EDUCATION:  Education details: PT eval findings, anticipated POC, progress with PT, and initial HEP  Person educated: Patient Education method: Explanation, Demonstration, and Handouts Education comprehension: verbalized understanding, returned demonstration, and needs further education  HOME EXERCISE PROGRAM: Access Code: [email or web address] URL: [email or web address] Date: 05/23/2024 Prepared by: Kristeen Sar  Exercises - Heel Raises with Counter Support  - 1 x daily - 7 x weekly - 1-2 sets - 10 reps - Standing March with Counter Support  - 1 x daily - 7 x weekly - 1 sets - 10 reps - Sit to Stand with Armchair  - 1 x daily - 7 x weekly - 2 sets - 5 reps - Seated Long Arc Quad  - 1 x daily - 7 x weekly - 1 sets - 10 reps - 3-4 hold  ASSESSMENT:  CLINICAL IMPRESSION: Patient verbalized increased fatigue after last session. Slightly decreased exercise intensity  today because of this. She was able to participate in functional walking around the clinic for 9 minutes. This is the longest she has walked since starting skilled therapy. PT monitored patient's vitals as needed to make sure HR stayed below 130 bpm. Overall, she tolerated treatment session well. PT provided cues as needed for correct exercise performance. At end of session she verbalized some fatigue. RPE 3-4/10.     OBJECTIVE IMPAIRMENTS: Abnormal gait, cardiopulmonary status limiting activity, decreased activity tolerance, decreased balance, decreased endurance, decreased mobility, difficulty walking, decreased ROM, decreased strength, decreased safety awareness, hypomobility, impaired flexibility, and postural dysfunction.   ACTIVITY LIMITATIONS: lifting, bending, squatting, stairs, reach over head, hygiene/grooming, and locomotion level  PARTICIPATION LIMITATIONS: meal prep, cleaning, laundry, interpersonal relationship, shopping, and community activity  PERSONAL FACTORS: Age, Fitness, and 3+ comorbidities: CHF; Graves Disease; Left breast lumpectomy with radiation; HTN are also affecting patient's functional outcome.   REHAB POTENTIAL: Good  CLINICAL DECISION MAKING: Evolving/moderate complexity  EVALUATION COMPLEXITY: Moderate   GOALS: Goals reviewed with patient? Yes  SHORT TERM GOALS: Target date: 06/20/2024  Patient will be independent with initial HEP. Baseline:  Goal status: INITIAL  2.  Patient will report > or = to 30% improvement in ability to perform functional activities (laundry, cooking, and bathing )since starting PT. Baseline:  Goal status: INITIAL   3.  Patient will be able to participate in a to establish baseline for test. Baseline:  Goal status: INITIAL  4.  Patient will demonstrate control when performing sit to stand due to improved LE strength. Baseline:  Goal status: INITIAL    LONG TERM GOALS: Target date: 07/18/2024  Patient will  demonstrate independence in advanced HEP. Baseline:  Goal status: INITIAL  2.  Patient will report >  or = to 60% improvement in ability to perform functional activities (laundry, cooking, and bathing )since starting PT. Baseline:  Goal status: INITIAL  3.  Patient will perform 5STS in < or = to 11 sec due to improved functional mobility and LE strength. Baseline: 14.51 Goal status: INITIAL   4.  Patient will perform TUG in < or = to 15 sec to decrease falls risk. Baseline: 20.74sec Goal status: INITIAL  5.  Patient will ambulate 50-60% of age expected distance during to improve community negotiation. Baseline:  Goal status: INITIAL     PLAN:  PT FREQUENCY: 1-2x/week  PT DURATION: 8 weeks  PLANNED INTERVENTIONS: 97164- PT Re-evaluation, 97110-Therapeutic exercises, 97530- Therapeutic activity, 97112- Neuromuscular re-education, 97535- Self Care, [id number]- Manual therapy, [phone]- Gait training, [phone]- Canalith repositioning, [id number]- Aquatic Therapy, [phone]- Electrical stimulation (unattended), [phone]- Electrical stimulation (manual), [id number]- Vasopneumatic device, [id number]- Ultrasound, [id number]- Traction (mechanical), [id number]- Ionotophoresis 4mg /ml Dexamethasone, [id number] (1-2 muscles), [id number] (3+ muscles)- Dry Needling, Patient/Family education, Balance training, Stair training, Joint mobilization, Joint manipulation, Spinal manipulation, Spinal mobilization, Vestibular training, Cryotherapy, and Moist heat  PLAN FOR NEXT SESSION: see how she did after session; step ups; single leg balance; Monitor HR at all times, standing tolerance; LE strengthening and balance    Kristeen Sar, PT, DPT 06/23/24 3:53 PM [facility] [address], [address] [place], [place] [address] Phone # [phone] Fax [phone]   "

## 2024-06-28 ENCOUNTER — Ambulatory Visit: Admitting: Physical Therapy

## 2024-06-30 ENCOUNTER — Ambulatory Visit: Admitting: Physical Therapy

## 2024-07-05 ENCOUNTER — Ambulatory Visit: Admitting: Physical Therapy

## 2024-07-06 ENCOUNTER — Ambulatory Visit

## 2024-07-07 ENCOUNTER — Ambulatory Visit: Admitting: Physical Therapy

## 2024-07-12 ENCOUNTER — Ambulatory Visit: Admitting: Physical Therapy

## 2024-07-14 ENCOUNTER — Ambulatory Visit: Admitting: Physical Therapy

## 2024-07-18 ENCOUNTER — Ambulatory Visit: Admitting: Physical Therapy

## 2024-08-10 ENCOUNTER — Ambulatory Visit: Admitting: Cardiology

## 2024-08-29 ENCOUNTER — Ambulatory Visit (HOSPITAL_COMMUNITY): Admitting: Physician Assistant
